# Patient Record
Sex: Male | Born: 1945
Health system: Southern US, Community
[De-identification: ages and names within clinical notes are randomized; demographics above are authoritative.]

## PROBLEM LIST (undated history)

## (undated) DIAGNOSIS — H269 Unspecified cataract: Secondary | ICD-10-CM

## (undated) DIAGNOSIS — J189 Pneumonia, unspecified organism: Secondary | ICD-10-CM

## (undated) DIAGNOSIS — M1990 Unspecified osteoarthritis, unspecified site: Secondary | ICD-10-CM

## (undated) DIAGNOSIS — C4442 Squamous cell carcinoma of skin of scalp and neck: Secondary | ICD-10-CM

## (undated) DIAGNOSIS — I10 Essential (primary) hypertension: Secondary | ICD-10-CM

## (undated) DIAGNOSIS — J449 Chronic obstructive pulmonary disease, unspecified: Secondary | ICD-10-CM

## (undated) DIAGNOSIS — R011 Cardiac murmur, unspecified: Secondary | ICD-10-CM

## (undated) DIAGNOSIS — K219 Gastro-esophageal reflux disease without esophagitis: Secondary | ICD-10-CM

## (undated) DIAGNOSIS — E7800 Pure hypercholesterolemia, unspecified: Secondary | ICD-10-CM

## (undated) DIAGNOSIS — I38 Endocarditis, valve unspecified: Secondary | ICD-10-CM

## (undated) DIAGNOSIS — K429 Umbilical hernia without obstruction or gangrene: Secondary | ICD-10-CM

## (undated) DIAGNOSIS — G4730 Sleep apnea, unspecified: Secondary | ICD-10-CM

## (undated) DIAGNOSIS — C801 Malignant (primary) neoplasm, unspecified: Secondary | ICD-10-CM

## (undated) DIAGNOSIS — I5189 Other ill-defined heart diseases: Secondary | ICD-10-CM

## (undated) DIAGNOSIS — I351 Nonrheumatic aortic (valve) insufficiency: Secondary | ICD-10-CM

## (undated) DIAGNOSIS — N400 Enlarged prostate without lower urinary tract symptoms: Secondary | ICD-10-CM

## (undated) HISTORY — DX: Nonrheumatic aortic (valve) insufficiency: I35.1

## (undated) HISTORY — PX: INGUINAL HERNIA REPAIR: SUR1180

## (undated) HISTORY — PX: UMBILICAL HERNIA REPAIR: SHX196

## (undated) HISTORY — PX: EYE SURGERY: SHX253

## (undated) HISTORY — PX: TONSILLECTOMY: SUR1361

---

## 2007-12-21 DIAGNOSIS — I38 Endocarditis, valve unspecified: Secondary | ICD-10-CM

## 2007-12-21 HISTORY — DX: Endocarditis, valve unspecified: I38

## 2012-07-24 DIAGNOSIS — Z1389 Encounter for screening for other disorder: Secondary | ICD-10-CM | POA: Diagnosis not present

## 2012-07-24 DIAGNOSIS — L578 Other skin changes due to chronic exposure to nonionizing radiation: Secondary | ICD-10-CM | POA: Diagnosis not present

## 2012-07-24 DIAGNOSIS — L570 Actinic keratosis: Secondary | ICD-10-CM | POA: Diagnosis not present

## 2012-07-24 DIAGNOSIS — L820 Inflamed seborrheic keratosis: Secondary | ICD-10-CM | POA: Diagnosis not present

## 2012-09-14 DIAGNOSIS — R972 Elevated prostate specific antigen [PSA]: Secondary | ICD-10-CM | POA: Diagnosis not present

## 2012-09-20 DIAGNOSIS — R972 Elevated prostate specific antigen [PSA]: Secondary | ICD-10-CM | POA: Diagnosis not present

## 2012-09-20 DIAGNOSIS — I359 Nonrheumatic aortic valve disorder, unspecified: Secondary | ICD-10-CM | POA: Diagnosis not present

## 2012-09-20 DIAGNOSIS — Z23 Encounter for immunization: Secondary | ICD-10-CM | POA: Diagnosis not present

## 2012-09-20 DIAGNOSIS — E785 Hyperlipidemia, unspecified: Secondary | ICD-10-CM | POA: Diagnosis not present

## 2012-09-20 DIAGNOSIS — N400 Enlarged prostate without lower urinary tract symptoms: Secondary | ICD-10-CM | POA: Diagnosis not present

## 2012-10-09 DIAGNOSIS — I359 Nonrheumatic aortic valve disorder, unspecified: Secondary | ICD-10-CM | POA: Diagnosis not present

## 2012-10-09 DIAGNOSIS — Z23 Encounter for immunization: Secondary | ICD-10-CM | POA: Diagnosis not present

## 2012-10-09 DIAGNOSIS — E7800 Pure hypercholesterolemia, unspecified: Secondary | ICD-10-CM | POA: Diagnosis not present

## 2012-10-09 DIAGNOSIS — I10 Essential (primary) hypertension: Secondary | ICD-10-CM | POA: Diagnosis not present

## 2012-10-09 DIAGNOSIS — R972 Elevated prostate specific antigen [PSA]: Secondary | ICD-10-CM | POA: Diagnosis not present

## 2012-10-10 DIAGNOSIS — I359 Nonrheumatic aortic valve disorder, unspecified: Secondary | ICD-10-CM | POA: Diagnosis not present

## 2012-10-10 DIAGNOSIS — I10 Essential (primary) hypertension: Secondary | ICD-10-CM | POA: Diagnosis not present

## 2012-10-10 DIAGNOSIS — E7800 Pure hypercholesterolemia, unspecified: Secondary | ICD-10-CM | POA: Diagnosis not present

## 2012-10-10 DIAGNOSIS — R972 Elevated prostate specific antigen [PSA]: Secondary | ICD-10-CM | POA: Diagnosis not present

## 2012-10-20 DIAGNOSIS — R0989 Other specified symptoms and signs involving the circulatory and respiratory systems: Secondary | ICD-10-CM | POA: Diagnosis not present

## 2012-11-21 DIAGNOSIS — R609 Edema, unspecified: Secondary | ICD-10-CM | POA: Diagnosis not present

## 2012-11-21 DIAGNOSIS — I369 Nonrheumatic tricuspid valve disorder, unspecified: Secondary | ICD-10-CM | POA: Diagnosis not present

## 2012-11-21 DIAGNOSIS — R079 Chest pain, unspecified: Secondary | ICD-10-CM | POA: Diagnosis not present

## 2012-11-21 DIAGNOSIS — I059 Rheumatic mitral valve disease, unspecified: Secondary | ICD-10-CM | POA: Diagnosis not present

## 2012-11-21 DIAGNOSIS — I061 Rheumatic aortic insufficiency: Secondary | ICD-10-CM | POA: Diagnosis not present

## 2012-12-08 DIAGNOSIS — R972 Elevated prostate specific antigen [PSA]: Secondary | ICD-10-CM | POA: Diagnosis not present

## 2012-12-28 DIAGNOSIS — J342 Deviated nasal septum: Secondary | ICD-10-CM | POA: Diagnosis not present

## 2012-12-28 DIAGNOSIS — J343 Hypertrophy of nasal turbinates: Secondary | ICD-10-CM | POA: Diagnosis not present

## 2012-12-28 DIAGNOSIS — G4733 Obstructive sleep apnea (adult) (pediatric): Secondary | ICD-10-CM | POA: Diagnosis not present

## 2012-12-28 DIAGNOSIS — G4710 Hypersomnia, unspecified: Secondary | ICD-10-CM | POA: Diagnosis not present

## 2012-12-28 DIAGNOSIS — R0682 Tachypnea, not elsewhere classified: Secondary | ICD-10-CM | POA: Diagnosis not present

## 2013-01-02 DIAGNOSIS — N401 Enlarged prostate with lower urinary tract symptoms: Secondary | ICD-10-CM | POA: Diagnosis not present

## 2013-01-02 DIAGNOSIS — R972 Elevated prostate specific antigen [PSA]: Secondary | ICD-10-CM | POA: Diagnosis not present

## 2013-01-04 DIAGNOSIS — G4710 Hypersomnia, unspecified: Secondary | ICD-10-CM | POA: Diagnosis not present

## 2013-01-04 DIAGNOSIS — G4730 Sleep apnea, unspecified: Secondary | ICD-10-CM | POA: Diagnosis not present

## 2013-01-09 DIAGNOSIS — N400 Enlarged prostate without lower urinary tract symptoms: Secondary | ICD-10-CM | POA: Diagnosis not present

## 2013-01-09 DIAGNOSIS — I10 Essential (primary) hypertension: Secondary | ICD-10-CM | POA: Diagnosis not present

## 2013-01-09 DIAGNOSIS — G4739 Other sleep apnea: Secondary | ICD-10-CM | POA: Diagnosis not present

## 2013-01-09 DIAGNOSIS — I38 Endocarditis, valve unspecified: Secondary | ICD-10-CM | POA: Diagnosis not present

## 2013-01-09 DIAGNOSIS — J342 Deviated nasal septum: Secondary | ICD-10-CM | POA: Diagnosis not present

## 2013-01-09 DIAGNOSIS — I359 Nonrheumatic aortic valve disorder, unspecified: Secondary | ICD-10-CM | POA: Diagnosis not present

## 2013-01-23 DIAGNOSIS — G4733 Obstructive sleep apnea (adult) (pediatric): Secondary | ICD-10-CM | POA: Diagnosis not present

## 2013-01-23 DIAGNOSIS — J342 Deviated nasal septum: Secondary | ICD-10-CM | POA: Diagnosis not present

## 2013-01-30 DIAGNOSIS — Z1389 Encounter for screening for other disorder: Secondary | ICD-10-CM | POA: Diagnosis not present

## 2013-01-30 DIAGNOSIS — L570 Actinic keratosis: Secondary | ICD-10-CM | POA: Diagnosis not present

## 2013-01-30 DIAGNOSIS — G4739 Other sleep apnea: Secondary | ICD-10-CM | POA: Diagnosis not present

## 2013-01-30 DIAGNOSIS — J342 Deviated nasal septum: Secondary | ICD-10-CM | POA: Diagnosis not present

## 2013-01-30 DIAGNOSIS — L578 Other skin changes due to chronic exposure to nonionizing radiation: Secondary | ICD-10-CM | POA: Diagnosis not present

## 2013-02-26 DIAGNOSIS — J342 Deviated nasal septum: Secondary | ICD-10-CM | POA: Diagnosis not present

## 2013-02-26 DIAGNOSIS — J343 Hypertrophy of nasal turbinates: Secondary | ICD-10-CM | POA: Diagnosis not present

## 2013-02-26 DIAGNOSIS — G4730 Sleep apnea, unspecified: Secondary | ICD-10-CM | POA: Diagnosis not present

## 2013-03-30 DIAGNOSIS — IMO0002 Reserved for concepts with insufficient information to code with codable children: Secondary | ICD-10-CM | POA: Diagnosis not present

## 2013-03-30 DIAGNOSIS — R972 Elevated prostate specific antigen [PSA]: Secondary | ICD-10-CM | POA: Diagnosis not present

## 2013-04-10 DIAGNOSIS — E7800 Pure hypercholesterolemia, unspecified: Secondary | ICD-10-CM | POA: Diagnosis not present

## 2013-04-10 DIAGNOSIS — I359 Nonrheumatic aortic valve disorder, unspecified: Secondary | ICD-10-CM | POA: Diagnosis not present

## 2013-04-10 DIAGNOSIS — I10 Essential (primary) hypertension: Secondary | ICD-10-CM | POA: Diagnosis not present

## 2013-04-10 DIAGNOSIS — Z8679 Personal history of other diseases of the circulatory system: Secondary | ICD-10-CM | POA: Diagnosis not present

## 2013-04-16 DIAGNOSIS — R0989 Other specified symptoms and signs involving the circulatory and respiratory systems: Secondary | ICD-10-CM | POA: Diagnosis not present

## 2013-04-16 DIAGNOSIS — D485 Neoplasm of uncertain behavior of skin: Secondary | ICD-10-CM | POA: Diagnosis not present

## 2013-04-16 DIAGNOSIS — I359 Nonrheumatic aortic valve disorder, unspecified: Secondary | ICD-10-CM | POA: Diagnosis not present

## 2013-04-16 DIAGNOSIS — I10 Essential (primary) hypertension: Secondary | ICD-10-CM | POA: Diagnosis not present

## 2013-04-16 DIAGNOSIS — R0609 Other forms of dyspnea: Secondary | ICD-10-CM | POA: Diagnosis not present

## 2013-04-16 DIAGNOSIS — L570 Actinic keratosis: Secondary | ICD-10-CM | POA: Diagnosis not present

## 2013-04-16 DIAGNOSIS — E7800 Pure hypercholesterolemia, unspecified: Secondary | ICD-10-CM | POA: Diagnosis not present

## 2013-04-16 DIAGNOSIS — Z8679 Personal history of other diseases of the circulatory system: Secondary | ICD-10-CM | POA: Diagnosis not present

## 2013-04-20 DIAGNOSIS — G4733 Obstructive sleep apnea (adult) (pediatric): Secondary | ICD-10-CM | POA: Diagnosis not present

## 2013-04-26 DIAGNOSIS — I38 Endocarditis, valve unspecified: Secondary | ICD-10-CM | POA: Diagnosis not present

## 2013-04-26 DIAGNOSIS — Z6379 Other stressful life events affecting family and household: Secondary | ICD-10-CM | POA: Diagnosis not present

## 2013-04-26 DIAGNOSIS — R972 Elevated prostate specific antigen [PSA]: Secondary | ICD-10-CM | POA: Diagnosis not present

## 2013-04-26 DIAGNOSIS — I10 Essential (primary) hypertension: Secondary | ICD-10-CM | POA: Diagnosis not present

## 2013-04-26 DIAGNOSIS — I359 Nonrheumatic aortic valve disorder, unspecified: Secondary | ICD-10-CM | POA: Diagnosis not present

## 2013-04-26 DIAGNOSIS — G4739 Other sleep apnea: Secondary | ICD-10-CM | POA: Diagnosis not present

## 2013-04-26 DIAGNOSIS — E785 Hyperlipidemia, unspecified: Secondary | ICD-10-CM | POA: Diagnosis not present

## 2013-05-02 DIAGNOSIS — L570 Actinic keratosis: Secondary | ICD-10-CM | POA: Diagnosis not present

## 2013-05-02 DIAGNOSIS — L259 Unspecified contact dermatitis, unspecified cause: Secondary | ICD-10-CM | POA: Diagnosis not present

## 2013-05-02 DIAGNOSIS — L821 Other seborrheic keratosis: Secondary | ICD-10-CM | POA: Diagnosis not present

## 2013-05-02 DIAGNOSIS — D485 Neoplasm of uncertain behavior of skin: Secondary | ICD-10-CM | POA: Diagnosis not present

## 2013-05-10 DIAGNOSIS — L570 Actinic keratosis: Secondary | ICD-10-CM | POA: Diagnosis not present

## 2013-05-17 DIAGNOSIS — D239 Other benign neoplasm of skin, unspecified: Secondary | ICD-10-CM | POA: Diagnosis not present

## 2013-07-02 DIAGNOSIS — L989 Disorder of the skin and subcutaneous tissue, unspecified: Secondary | ICD-10-CM | POA: Diagnosis not present

## 2013-07-02 DIAGNOSIS — M6281 Muscle weakness (generalized): Secondary | ICD-10-CM | POA: Diagnosis not present

## 2013-08-16 ENCOUNTER — Inpatient Hospital Stay (HOSPITAL_COMMUNITY): Payer: Medicare Other

## 2013-08-16 ENCOUNTER — Emergency Department (HOSPITAL_COMMUNITY): Payer: Medicare Other

## 2013-08-16 ENCOUNTER — Encounter (HOSPITAL_COMMUNITY): Payer: Self-pay | Admitting: *Deleted

## 2013-08-16 ENCOUNTER — Inpatient Hospital Stay (HOSPITAL_COMMUNITY)
Admission: EM | Admit: 2013-08-16 | Discharge: 2013-08-19 | DRG: 289 | Disposition: A | Discharge status: Home Health | Payer: Medicare Other | Attending: Internal Medicine | Admitting: Internal Medicine

## 2013-08-16 DIAGNOSIS — I10 Essential (primary) hypertension: Secondary | ICD-10-CM | POA: Diagnosis present

## 2013-08-16 DIAGNOSIS — N400 Enlarged prostate without lower urinary tract symptoms: Secondary | ICD-10-CM | POA: Diagnosis present

## 2013-08-16 DIAGNOSIS — I38 Endocarditis, valve unspecified: Secondary | ICD-10-CM | POA: Diagnosis not present

## 2013-08-16 DIAGNOSIS — G4733 Obstructive sleep apnea (adult) (pediatric): Secondary | ICD-10-CM | POA: Diagnosis present

## 2013-08-16 DIAGNOSIS — R059 Cough, unspecified: Secondary | ICD-10-CM | POA: Diagnosis not present

## 2013-08-16 DIAGNOSIS — R61 Generalized hyperhidrosis: Secondary | ICD-10-CM | POA: Diagnosis not present

## 2013-08-16 DIAGNOSIS — E785 Hyperlipidemia, unspecified: Secondary | ICD-10-CM | POA: Diagnosis present

## 2013-08-16 DIAGNOSIS — R5381 Other malaise: Secondary | ICD-10-CM | POA: Diagnosis not present

## 2013-08-16 DIAGNOSIS — I359 Nonrheumatic aortic valve disorder, unspecified: Secondary | ICD-10-CM | POA: Diagnosis present

## 2013-08-16 DIAGNOSIS — R509 Fever, unspecified: Secondary | ICD-10-CM | POA: Diagnosis not present

## 2013-08-16 DIAGNOSIS — I330 Acute and subacute infective endocarditis: Principal | ICD-10-CM | POA: Diagnosis present

## 2013-08-16 DIAGNOSIS — R05 Cough: Secondary | ICD-10-CM | POA: Diagnosis not present

## 2013-08-16 DIAGNOSIS — E7800 Pure hypercholesterolemia, unspecified: Secondary | ICD-10-CM | POA: Diagnosis present

## 2013-08-16 DIAGNOSIS — Z79899 Other long term (current) drug therapy: Secondary | ICD-10-CM | POA: Diagnosis not present

## 2013-08-16 DIAGNOSIS — R5383 Other fatigue: Secondary | ICD-10-CM

## 2013-08-16 DIAGNOSIS — I351 Nonrheumatic aortic (valve) insufficiency: Secondary | ICD-10-CM | POA: Diagnosis present

## 2013-08-16 DIAGNOSIS — R6884 Jaw pain: Secondary | ICD-10-CM | POA: Diagnosis not present

## 2013-08-16 HISTORY — DX: Essential (primary) hypertension: I10

## 2013-08-16 HISTORY — DX: Benign prostatic hyperplasia without lower urinary tract symptoms: N40.0

## 2013-08-16 HISTORY — DX: Endocarditis, valve unspecified: I38

## 2013-08-16 HISTORY — DX: Cardiac murmur, unspecified: R01.1

## 2013-08-16 HISTORY — DX: Pure hypercholesterolemia, unspecified: E78.00

## 2013-08-16 HISTORY — DX: Pneumonia, unspecified organism: J18.9

## 2013-08-16 HISTORY — DX: Sleep apnea, unspecified: G47.30

## 2013-08-16 LAB — COMPREHENSIVE METABOLIC PANEL
ALT: 26 U/L (ref 0–53)
AST: 26 U/L (ref 0–37)
Albumin: 4.1 g/dL (ref 3.5–5.2)
Alkaline Phosphatase: 46 U/L (ref 39–117)
BUN: 21 mg/dL (ref 6–23)
Chloride: 98 mEq/L (ref 96–112)
Potassium: 3.6 mEq/L (ref 3.5–5.1)
Sodium: 136 mEq/L (ref 135–145)
Total Bilirubin: 1.2 mg/dL (ref 0.3–1.2)
Total Protein: 7.1 g/dL (ref 6.0–8.3)

## 2013-08-16 LAB — URINALYSIS, ROUTINE W REFLEX MICROSCOPIC
Bilirubin Urine: NEGATIVE
Glucose, UA: NEGATIVE mg/dL
Hgb urine dipstick: NEGATIVE
Ketones, ur: NEGATIVE mg/dL
Leukocytes, UA: NEGATIVE
Protein, ur: NEGATIVE mg/dL
pH: 6 (ref 5.0–8.0)

## 2013-08-16 LAB — C-REACTIVE PROTEIN: CRP: 5.3 mg/dL — ABNORMAL HIGH (ref <0.60)

## 2013-08-16 LAB — CREATININE, SERUM
Creatinine, Ser: 1.26 mg/dL (ref 0.50–1.35)
GFR calc Af Amer: 67 mL/min — ABNORMAL LOW (ref >90)
GFR calc non Af Amer: 58 mL/min — ABNORMAL LOW (ref >90)

## 2013-08-16 LAB — CBC WITH DIFFERENTIAL/PLATELET
Basophils Relative: 0 % (ref 0–1)
Hemoglobin: 16.1 g/dL (ref 13.0–17.0)
MCHC: 36.9 g/dL — ABNORMAL HIGH (ref 30.0–36.0)
Monocytes Relative: 10 % (ref 3–12)
Neutro Abs: 7.8 10*3/uL — ABNORMAL HIGH (ref 1.7–7.7)
Neutrophils Relative %: 83 % — ABNORMAL HIGH (ref 43–77)
Platelets: 113 10*3/uL — ABNORMAL LOW (ref 150–400)
RBC: 4.86 MIL/uL (ref 4.22–5.81)

## 2013-08-16 LAB — PROTIME-INR
INR: 1.05 (ref 0.00–1.49)
Prothrombin Time: 13.5 seconds (ref 11.6–15.2)

## 2013-08-16 LAB — SEDIMENTATION RATE: Sed Rate: 18 mm/hr — ABNORMAL HIGH (ref 0–16)

## 2013-08-16 LAB — CBC
HCT: 40.4 % (ref 39.0–52.0)
Hemoglobin: 14.1 g/dL (ref 13.0–17.0)
MCHC: 34.9 g/dL (ref 30.0–36.0)
RBC: 4.49 MIL/uL (ref 4.22–5.81)
WBC: 6.8 10*3/uL (ref 4.0–10.5)

## 2013-08-16 LAB — RHEUMATOID FACTOR: Rhuematoid fact SerPl-aCnc: 21 IU/mL — ABNORMAL HIGH (ref <=14)

## 2013-08-16 LAB — TROPONIN I: Troponin I: <0.3 ng/mL (ref <0.30)

## 2013-08-16 MED ORDER — POLYETHYLENE GLYCOL 3350 17 G PO PACK
17.0000 g | PACK | Freq: Every day | ORAL | Status: DC | PRN
  Filled 2013-08-16: qty 1

## 2013-08-16 MED ORDER — HEPARIN SODIUM (PORCINE) 5000 UNIT/ML IJ SOLN
5000.0000 [IU] | Freq: Three times a day (TID) | INTRAMUSCULAR | Status: DC
  Administered 2013-08-16 – 2013-08-19 (×8): 5000 [IU] via SUBCUTANEOUS
  Filled 2013-08-16 (×12): qty 1

## 2013-08-16 MED ORDER — ACETAMINOPHEN 325 MG PO TABS
650.0000 mg | ORAL_TABLET | Freq: Four times a day (QID) | ORAL | Status: DC | PRN
  Administered 2013-08-16 – 2013-08-19 (×3): 650 mg via ORAL
  Filled 2013-08-16 (×3): qty 2

## 2013-08-16 MED ORDER — IRBESARTAN 150 MG PO TABS
150.0000 mg | ORAL_TABLET | Freq: Every day | ORAL | Status: DC
  Administered 2013-08-17 – 2013-08-19 (×3): 150 mg via ORAL
  Filled 2013-08-16 (×3): qty 1

## 2013-08-16 MED ORDER — ALBUTEROL SULFATE (5 MG/ML) 0.5% IN NEBU: 2.5000 mg | INHALATION_SOLUTION | RESPIRATORY_TRACT | Status: DC | PRN

## 2013-08-16 MED ORDER — HYDROCHLOROTHIAZIDE 12.5 MG PO CAPS
12.5000 mg | ORAL_CAPSULE | Freq: Every day | ORAL | Status: DC
  Administered 2013-08-17 – 2013-08-19 (×3): 12.5 mg via ORAL
  Filled 2013-08-16 (×3): qty 1

## 2013-08-16 MED ORDER — SODIUM CHLORIDE 0.9 % IJ SOLN: 3.0000 mL | Freq: Two times a day (BID) | INTRAMUSCULAR | Status: DC

## 2013-08-16 MED ORDER — POTASSIUM CHLORIDE IN NACL 20-0.9 MEQ/L-% IV SOLN
INTRAVENOUS | Status: AC
  Administered 2013-08-16 – 2013-08-17 (×2): via INTRAVENOUS
  Filled 2013-08-16 (×3): qty 1000

## 2013-08-16 MED ORDER — ONDANSETRON HCL 4 MG PO TABS: 4.0000 mg | ORAL_TABLET | Freq: Four times a day (QID) | ORAL | Status: DC | PRN

## 2013-08-16 MED ORDER — TELMISARTAN-HCTZ 40-12.5 MG PO TABS: 1.0000 | ORAL_TABLET | Freq: Every day | ORAL | Status: DC

## 2013-08-16 MED ORDER — ATORVASTATIN CALCIUM 20 MG PO TABS
20.0000 mg | ORAL_TABLET | Freq: Every day | ORAL | Status: DC
  Administered 2013-08-17 – 2013-08-19 (×3): 20 mg via ORAL
  Filled 2013-08-16 (×4): qty 1

## 2013-08-16 MED ORDER — ONDANSETRON HCL 4 MG/2ML IJ SOLN: 4.0000 mg | Freq: Four times a day (QID) | INTRAMUSCULAR | Status: DC | PRN

## 2013-08-16 MED ORDER — GUAIFENESIN-DM 100-10 MG/5ML PO SYRP: 5.0000 mL | ORAL_SOLUTION | ORAL | Status: DC | PRN

## 2013-08-16 MED ORDER — HYDROCODONE-ACETAMINOPHEN 5-325 MG PO TABS: 1.0000 | ORAL_TABLET | ORAL | Status: DC | PRN

## 2013-08-16 NOTE — Consult Note (Signed)
 Admit date: 08/16/2013 Referring Physician  [facility] Primary Physician  Eric Ross, MD Primary Cardiologist  Eric Skains, MD Reason for Consultation  Chills, anorexia, and prior h/o endocarditis  ASSESSMENT: 1. Chills, sweats, malaise of uncertain cause. Prior history of endocarditis  2. Prior aortic valve endocarditis 2008. Moderate aortic regurgitation by echo, most recently April 2014  3. Hypertension  4. C6 osteomyelitis, complicating aortic valve endocarditis 2008  PLAN:  1. A least 3 sets of blood cultures should be performed  2. 2-D Doppler echocardiogram  3. Sedimentation rate, rheumatoid factor  4. Dr. Skains will followup with the patient in a.m.   HPI: 67-year-old gentleman with a 2 week history of lassitude, anorexia, and chills. He became concerned because these symptoms remind him of those that preceded the diagnosis of aortic valve endocarditis in 2008. He had dental work one month ago preceded by antibiotic prophylaxis (amoxicillin). He denies fever, muscle aches, joint discomfort, rash, chest pain, dyspnea, and edema.   PMH:   Past Medical History  Diagnosis Date  . Endocarditis   . High cholesterol   . Hypertension   . Enlarged prostate      PSH:  History reviewed. No pertinent past surgical history.  Allergies:  Review of patient's allergies indicates no known allergies. Prior to Admit Meds:   (Not in a hospital admission) Fam HX:   History reviewed. No pertinent family history. Social HX:    History   Social History  . Marital Status: Married    Spouse Name: N/A    Number of Children: N/A  . Years of Education: N/A   Occupational History  . Not on file.   Social History Main Topics  . Smoking status: Never Smoker   . Smokeless tobacco: Not on file  . Alcohol Use: Yes     Comment: occ wine  . Drug Use: No  . Sexual Activity: Not on file   Other Topics Concern  . Not on file   Social History Narrative  . No narrative on  file     Review of Systems: Recent dental work approximately 4 weeks ago. Antibiotic prophylaxis prior to the procedure. Denies joint effusions, rash, fever, weight loss, palpitations, neck and back pain  Physical Exam: Blood pressure 106/55, pulse 81, temperature 99.4 F (37.4 C), temperature source Oral, resp. rate 18, height 5' 9" (1.753 m), weight 94.348 kg (208 lb), SpO2 95.00%. Weight change:    Skin is warm and dry. The patient does not appear chronically ill.  Neck veins are nondistended. Carotid upstroke is normal.  Chest is clear to auscultation and percussion.  Cardiac exam reveals a high-pitched decrescendo murmur of aortic regurgitation grade 2-3/6. Murmurs heard loudest at the left midsternal border. An S4 gallop is audible. No significant systolic murmurs heard.  Skin reveals no rashes. There are no nailbed or palmar findings.  Extremities reveal no edema. No joint effusions or tenderness is noted.   Abdomen is soft. Bowel sounds are normal.  Neurological exam is intact to  Labs:   Lab Results  Component Value Date   WBC 9.4 08/16/2013   HGB 16.1 08/16/2013   HCT 43.6 08/16/2013   MCV 89.7 08/16/2013   PLT 113* 08/16/2013    Recent Labs Lab 08/16/13 1246  NA 136  K 3.6  CL 98  CO2 27  BUN 21  CREATININE 1.17  CALCIUM 9.4  PROT 7.1  BILITOT 1.2  ALKPHOS 46  ALT 26  AST 26  GLUCOSE 118*     No results found for this basename: PTT   No results found for this basename: INR, PROTIME   Lab Results  Component Value Date   TROPONINI <0.30 08/16/2013       Radiology:  Dg Chest 2 View  08/16/2013   *RADIOLOGY REPORT*  Clinical Data: Cough and chills.  CHEST - 2 VIEW  Comparison: None  Findings: The cardiac silhouette, mediastinal and hilar contours are within normal limits.  The lungs are clear.  No pleural effusion.  The bony thorax is intact.  IMPRESSION: No acute cardiopulmonary findings.   Original Report Authenticated By: P. Gallerani, M.D.   EKG:   Right ventricular conduction delay/right bundle branch block. Normal sinus rhythm. When compared to all his EKGs axis is more leftward    [name],Eric Chambers 08/16/2013 3:11 PM   

## 2013-08-16 NOTE — ED Notes (Signed)
Pt sent here from [facility]. Reports having fever/chills, nausea and non productive cough for several days. Had similar symptoms in 2009 and was diagnosed then with endocarditis. No acute distress noted at triage.

## 2013-08-16 NOTE — ED Notes (Signed)
Patient transported to X-ray 

## 2013-08-16 NOTE — ED Provider Notes (Signed)
TIME SEEN: 11:52 AM  CHIEF COMPLAINT: Night sweats, chills, nausea, cough  HPI: Patient is a 67 year old male with a history of hypertension, hyperlipidemia, prior endocarditis in 2009 were he was treated at a hospital in [place] who presents the emergency department from his primary care physician's office for further evaluation for one month of increased fatigue, chills, night sweats, nausea, productive cough. Patient reports had similar symptoms with his prior endocarditis. He has no history of HIV, diabetes. No history of IV drug use. He reports recent crown placement a month ago. Denies any headache, neck pain or neck stiffness, ear pain or sore throat, vomiting or diarrhea, rash, tick bites. No sick contacts, recent travel. No chest pain or shortness of breath. He reports that his prior history of endocarditis, he did not have an aortic valve replacement. He receives echocardiograms every 6 months. He takes amoxicillin before any dental work. No history of tuberculosis. No hemoptysis.  PCP is Ross with [facility] [employer] [facility]  ROS: See HPI Constitutional: no fever  Eyes: no drainage  ENT: no runny nose   Cardiovascular:  no chest pain  Resp: no SOB  GI: no vomiting GU: no dysuria Integumentary: no rash  Allergy: no hives  Musculoskeletal: no leg swelling  Neurological: no slurred speech ROS otherwise negative  PAST MEDICAL HISTORY/PAST SURGICAL HISTORY:  Past Medical History  Diagnosis Date  . Endocarditis   . High cholesterol   . Hypertension   . Enlarged prostate     MEDICATIONS:  Prior to Admission medications   Not on File    ALLERGIES:  No Known Allergies  SOCIAL HISTORY:  History  Substance Use Topics  . Smoking status: Never Smoker   . Smokeless tobacco: Not on file  . Alcohol Use: Yes     Comment: occ wine    FAMILY HISTORY: History reviewed. No pertinent family history.  EXAM: BP 127/48  Pulse 73  Temp(Src) 98.6 F (37 C) (Oral)  Resp  18  Ht 5\' 9"  (1.753 m)  Wt 208 lb (94.348 kg)  BMI 30.7 kg/m2  SpO2 96% CONSTITUTIONAL: Alert and oriented and responds appropriately to questions. Well-appearing; well-nourished HEAD: Normocephalic EYES: Conjunctivae clear, PERRL ENT: normal nose; no rhinorrhea; moist mucous membranes; pharynx without lesions noted, TMs are clear bilaterally NECK: Supple, no meningismus, no LAD  CARD: RRR; S1 and S2 appreciated; no murmurs, no clicks, no rubs, no gallops RESP: Normal chest excursion without splinting or tachypnea; breath sounds clear and equal bilaterally; no wheezes, no rhonchi, no rales,  ABD/GI: Normal bowel sounds; non-distended; soft, non-tender, no rebound, no guarding BACK:  The back appears normal and is non-tender to palpation, there is no CVA tenderness EXT: Normal ROM in all joints; non-tender to palpation; no edema; normal capillary refill; no cyanosis    SKIN: Normal color for age and race; warm, no rash or lesions noted on his hands, feet or nails NEURO: Moves all extremities equally PSYCH: The patient's mood and manner are appropriate. Grooming and personal hygiene are appropriate.  MEDICAL DECISION MAKING: Patient with a history of endocarditis who presents with a month of chills, night sweats, nausea, cough. Will obtain labs, cultures, urine, chest x-ray. He is currently hemodynamically stable. Patient will need admission for echocardiogram. Will hold antibiotics at this time.   Date: 08/16/2013 11:46 AM  Rate: 78  Rhythm: normal sinus rhythm  QRS Axis: normal  Intervals: Incomplete right bundle branch block  ST/T Wave abnormalities: normal  Conduction Disutrbances: none  Narrative  Interpretation: Incomplete right bundle branch block, right ventricular hypertrophy, no old for comparison     ED PROGRESS: Patient's labs are unremarkable other than a slight left shift. Cultures pending. Chest x-ray urinalysis shows no obvious source of infection. Troponin negative.  Will discuss with hospitalist for admission.  With hospitalist for admission. Will consult cardiology for stat echocardiogram. Hospitalist decided patient antiemetics at this time. He still hemodynamically stable. Updated patient and family.  Layla Maw Ward, DO 08/16/13 1425

## 2013-08-16 NOTE — H&P (Signed)
[facility]                                                                                    Patient Demographics  Eric Chambers, is a 67 y.o. male  CSN: [id number]  MRN: [id number]  DOB - 10-19-1946  Admit Date - 08/16/2013  Outpatient Primary MD for the patient is [facility], MD   With History of -  Past Medical History  Diagnosis Date  . Endocarditis   . High cholesterol   . Hypertension   . Enlarged prostate       History reviewed. No pertinent past surgical history.  in for   Chief Complaint  Patient presents with  . Cough  . Chills     HPI  Eric Chambers  is a 67 y.o. male, Who is in the process of moving from [place] with history of endocarditis in 2009 for which he was treated in [place] with IV antibiotics for 6 weeks, says he got the infection after dental work, hypertension, dyslipidemia, obstructive sleep apnea does not wear CPAP.  She with above history who was in his usual state of health till he had some crown work done to his teeth about 5 weeks ago and subsequently started experiencing low-grade fever chills, generalized fatigue and night sweats, also complains of some unintentional weight loss, symptoms have gotten worse over the last week, he at best has a mild dry cough but no other subjective complaints, he says his symptoms are similar to his endocarditis episode in 2009, presented to the ER where he has a temperature of 99.4, initial blood work along with chest x-ray and UA are unremarkable, I was called to admit the patient for possible endocarditis.    Review of Systems    In addition to the HPI above, + Fever-chills,+ve night sweats No Headache, No changes with Vision or hearing, No problems swallowing food or Liquids, No Chest pain, No Shortness of Breath, Mild dry cough No Abdominal pain, No Nausea or Vommitting, Bowel movements are regular, No Blood in stool or Urine, No dysuria, No new skin rashes or bruises, No new  joints pains-aches,  No new weakness, tingling, numbness in any extremity, No recent weight gain   No polyuria, polydypsia or polyphagia, No significant Mental Stressors.  A full 10 point Review of Systems was done, except as stated above, all other Review of Systems were negative.   Social History History  Substance Use Topics  . Smoking status: Never Smoker   . Smokeless tobacco: Not on file  . Alcohol Use: Yes     Comment: occ wine      Family History CAD in his father died of MI in his 105s  Prior to Admission medications   Medication Sig Start Date End Date Taking? Authorizing Provider  atorvastatin (LIPITOR) 20 MG tablet Take 20 mg by mouth daily.   Yes Historical Provider, MD  telmisartan-hydrochlorothiazide (MICARDIS HCT) 40-12.5 MG per tablet Take 1 tablet by mouth daily.   Yes Historical Provider, MD    No Known Allergies  Physical Exam  Vitals  Blood pressure 106/55, pulse 81, temperature 99.4 F (37.4 C), temperature source Oral, resp.  rate 18, height 5\' 9"  (1.753 m), weight 94.348 kg (208 lb), SpO2 95.00%.   1. General  Middle-aged white male lying in bed in NAD,    2. Normal affect and insight, Not Suicidal or Homicidal, Awake Alert, Oriented X 3.  3. No F.N deficits, ALL C.Nerves Intact, Strength 5/5 all 4 extremities, Sensation intact all 4 extremities, Plantars down going.  4. Ears and Eyes appear Normal, Conjunctivae clear, PERRLA. Moist Oral Mucosa.  5. Supple Neck, No JVD, No cervical lymphadenopathy appriciated, No Carotid Bruits.  6. Symmetrical Chest wall movement, Good air movement bilaterally, CTAB.  7. RRR, No Gallops, Rubs or Murmurs, No Parasternal Heave.  8. Positive Bowel Sounds, Abdomen Soft, Non tender, No organomegaly appriciated,No rebound -guarding or rigidity.  9.  No Cyanosis, Normal Skin Turgor, No Skin Rash or Bruise.  10. Good muscle tone,  joints appear normal , no effusions, Normal ROM.  11. No Palpable Lymph Nodes in  Neck or Axillae     Data Review  CBC  Recent Labs Lab 08/16/13 1246  WBC 9.4  HGB 16.1  HCT 43.6  PLT 113*  MCV 89.7  MCH 33.1  MCHC 36.9*  RDW 13.0  LYMPHSABS 0.6*  MONOABS 1.0  EOSABS 0.0  BASOSABS 0.0   ------------------------------------------------------------------------------------------------------------------  Chemistries   Recent Labs Lab 08/16/13 1246  NA 136  K 3.6  CL 98  CO2 27  GLUCOSE 118*  BUN 21  CREATININE 1.17  CALCIUM 9.4  AST 26  ALT 26  ALKPHOS 46  BILITOT 1.2   ------------------------------------------------------------------------------------------------------------------ estimated creatinine clearance is 70.4 ml/min (by C-G formula based on Cr of 1.17). ------------------------------------------------------------------------------------------------------------------ No results found for this basename: TSH, T4TOTAL, FREET3, T3FREE, THYROIDAB,  in the last 72 hours   Coagulation profile No results found for this basename: INR, PROTIME,  in the last 168 hours ------------------------------------------------------------------------------------------------------------------- No results found for this basename: DDIMER,  in the last 72 hours -------------------------------------------------------------------------------------------------------------------  Cardiac Enzymes  Recent Labs Lab 08/16/13 1246  TROPONINI <0.30   ------------------------------------------------------------------------------------------------------------------ No components found with this basename: POCBNP,    ---------------------------------------------------------------------------------------------------------------  Urinalysis    Component Value Date/Time   COLORURINE YELLOW 08/16/2013 1300   APPEARANCEUR CLEAR 08/16/2013 1300   LABSPEC 1.018 08/16/2013 1300   PHURINE 6.0 08/16/2013 1300   GLUCOSEU NEGATIVE 08/16/2013 1300   HGBUR NEGATIVE  08/16/2013 1300   BILIRUBINUR NEGATIVE 08/16/2013 1300   KETONESUR NEGATIVE 08/16/2013 1300   PROTEINUR NEGATIVE 08/16/2013 1300   UROBILINOGEN 0.2 08/16/2013 1300   NITRITE NEGATIVE 08/16/2013 1300   LEUKOCYTESUR NEGATIVE 08/16/2013 1300    ----------------------------------------------------------------------------------------------------------------  Imaging results:   Dg Chest 2 View  08/16/2013   *RADIOLOGY REPORT*  Clinical Data: Cough and chills.  CHEST - 2 VIEW  Comparison: None  Findings: The cardiac silhouette, mediastinal and hilar contours are within normal limits.  The lungs are clear.  No pleural effusion.  The bony thorax is intact.  IMPRESSION: No acute cardiopulmonary findings.   Original Report Authenticated By: Rudie Meyer, M.D.    My personal review of EKG: Rhythm NSR,   Non specific ST changes    Assessment & Plan   1. Fevers of unclear origin, patient history of endocarditis and recent dental work. Symptoms suspicious for endocarditis, they're ongoing for a month, I have discussed his case with both infectious disease Dr. Algis Liming and cardiologist Dr. Donato Schultz, plan is to admit the patient,Blood cultures now and in 6 hours, ESR, CRP, will obtain serum quanteferon as patient has extensive  travel history. TEE will be scheduled for tomorrow tentatively at 2 PM, n.p.o. After midnight. For now no antibiotics per ID. We will monitor and follow their recommendations.   2. Dyslipidemia and hypertension home medications will be continued.    3.Obstructive sleep apnea does not wear CPAP, oxygen if needed at nighttime.We'll defer to PCP post discharge.    DVT Prophylaxis Heparin   AM Labs Ordered, also please review Full Orders  Family Communication: Admission, patients condition and plan of care including tests being ordered have been discussed with the patient and wife who indicate understanding and agree with the plan and Code Status.  Code Status Full  Likely DC  to  Home  Time spent in minutes : 35  Condition Marinell Blight K M.D on 08/16/2013 at 2:42 PM  Between 7am to 7pm - Pager - [phone]  After 7pm go to [email or web address] - password [email or web address]  And look for the night coverage person covering me after hours  [facility] Office  [phone]

## 2013-08-17 ENCOUNTER — Encounter (HOSPITAL_COMMUNITY): Payer: Self-pay | Admitting: Infectious Diseases

## 2013-08-17 ENCOUNTER — Encounter (HOSPITAL_COMMUNITY)
Admission: EM | Disposition: A | Discharge status: Home Health | Payer: Self-pay | Source: Home / Self Care | Attending: Internal Medicine

## 2013-08-17 DIAGNOSIS — E7800 Pure hypercholesterolemia, unspecified: Secondary | ICD-10-CM | POA: Diagnosis not present

## 2013-08-17 DIAGNOSIS — G4733 Obstructive sleep apnea (adult) (pediatric): Secondary | ICD-10-CM

## 2013-08-17 DIAGNOSIS — I38 Endocarditis, valve unspecified: Secondary | ICD-10-CM | POA: Diagnosis not present

## 2013-08-17 DIAGNOSIS — I359 Nonrheumatic aortic valve disorder, unspecified: Secondary | ICD-10-CM | POA: Diagnosis not present

## 2013-08-17 DIAGNOSIS — R509 Fever, unspecified: Secondary | ICD-10-CM | POA: Diagnosis not present

## 2013-08-17 HISTORY — DX: Endocarditis, valve unspecified: I38

## 2013-08-17 HISTORY — PX: TEE WITHOUT CARDIOVERSION: SHX5443

## 2013-08-17 LAB — TSH: TSH: 1.663 u[IU]/mL (ref 0.350–4.500)

## 2013-08-17 LAB — COMPREHENSIVE METABOLIC PANEL
ALT: 21 U/L (ref 0–53)
AST: 19 U/L (ref 0–37)
Albumin: 3.4 g/dL — ABNORMAL LOW (ref 3.5–5.2)
Calcium: 8.6 mg/dL (ref 8.4–10.5)
Sodium: 140 mEq/L (ref 135–145)
Total Protein: 5.9 g/dL — ABNORMAL LOW (ref 6.0–8.3)

## 2013-08-17 LAB — CBC
MCH: 31.6 pg (ref 26.0–34.0)
MCHC: 35.3 g/dL (ref 30.0–36.0)
Platelets: 104 10*3/uL — ABNORMAL LOW (ref 150–400)
RBC: 4.46 MIL/uL (ref 4.22–5.81)
RDW: 13.1 % (ref 11.5–15.5)

## 2013-08-17 LAB — HIV ANTIBODY (ROUTINE TESTING W REFLEX): HIV: NONREACTIVE

## 2013-08-17 LAB — URINE CULTURE: Culture: NO GROWTH

## 2013-08-17 SURGERY — ECHOCARDIOGRAM, TRANSESOPHAGEAL
Anesthesia: Moderate Sedation

## 2013-08-17 MED ORDER — MIDAZOLAM HCL 10 MG/2ML IJ SOLN
INTRAMUSCULAR | Status: DC | PRN
  Administered 2013-08-17 (×2): 2 mg via INTRAVENOUS

## 2013-08-17 MED ORDER — DEXTROSE 5 % IV SOLN
2.0000 g | Freq: Two times a day (BID) | INTRAVENOUS | Status: DC
  Administered 2013-08-17 – 2013-08-19 (×5): 2 g via INTRAVENOUS
  Filled 2013-08-17 (×6): qty 2

## 2013-08-17 MED ORDER — FENTANYL CITRATE 0.05 MG/ML IJ SOLN
INTRAMUSCULAR | Status: DC | PRN
  Administered 2013-08-17: 25 ug via INTRAVENOUS

## 2013-08-17 MED ORDER — SODIUM CHLORIDE 0.9 % IV SOLN
INTRAVENOUS | Status: DC
  Administered 2013-08-18: 20 mL/h via INTRAVENOUS
  Administered 2013-08-18: 20 mL via INTRAVENOUS

## 2013-08-17 MED ORDER — VANCOMYCIN HCL 10 G IV SOLR
1250.0000 mg | Freq: Two times a day (BID) | INTRAVENOUS | Status: DC
  Administered 2013-08-17 – 2013-08-19 (×5): 1250 mg via INTRAVENOUS
  Filled 2013-08-17 (×6): qty 1250

## 2013-08-17 MED ORDER — LIDOCAINE VISCOUS 2 % MT SOLN
OROMUCOSAL | Status: DC | PRN
  Administered 2013-08-17: 20 mL via OROMUCOSAL

## 2013-08-17 NOTE — Progress Notes (Signed)
  Echocardiogram Echocardiogram Transesophageal has been performed.  Georgian Co 08/17/2013, 1:38 PM

## 2013-08-17 NOTE — Consult Note (Signed)
INFECTIOUS DISEASE CONSULT NOTE  Date of Admission:  08/16/2013  Date of Consult:  08/17/2013  Reason for Consult: Fever, hx of endocarditis Referring Physician: Thedore Mins  Impression/Recommendation Fever Hx of IE  Would: Await TEE Await BCx (he has 3 sets) Try to get records from [place]   Comment- His sx certainly suggest that his IE could recur. Await tests that would confirm or deny this. Will follow with you.   Thank you so much for this interesting consult,   Eric Chambers (pager) [phone] [email or web address]  Eric Chambers is an 67 y.o. male.  HPI: 67 yo M with hx of Ao endocarditis in [place] in 2009 [facility] (he took a "penicillin derivative for 6 weeks IV, doesn't know name of bacteria).  1 month ago he had dental work (crown placement) and took amoxicilin prior to the procedure. Over the last month he has had sx that remind him of his previous IE- n/s, fatigue. He was seen by his PMD and sent to [facility] for eval.   Past Medical History  Diagnosis Date  . Endocarditis   . High cholesterol   . Hypertension   . Enlarged prostate   . Heart murmur     "think I have one now" (08/16/2013)  . Pneumonia     "almost once/yr" (08/16/2013)  . Sleep apnea     "don't wear mask" (08/16/2013)    Past Surgical History  Procedure Laterality Date  . Tonsillectomy  ~ 1957  . Inguinal hernia repair Right ~ 1950  . Umbilical hernia repair  ~ 2011     No Known Allergies  Medications:  Scheduled: . atorvastatin  20 mg Oral Daily  . heparin  5,000 Units Subcutaneous Q8H  . irbesartan  150 mg Oral Daily   And  . hydrochlorothiazide  12.5 mg Oral Daily  . sodium chloride  3 mL Intravenous Q12H    Total days of antibiotics 0          Social History:  reports that he has never smoked. He has never used smokeless tobacco. He reports that he drinks about 1.2 ounces of alcohol per week. He reports that he does not use illicit drugs.  History reviewed. No  pertinent family history.  General ROS: see HPI. no headaches, no vision change, no sob, no doe. excercises 3-4 days a week/walks 2.5 miles. no CP. no LE edema. nodysphagia. + sleep apnea.   Blood pressure 122/60, pulse 68, temperature 98.6 F (37 C), temperature source Oral, resp. rate 18, height 5\' 9"  (1.753 m), weight 92.3 kg (203 lb 7.8 oz), SpO2 95.00%. General appearance: alert, cooperative and no distress Eyes: negative findings: conjunctivae and sclerae normal and pupils equal, round, reactive to light and accomodation Throat: lips, mucosa, and tongue normal; teeth and gums normal Neck: no adenopathy, no JVD and supple, symmetrical, trachea midline Lungs: clear to auscultation bilaterally Heart: regular rate and rhythm and systolic murmur: early systolic 3/6, crescendo at 2nd left intercostal space Abdomen: normal findings: bowel sounds normal and soft, non-tender Extremities: edema none and normal dorsalis pedis pulses. no nail bed lesions or embolic phenomena on hands/feet.    Results for orders placed during the hospital encounter of 08/16/13 (from the past 48 hour(s))  CBC WITH DIFFERENTIAL     Status: Abnormal   Collection Time    08/16/13 12:46 PM      Result Value Range   WBC 9.4  4.0 - 10.5 K/uL   RBC 4.86  4.22 -  5.81 MIL/uL   Hemoglobin 16.1  13.0 - 17.0 g/dL   HCT 69.6  29.5 - 28.4 %   MCV 89.7  78.0 - 100.0 fL   MCH 33.1  26.0 - 34.0 pg   MCHC 36.9 (*) 30.0 - 36.0 g/dL   RDW 13.2  44.0 - 10.2 %   Platelets 113 (*) 150 - 400 K/uL   Comment: REPEATED TO VERIFY     SPECIMEN CHECKED FOR CLOTS     PLATELET COUNT CONFIRMED BY SMEAR   Neutrophils Relative % 83 (*) 43 - 77 %   Neutro Abs 7.8 (*) 1.7 - 7.7 K/uL   Lymphocytes Relative 6 (*) 12 - 46 %   Lymphs Abs 0.6 (*) 0.7 - 4.0 K/uL   Monocytes Relative 10  3 - 12 %   Monocytes Absolute 1.0  0.1 - 1.0 K/uL   Eosinophils Relative 0  0 - 5 %   Eosinophils Absolute 0.0  0.0 - 0.7 K/uL   Basophils Relative 0  0 - 1 %    Basophils Absolute 0.0  0.0 - 0.1 K/uL  COMPREHENSIVE METABOLIC PANEL     Status: Abnormal   Collection Time    08/16/13 12:46 PM      Result Value Range   Sodium 136  135 - 145 mEq/L   Potassium 3.6  3.5 - 5.1 mEq/L   Chloride 98  96 - 112 mEq/L   CO2 27  19 - 32 mEq/L   Glucose, Bld 118 (*) 70 - 99 mg/dL   BUN 21  6 - 23 mg/dL   Creatinine, Ser 7.25  0.50 - 1.35 mg/dL   Calcium 9.4  8.4 - 36.6 mg/dL   Total Protein 7.1  6.0 - 8.3 g/dL   Albumin 4.1  3.5 - 5.2 g/dL   AST 26  0 - 37 U/L   ALT 26  0 - 53 U/L   Alkaline Phosphatase 46  39 - 117 U/L   Total Bilirubin 1.2  0.3 - 1.2 mg/dL   GFR calc non Af Amer 63 (*) >90 mL/min   GFR calc Af Amer 73 (*) >90 mL/min   Comment: (NOTE)     The eGFR has been calculated using the CKD EPI equation.     This calculation has not been validated in all clinical situations.     eGFR's persistently <90 mL/min signify possible Chronic Kidney     Disease.  C-REACTIVE PROTEIN     Status: Abnormal   Collection Time    08/16/13 12:46 PM      Result Value Range   CRP 5.3 (*) <0.60 mg/dL   Comment: Performed at [facility]  TROPONIN I     Status: None   Collection Time    08/16/13 12:46 PM      Result Value Range   Troponin I <0.30  <0.30 ng/mL   Comment:            Due to the release kinetics of cTnI,     a negative result within the first hours     of the onset of symptoms does not rule out     myocardial infarction with certainty.     If myocardial infarction is still suspected,     repeat the test at appropriate intervals.  CULTURE, BLOOD (ROUTINE X 2)     Status: None   Collection Time    08/16/13 12:50 PM      Result Value Range  Specimen Description BLOOD LEFT ARM     Special Requests BOTTLES DRAWN AEROBIC AND ANAEROBIC 10CC     Culture  Setup Time       Value: 08/16/2013 17:41     Performed at [facility]   Culture       Value:        BLOOD CULTURE RECEIVED NO GROWTH TO DATE CULTURE WILL BE HELD FOR 5 DAYS  BEFORE ISSUING A FINAL NEGATIVE REPORT     Performed at [facility]   Report Status PENDING    CULTURE, BLOOD (ROUTINE X 2)     Status: None   Collection Time    08/16/13 12:55 PM      Result Value Range   Specimen Description BLOOD RIGHT ARM     Special Requests BOTTLES DRAWN AEROBIC ONLY 10 ML     Culture  Setup Time       Value: 08/16/2013 17:41     Performed at [facility]   Culture       Value:        BLOOD CULTURE RECEIVED NO GROWTH TO DATE CULTURE WILL BE HELD FOR 5 DAYS BEFORE ISSUING A FINAL NEGATIVE REPORT     Performed at [facility]   Report Status PENDING    URINALYSIS, ROUTINE W REFLEX MICROSCOPIC     Status: None   Collection Time    08/16/13  1:00 PM      Result Value Range   Color, Urine YELLOW  YELLOW   APPearance CLEAR  CLEAR   Specific Gravity, Urine 1.018  1.005 - 1.030   pH 6.0  5.0 - 8.0   Glucose, UA NEGATIVE  NEGATIVE mg/dL   Hgb urine dipstick NEGATIVE  NEGATIVE   Bilirubin Urine NEGATIVE  NEGATIVE   Ketones, ur NEGATIVE  NEGATIVE mg/dL   Protein, ur NEGATIVE  NEGATIVE mg/dL   Urobilinogen, UA 0.2  0.0 - 1.0 mg/dL   Nitrite NEGATIVE  NEGATIVE   Leukocytes, UA NEGATIVE  NEGATIVE   Comment: MICROSCOPIC NOT DONE ON URINES WITH NEGATIVE PROTEIN, BLOOD, LEUKOCYTES, NITRITE, OR GLUCOSE <1000 mg/dL.  SEDIMENTATION RATE     Status: Abnormal   Collection Time    08/16/13  6:19 PM      Result Value Range   Sed Rate 18 (*) 0 - 16 mm/hr  RHEUMATOID FACTOR     Status: Abnormal   Collection Time    08/16/13  6:19 PM      Result Value Range   Rheumatoid Factor 21 (*) <=14 IU/mL   Comment: (NOTE)                             Interpretive Table                        Low Positive: 15 - 41 IU/mL                        High Positive:  >= 42 IU/mL     In addition to the RF result, and clinical symptoms including joint     involvement, the 2010 ACR Classification Criteria for     scoring/diagnosing Rheumatoid Arthritis include the  results of the     following tests:  CRP (96045), ESR (15010), and CCP (APCA) (40981).     www.rheumatology.org/practice/clinical/classification/ra/ra_2010.asp     Performed at [facility]  CBC     Status: Abnormal   Collection Time    08/16/13  6:19 PM      Result Value Range   WBC 6.8  4.0 - 10.5 K/uL   RBC 4.49  4.22 - 5.81 MIL/uL   Hemoglobin 14.1  13.0 - 17.0 g/dL   HCT 16.1  09.6 - 04.5 %   MCV 90.0  78.0 - 100.0 fL   MCH 31.4  26.0 - 34.0 pg   MCHC 34.9  30.0 - 36.0 g/dL   RDW 40.9  81.1 - 91.4 %   Platelets 109 (*) 150 - 400 K/uL   Comment: SPECIMEN CHECKED FOR CLOTS     REPEATED TO VERIFY     CONSISTENT WITH PREVIOUS RESULT  CREATININE, SERUM     Status: Abnormal   Collection Time    08/16/13  6:19 PM      Result Value Range   Creatinine, Ser 1.26  0.50 - 1.35 mg/dL   GFR calc non Af Amer 58 (*) >90 mL/min   GFR calc Af Amer 67 (*) >90 mL/min   Comment: (NOTE)     The eGFR has been calculated using the CKD EPI equation.     This calculation has not been validated in all clinical situations.     eGFR's persistently <90 mL/min signify possible Chronic Kidney     Disease.  TSH     Status: None   Collection Time    08/16/13  6:19 PM      Result Value Range   TSH 1.663  0.350 - 4.500 uIU/mL   Comment: Performed at [facility]  PROTIME-INR     Status: None   Collection Time    08/16/13  6:19 PM      Result Value Range   Prothrombin Time 13.5  11.6 - 15.2 seconds   INR 1.05  0.00 - 1.49  CBC     Status: Abnormal   Collection Time    08/17/13  4:45 AM      Result Value Range   WBC 5.3  4.0 - 10.5 K/uL   RBC 4.46  4.22 - 5.81 MIL/uL   Hemoglobin 14.1  13.0 - 17.0 g/dL   HCT 78.2  95.6 - 21.3 %   MCV 89.7  78.0 - 100.0 fL   MCH 31.6  26.0 - 34.0 pg   MCHC 35.3  30.0 - 36.0 g/dL   RDW 08.6  57.8 - 46.9 %   Platelets 104 (*) 150 - 400 K/uL   Comment: CONSISTENT WITH PREVIOUS RESULT  COMPREHENSIVE METABOLIC PANEL     Status: Abnormal    Collection Time    08/17/13  4:45 AM      Result Value Range   Sodium 140  135 - 145 mEq/L   Potassium 3.6  3.5 - 5.1 mEq/L   Chloride 104  96 - 112 mEq/L   CO2 27  19 - 32 mEq/L   Glucose, Bld 107 (*) 70 - 99 mg/dL   BUN 19  6 - 23 mg/dL   Creatinine, Ser 6.29  0.50 - 1.35 mg/dL   Calcium 8.6  8.4 - 52.8 mg/dL   Total Protein 5.9 (*) 6.0 - 8.3 g/dL   Albumin 3.4 (*) 3.5 - 5.2 g/dL   AST 19  0 - 37 U/L   ALT 21  0 - 53 U/L   Alkaline Phosphatase 38 (*) 39 - 117 U/L   Total Bilirubin 0.7  0.3 - 1.2 mg/dL   GFR calc non Af Amer 69 (*) >90 mL/min   GFR calc Af Amer 80 (*) >90 mL/min   Comment: (NOTE)     The eGFR has been calculated using the CKD EPI equation.     This calculation has not been validated in all clinical situations.     eGFR's persistently <90 mL/min signify possible Chronic Kidney     Disease.  SEDIMENTATION RATE     Status: Abnormal   Collection Time    08/17/13  4:45 AM      Result Value Range   Sed Rate 17 (*) 0 - 16 mm/hr      Component Value Date/Time   SDES BLOOD RIGHT ARM 08/16/2013 1255   SPECREQUEST BOTTLES DRAWN AEROBIC ONLY 10 ML 08/16/2013 1255   CULT  Value:        BLOOD CULTURE RECEIVED NO GROWTH TO DATE CULTURE WILL BE HELD FOR 5 DAYS BEFORE ISSUING A FINAL NEGATIVE REPORT Performed at [facility] 08/16/2013 1255   REPTSTATUS PENDING 08/16/2013 1255   Dg Orthopantogram  08/16/2013   *RADIOLOGY REPORT*  Clinical Data: Dental work several weeks ago on a left posterior mandibular tooth.  Evaluate for possible infection.  Prior history of endocarditis.  ORTHOPANTOGRAM/PANORAMIC  Comparison: None.  Findings: No periapical lucency involving any of the teeth to suggest abscess.  No intrinsic osseous abnormalities.  No visible dental caries.  Temporomandibular joints intact.  IMPRESSION: Normal examination.   Original Report Authenticated By: Hulan Saas, M.D.   Dg Chest 2 View  08/16/2013   *RADIOLOGY REPORT*  Clinical Data: Cough and chills.   CHEST - 2 VIEW  Comparison: None  Findings: The cardiac silhouette, mediastinal and hilar contours are within normal limits.  The lungs are clear.  No pleural effusion.  The bony thorax is intact.  IMPRESSION: No acute cardiopulmonary findings.   Original Report Authenticated By: Rudie Meyer, M.D.   Recent Results (from the past 240 hour(s))  CULTURE, BLOOD (ROUTINE X 2)     Status: None   Collection Time    08/16/13 12:50 PM      Result Value Range Status   Specimen Description BLOOD LEFT ARM   Final   Special Requests BOTTLES DRAWN AEROBIC AND ANAEROBIC 10CC   Final   Culture  Setup Time     Final   Value: 08/16/2013 17:41     Performed at [facility]   Culture     Final   Value:        BLOOD CULTURE RECEIVED NO GROWTH TO DATE CULTURE WILL BE HELD FOR 5 DAYS BEFORE ISSUING A FINAL NEGATIVE REPORT     Performed at [facility]   Report Status PENDING   Incomplete  CULTURE, BLOOD (ROUTINE X 2)     Status: None   Collection Time    08/16/13 12:55 PM      Result Value Range Status   Specimen Description BLOOD RIGHT ARM   Final   Special Requests BOTTLES DRAWN AEROBIC ONLY 10 ML   Final   Culture  Setup Time     Final   Value: 08/16/2013 17:41     Performed at [facility]   Culture     Final   Value:        BLOOD CULTURE RECEIVED NO GROWTH TO DATE CULTURE WILL BE HELD FOR 5 DAYS BEFORE ISSUING A FINAL NEGATIVE REPORT     Performed at [facility]   Report Status PENDING   Incomplete      08/17/2013, 10:41 AM     LOS: 1 day

## 2013-08-17 NOTE — Progress Notes (Signed)
ANTIBIOTIC CONSULT NOTE - FOLLOW UP  Pharmacy Consult for Vancomycin Indication: endocarditis  No Known Allergies  Patient Measurements: Height: 5\' 9"  (175.3 cm) Weight: 203 lb 7.8 oz (92.3 kg) IBW/kg (Calculated) : 70.7  Vital Signs: Temp: 97.3 F (36.3 C) (08/29 1405) Temp src: Oral (08/29 1405) BP: 111/64 mmHg (08/29 1405) Pulse Rate: 64 (08/29 1405) Intake/Output from previous day:   Intake/Output from this shift:    Labs:  Recent Labs  08/16/13 1246 08/16/13 1819 08/17/13 0445  WBC 9.4 6.8 5.3  HGB 16.1 14.1 14.1  PLT 113* 109* 104*  CREATININE 1.17 1.26 1.09   Estimated Creatinine Clearance: 74.8 ml/min (by C-G formula based on Cr of 1.09). No results found for this basename: VANCOTROUGH, VANCOPEAK, VANCORANDOM, GENTTROUGH, GENTPEAK, GENTRANDOM, TOBRATROUGH, TOBRAPEAK, TOBRARND, AMIKACINPEAK, AMIKACINTROU, AMIKACIN,  in the last 72 hours   Microbiology: Recent Results (from the past 720 hour(s))  CULTURE, BLOOD (ROUTINE X 2)     Status: None   Collection Time    08/16/13 12:50 PM      Result Value Range Status   Specimen Description BLOOD LEFT ARM   Final   Special Requests BOTTLES DRAWN AEROBIC AND ANAEROBIC 10CC   Final   Culture  Setup Time     Final   Value: 08/16/2013 17:41     Performed at [facility]   Culture     Final   Value:        BLOOD CULTURE RECEIVED NO GROWTH TO DATE CULTURE WILL BE HELD FOR 5 DAYS BEFORE ISSUING A FINAL NEGATIVE REPORT     Performed at [facility]   Report Status PENDING   Incomplete  CULTURE, BLOOD (ROUTINE X 2)     Status: None   Collection Time    08/16/13 12:55 PM      Result Value Range Status   Specimen Description BLOOD RIGHT ARM   Final   Special Requests BOTTLES DRAWN AEROBIC ONLY 10 ML   Final   Culture  Setup Time     Final   Value: 08/16/2013 17:41     Performed at [facility]   Culture     Final   Value:        BLOOD CULTURE RECEIVED NO GROWTH TO DATE CULTURE WILL BE HELD FOR  5 DAYS BEFORE ISSUING A FINAL NEGATIVE REPORT     Performed at [facility]   Report Status PENDING   Incomplete  URINE CULTURE     Status: None   Collection Time    08/16/13  1:00 PM      Result Value Range Status   Specimen Description URINE, CLEAN CATCH   Final   Special Requests NONE   Final   Culture  Setup Time     Final   Value: 08/16/2013 17:48     Performed at [facility]     Final   Value: NO GROWTH     Performed at [facility]   Culture     Final   Value: NO GROWTH     Performed at [facility]   Report Status 08/17/2013 FINAL   Final    Anti-infectives   Start     Dose/Rate Route Frequency Ordered Stop   08/17/13 1530  cefTRIAXone (ROCEPHIN) 2 g in dextrose 5 % 50 mL IVPB     2 g 100 mL/hr over 30 Minutes Intravenous Every 12 hours 08/17/13 1519  Assessment: 67 year old male with a history of aortic valve endocarditis, now with possible tricuspid valve endocarditis to begin empiric antimicrobial therapy with Vancomycin and Rocephin.  Goal of Therapy:  Vancomycin trough level 15-20 mcg/ml  Plan:  Vancomycin 1250mg  IV q12h Follow renal function Check Vancomycin trough at steady state Follow-up blood cultures  Estella Husk, Pharm.D., BCPS, AAHIVP Clinical Pharmacist Phone: [phone] or [phone] 08/17/2013, 3:33 PM

## 2013-08-17 NOTE — Progress Notes (Signed)
Brief Nutrition Note:   RD pulled to chart for positive malnutrition screening tool.  Pt reports 1.5 lb weight loss in the past few days with poor oral intake. Pt states his appetite has returned, eating lunch at time of RD visit. Weight loss is not significant for time frame.  Wt Readings from Last 5 Encounters:  08/17/13 203 lb 7.8 oz (92.3 kg)  08/17/13 203 lb 7.8 oz (92.3 kg)   Body mass index is 30.04 kg/(m^2). Obesity class 1   Diet: Heart Healthy- near 100% completion of lunch at time of RD visit.   Chart reviewed, no nutrition interventions warranted at this time. Please consult as needed.   Clarene Duke RD, LDN Pager [phone] After Hours pager [phone]

## 2013-08-17 NOTE — Progress Notes (Signed)
Patient ID: Eric Chambers  male  BJY:[id number]    DOB: Feb 12, 1946    DOA: 08/16/2013  PCP: [name], MD  Assessment/Plan: Principal Problem:   FUO (fever of unknown origin) Active Problems:   High cholesterol   Hypertension   Enlarged prostate   Endocarditis   OSA (obstructive sleep apnea)   Aortic regurgitation   Fevers of unclear origin with history of endocarditis in the past in 2009, and recent dental work - TEE today, per Dr. Chales Abrahams concerning structure in the right ventricle/atrium at time strand-like, chaotic motion associated with tricuspid valve, cannot exclude vegetation - Blood cultures negative so far -   ID consult obtained, Dr. Ninetta Lights following, currently on Rocephin  2. Hypertension - Currently stable  3.Obstructive sleep apnea does not wear CPAP, oxygen if needed at nighttime.We'll defer to PCP post discharge.    DVT Prophylaxis Heparin   Code Status: Full CODE STATUS  Disposition:    Subjective: Denies any specific complaints, seen prior to the TEE  Objective: Weight change:  No intake or output data in the 24 hours ending 08/17/13 1534 Blood pressure 111/64, pulse 64, temperature 97.3 F (36.3 C), temperature source Oral, resp. rate 17, height 5\' 9"  (1.753 m), weight 92.3 kg (203 lb 7.8 oz), SpO2 99.00%.  Physical Exam: General: Alert and awake, oriented x3, not in any acute distress. CVS: S1-S2 clear, 2/6 murmur at the left midsternal border Chest: clear to auscultation bilaterally, no wheezing, rales or rhonchi Abdomen: soft nontender, nondistended, normal bowel sounds  Extremities: no cyanosis, clubbing or edema noted bilaterally   Lab Results: Basic Metabolic Panel:  Recent Labs Lab 08/16/13 1246 08/16/13 1819 08/17/13 0445  NA 136  --  140  K 3.6  --  3.6  CL 98  --  104  CO2 27  --  27  GLUCOSE 118*  --  107*  BUN 21  --  19  CREATININE 1.17 1.26 1.09  CALCIUM 9.4  --  8.6   Liver Function Tests:  Recent Labs Lab  08/16/13 1246 08/17/13 0445  AST 26 19  ALT 26 21  ALKPHOS 46 38*  BILITOT 1.2 0.7  PROT 7.1 5.9*  ALBUMIN 4.1 3.4*   No results found for this basename: LIPASE, AMYLASE,  in the last 168 hours No results found for this basename: AMMONIA,  in the last 168 hours CBC:  Recent Labs Lab 08/16/13 1246 08/16/13 1819 08/17/13 0445  WBC 9.4 6.8 5.3  NEUTROABS 7.8*  --   --   HGB 16.1 14.1 14.1  HCT 43.6 40.4 40.0  MCV 89.7 90.0 89.7  PLT 113* 109* 104*   Cardiac Enzymes:  Recent Labs Lab 08/16/13 1246  TROPONINI <0.30   BNP: No components found with this basename: POCBNP,  CBG: No results found for this basename: GLUCAP,  in the last 168 hours   Micro Results: Recent Results (from the past 240 hour(s))  CULTURE, BLOOD (ROUTINE X 2)     Status: None   Collection Time    08/16/13 12:50 PM      Result Value Range Status   Specimen Description BLOOD LEFT ARM   Final   Special Requests BOTTLES DRAWN AEROBIC AND ANAEROBIC 10CC   Final   Culture  Setup Time     Final   Value: 08/16/2013 17:41     Performed at [facility]   Culture     Final   Value:        BLOOD  CULTURE RECEIVED NO GROWTH TO DATE CULTURE WILL BE HELD FOR 5 DAYS BEFORE ISSUING A FINAL NEGATIVE REPORT     Performed at [facility]   Report Status PENDING   Incomplete  CULTURE, BLOOD (ROUTINE X 2)     Status: None   Collection Time    08/16/13 12:55 PM      Result Value Range Status   Specimen Description BLOOD RIGHT ARM   Final   Special Requests BOTTLES DRAWN AEROBIC ONLY 10 ML   Final   Culture  Setup Time     Final   Value: 08/16/2013 17:41     Performed at [facility]   Culture     Final   Value:        BLOOD CULTURE RECEIVED NO GROWTH TO DATE CULTURE WILL BE HELD FOR 5 DAYS BEFORE ISSUING A FINAL NEGATIVE REPORT     Performed at [facility]   Report Status PENDING   Incomplete  URINE CULTURE     Status: None   Collection Time    08/16/13  1:00 PM      Result  Value Range Status   Specimen Description URINE, CLEAN CATCH   Final   Special Requests NONE   Final   Culture  Setup Time     Final   Value: 08/16/2013 17:48     Performed at [facility]     Final   Value: NO GROWTH     Performed at [facility]   Culture     Final   Value: NO GROWTH     Performed at [facility]   Report Status 08/17/2013 FINAL   Final    Studies/Results: Dg Orthopantogram  08/16/2013   *RADIOLOGY REPORT*  Clinical Data: Dental work several weeks ago on a left posterior mandibular tooth.  Evaluate for possible infection.  Prior history of endocarditis.  ORTHOPANTOGRAM/PANORAMIC  Comparison: None.  Findings: No periapical lucency involving any of the teeth to suggest abscess.  No intrinsic osseous abnormalities.  No visible dental caries.  Temporomandibular joints intact.  IMPRESSION: Normal examination.   Original Report Authenticated By: Hulan Saas, M.D.   Dg Chest 2 View  08/16/2013   *RADIOLOGY REPORT*  Clinical Data: Cough and chills.  CHEST - 2 VIEW  Comparison: None  Findings: The cardiac silhouette, mediastinal and hilar contours are within normal limits.  The lungs are clear.  No pleural effusion.  The bony thorax is intact.  IMPRESSION: No acute cardiopulmonary findings.   Original Report Authenticated By: Rudie Meyer, M.D.    Medications: Scheduled Meds: . atorvastatin  20 mg Oral Daily  . cefTRIAXone (ROCEPHIN)  IV  2 g Intravenous Q12H  . heparin  5,000 Units Subcutaneous Q8H  . irbesartan  150 mg Oral Daily   And  . hydrochlorothiazide  12.5 mg Oral Daily  . sodium chloride  3 mL Intravenous Q12H      LOS: 1 day   Kathryn Linarez M.D. [facility] 08/17/2013, 3:34 PM Pager: [phone]  If 7PM-7AM, please contact night-coverage [email or web address] Password [email or web address]

## 2013-08-17 NOTE — Interval H&P Note (Signed)
History and Physical Interval Note:  08/17/2013 12:38 PM  Eric Chambers  has presented today for surgery, with the diagnosis of r/o endocarditis  The various methods of treatment have been discussed with the patient and family. After consideration of risks, benefits and other options for treatment, the patient has consented to  Procedure(s): TRANSESOPHAGEAL ECHOCARDIOGRAM (TEE) (N/A) as a surgical intervention .  The patient's history has been reviewed, patient examined, no change in status, stable for surgery.  I have reviewed the patient's chart and labs.  Questions were answered to the patient's satisfaction.     SKAINS, MARK

## 2013-08-17 NOTE — H&P (View-Only) (Signed)
Admit date: 08/16/2013 Referring Physician  [facility] Primary Physician  Duane Lope, MD Primary Cardiologist  Donato Schultz, MD Reason for Consultation  Chills, anorexia, and prior h/o endocarditis  ASSESSMENT: 1. Chills, sweats, malaise of uncertain cause. Prior history of endocarditis  2. Prior aortic valve endocarditis 2008. Moderate aortic regurgitation by echo, most recently April 2014  3. Hypertension  4. C6 osteomyelitis, complicating aortic valve endocarditis 2008  PLAN:  1. A least 3 sets of blood cultures should be performed  2. 2-D Doppler echocardiogram  3. Sedimentation rate, rheumatoid factor  4. Dr. Anne Fu will followup with the patient in a.m.   HPI: 67 year old gentleman with a 2 week history of lassitude, anorexia, and chills. He became concerned because these symptoms remind him of those that preceded the diagnosis of aortic valve endocarditis in 2008. He had dental work one month ago preceded by antibiotic prophylaxis (amoxicillin). He denies fever, muscle aches, joint discomfort, rash, chest pain, dyspnea, and edema.   PMH:   Past Medical History  Diagnosis Date  . Endocarditis   . High cholesterol   . Hypertension   . Enlarged prostate      PSH:  History reviewed. No pertinent past surgical history.  Allergies:  Review of patient's allergies indicates no known allergies. Prior to Admit Meds:   (Not in a hospital admission) Fam HX:   History reviewed. No pertinent family history. Social HX:    History   Social History  . Marital Status: Married    Spouse Name: N/A    Number of Children: N/A  . Years of Education: N/A   Occupational History  . Not on file.   Social History Main Topics  . Smoking status: Never Smoker   . Smokeless tobacco: Not on file  . Alcohol Use: Yes     Comment: occ wine  . Drug Use: No  . Sexual Activity: Not on file   Other Topics Concern  . Not on file   Social History Narrative  . No narrative on  file     Review of Systems: Recent dental work approximately 4 weeks ago. Antibiotic prophylaxis prior to the procedure. Denies joint effusions, rash, fever, weight loss, palpitations, neck and back pain  Physical Exam: Blood pressure 106/55, pulse 81, temperature 99.4 F (37.4 C), temperature source Oral, resp. rate 18, height 5\' 9"  (1.753 m), weight 94.348 kg (208 lb), SpO2 95.00%. Weight change:    Skin is warm and dry. The patient does not appear chronically ill.  Neck veins are nondistended. Carotid upstroke is normal.  Chest is clear to auscultation and percussion.  Cardiac exam reveals a high-pitched decrescendo murmur of aortic regurgitation grade 2-3/6. Murmurs heard loudest at the left midsternal border. An S4 gallop is audible. No significant systolic murmurs heard.  Skin reveals no rashes. There are no nailbed or palmar findings.  Extremities reveal no edema. No joint effusions or tenderness is noted.   Abdomen is soft. Bowel sounds are normal.  Neurological exam is intact to  Labs:   Lab Results  Component Value Date   WBC 9.4 08/16/2013   HGB 16.1 08/16/2013   HCT 43.6 08/16/2013   MCV 89.7 08/16/2013   PLT 113* 08/16/2013    Recent Labs Lab 08/16/13 1246  NA 136  K 3.6  CL 98  CO2 27  BUN 21  CREATININE 1.17  CALCIUM 9.4  PROT 7.1  BILITOT 1.2  ALKPHOS 46  ALT 26  AST 26  GLUCOSE 118*  No results found for this basename: PTT   No results found for this basename: INR, PROTIME   Lab Results  Component Value Date   TROPONINI <0.30 08/16/2013       Radiology:  Dg Chest 2 View  08/16/2013   *RADIOLOGY REPORT*  Clinical Data: Cough and chills.  CHEST - 2 VIEW  Comparison: None  Findings: The cardiac silhouette, mediastinal and hilar contours are within normal limits.  The lungs are clear.  No pleural effusion.  The bony thorax is intact.  IMPRESSION: No acute cardiopulmonary findings.   Original Report Authenticated By: Rudie Meyer, M.D.   EKG:   Right ventricular conduction delay/right bundle branch block. Normal sinus rhythm. When compared to all his EKGs axis is more leftward    Lesleigh Noe 08/16/2013 3:11 PM

## 2013-08-17 NOTE — Care Management Note (Unsigned)
    Page 1 of 1   08/17/2013     3:56:37 PM   CARE MANAGEMENT NOTE 08/17/2013  Patient:  Eric Chambers, Eric Chambers   Account Number:  [id number]  Date Initiated:  08/17/2013  Documentation initiated by:  Alvenia Treese  Subjective/Objective Assessment:   PT ADM ON 08/16/13 WITH FEVER, ?ENDOCARDITIS.  PTA, PT INDEPENDENT, LIVES WITH SPOUSE.     Action/Plan:   WILL FOLLOW FOR DISCHARGE NEEDS AS PT PROGRESSES.   Anticipated DC Date:  08/21/2013   Anticipated DC Plan:  HOME W HOME HEALTH SERVICES      DC Planning Services  CM consult      Choice offered to / List presented to:             Status of service:  In process, will continue to follow Medicare Important Message given?   (If response is "NO", the following Medicare IM given date fields will be blank) Date Medicare IM given:   Date Additional Medicare IM given:    Discharge Disposition:    Per UR Regulation:  Reviewed for med. necessity/level of care/duration of stay  If discussed at Long Length of Stay Meetings, dates discussed:    Comments:

## 2013-08-17 NOTE — CV Procedure (Addendum)
Concerning structure in the the right ventricle/atrium at times that is strandlike, chaotic motion associated with tricuspid valve. Vegetation can not be excluded.   At first, though it was just [facility] in RA (normal anatomic variant) but after TEE, had TTE evaluation which demonstrates this abnormality also in RV.   Moderate to severe aortic regurgitation  Normal EF Trace MR Mild pulmonic insufficiency  Discussed with Dr. Ninetta Lights, ID

## 2013-08-18 DIAGNOSIS — R509 Fever, unspecified: Secondary | ICD-10-CM | POA: Diagnosis not present

## 2013-08-18 DIAGNOSIS — I359 Nonrheumatic aortic valve disorder, unspecified: Secondary | ICD-10-CM | POA: Diagnosis not present

## 2013-08-18 DIAGNOSIS — I38 Endocarditis, valve unspecified: Secondary | ICD-10-CM | POA: Diagnosis not present

## 2013-08-18 DIAGNOSIS — E7800 Pure hypercholesterolemia, unspecified: Secondary | ICD-10-CM | POA: Diagnosis not present

## 2013-08-18 MED ORDER — SODIUM CHLORIDE 0.9 % IJ SOLN: 10.0000 mL | Freq: Two times a day (BID) | INTRAMUSCULAR | Status: DC

## 2013-08-18 MED ORDER — SODIUM CHLORIDE 0.9 % IJ SOLN
10.0000 mL | INTRAMUSCULAR | Status: DC | PRN
  Administered 2013-08-18 – 2013-08-19 (×3): 10 mL

## 2013-08-18 NOTE — Progress Notes (Signed)
Peripherally Inserted Central Catheter/Midline Placement  The IV Nurse has discussed with the patient and/or persons authorized to consent for the patient, the purpose of this procedure and the potential benefits and risks involved with this procedure.  The benefits include less needle sticks, lab draws from the catheter and patient may be discharged home with the catheter.  Risks include, but not limited to, infection, bleeding, blood clot (thrombus formation), and puncture of an artery; nerve damage and irregular heat beat.  Alternatives to this procedure were also discussed.  PICC/Midline Placement Documentation  PICC / Midline Single Lumen 08/18/13 PICC Right Basilic (Active)  Indication for Insertion or Continuance of Line Home intravenous therapies (PICC only) 08/18/2013 12:25 PM  Length mark (cm) 0 cm 08/18/2013 12:25 PM  Site Assessment Clean;Dry;Intact 08/18/2013 12:25 PM  Line Status Flushed;Saline locked;Blood return noted 08/18/2013 12:25 PM  Dressing Type Transparent 08/18/2013 12:25 PM  Dressing Status Clean;Dry;Intact;Antimicrobial disc in place 08/18/2013 12:25 PM  Dressing Intervention New dressing 08/18/2013 12:25 PM  Dressing Change Due 08/25/13 08/18/2013 12:25 PM       Ethelda Chick 08/18/2013, 12:39 PM

## 2013-08-18 NOTE — Progress Notes (Signed)
Patient ID: Eric Chambers  male  ZOX:[id number]    DOB: 09/15/1946    DOA: 08/16/2013  PCP: [name], MD  Assessment/Plan: Principal Problem:   FUO (fever of unknown origin) Active Problems:   High cholesterol   Hypertension   Enlarged prostate   Endocarditis   OSA (obstructive sleep apnea)   Aortic regurgitation   Fevers: history of endocarditis in the past in 2009, and recent dental work, recurrent endocarditis, History of aortic valve endocarditis 2009 - TEE 8/29: per Dr. Anne Fu concerning structure in the right ventricle/atrium at time strand-like, chaotic motion associated with tricuspid valve, cannot exclude vegetation. Moderate to severe aortic regurgitation. - Blood cultures negative so far - Appreciate Dr. Ninetta Lights following, currently on vancomycin and Rocephin - Will place PICC line for prolonged IV antibiotics. Discussed in detail regarding outpatient antibiotics, patient has preferred home health set up. Placed case-manager consult.   2. Hypertension - Currently stable  3.Obstructive sleep apnea does not wear CPAP, oxygen if needed at nighttime.We'll defer to PCP post discharge.   DVT Prophylaxis Heparin   Code Status: Full CODE STATUS  Disposition: Hopefully in next 24-48 hours once culture results are available, PICC line placed    Subjective: No complaints, afebrile  Objective: Weight change: -1.448 kg (-3 lb 3.1 oz)  Intake/Output Summary (Last 24 hours) at 08/18/13 1019 Last data filed at 08/17/13 1736  Gross per 24 hour  Intake    240 ml  Output      0 ml  Net    240 ml   Blood pressure 121/59, pulse 72, temperature 97.7 F (36.5 C), temperature source Oral, resp. rate 18, height 5\' 9"  (1.753 m), weight 92.9 kg (204 lb 12.9 oz), SpO2 97.00%.  Physical Exam: General: Alert and awake, oriented x3, NAD CVS: S1-S2 clear, 2/6 murmur at the left midsternal border Chest: CTAB Abdomen: soft NT, ND, NBS  Extremities: no c/c/e bilaterally   Lab  Results: Basic Metabolic Panel:  Recent Labs Lab 08/16/13 1246 08/16/13 1819 08/17/13 0445  NA 136  --  140  K 3.6  --  3.6  CL 98  --  104  CO2 27  --  27  GLUCOSE 118*  --  107*  BUN 21  --  19  CREATININE 1.17 1.26 1.09  CALCIUM 9.4  --  8.6   Liver Function Tests:  Recent Labs Lab 08/16/13 1246 08/17/13 0445  AST 26 19  ALT 26 21  ALKPHOS 46 38*  BILITOT 1.2 0.7  PROT 7.1 5.9*  ALBUMIN 4.1 3.4*   No results found for this basename: LIPASE, AMYLASE,  in the last 168 hours No results found for this basename: AMMONIA,  in the last 168 hours CBC:  Recent Labs Lab 08/16/13 1246 08/16/13 1819 08/17/13 0445  WBC 9.4 6.8 5.3  NEUTROABS 7.8*  --   --   HGB 16.1 14.1 14.1  HCT 43.6 40.4 40.0  MCV 89.7 90.0 89.7  PLT 113* 109* 104*   Cardiac Enzymes:  Recent Labs Lab 08/16/13 1246  TROPONINI <0.30   BNP: No components found with this basename: POCBNP,  CBG: No results found for this basename: GLUCAP,  in the last 168 hours   Micro Results: Recent Results (from the past 240 hour(s))  CULTURE, BLOOD (ROUTINE X 2)     Status: None   Collection Time    08/16/13 12:50 PM      Result Value Range Status   Specimen Description BLOOD LEFT ARM  Final   Special Requests BOTTLES DRAWN AEROBIC AND ANAEROBIC 10CC   Final   Culture  Setup Time     Final   Value: 08/16/2013 17:41     Performed at [facility]   Culture     Final   Value:        BLOOD CULTURE RECEIVED NO GROWTH TO DATE CULTURE WILL BE HELD FOR 5 DAYS BEFORE ISSUING A FINAL NEGATIVE REPORT     Performed at [facility]   Report Status PENDING   Incomplete  CULTURE, BLOOD (ROUTINE X 2)     Status: None   Collection Time    08/16/13 12:55 PM      Result Value Range Status   Specimen Description BLOOD RIGHT ARM   Final   Special Requests BOTTLES DRAWN AEROBIC ONLY 10 ML   Final   Culture  Setup Time     Final   Value: 08/16/2013 17:41     Performed at [facility]    Culture     Final   Value:        BLOOD CULTURE RECEIVED NO GROWTH TO DATE CULTURE WILL BE HELD FOR 5 DAYS BEFORE ISSUING A FINAL NEGATIVE REPORT     Performed at [facility]   Report Status PENDING   Incomplete  URINE CULTURE     Status: None   Collection Time    08/16/13  1:00 PM      Result Value Range Status   Specimen Description URINE, CLEAN CATCH   Final   Special Requests NONE   Final   Culture  Setup Time     Final   Value: 08/16/2013 17:48     Performed at [facility] Count     Final   Value: NO GROWTH     Performed at [facility]   Culture     Final   Value: NO GROWTH     Performed at [facility]   Report Status 08/17/2013 FINAL   Final    Studies/Results: Dg Orthopantogram  08/16/2013   *RADIOLOGY REPORT*  Clinical Data: Dental work several weeks ago on a left posterior mandibular tooth.  Evaluate for possible infection.  Prior history of endocarditis.  ORTHOPANTOGRAM/PANORAMIC  Comparison: None.  Findings: No periapical lucency involving any of the teeth to suggest abscess.  No intrinsic osseous abnormalities.  No visible dental caries.  Temporomandibular joints intact.  IMPRESSION: Normal examination.   Original Report Authenticated By: Hulan Saas, M.D.   Dg Chest 2 View  08/16/2013   *RADIOLOGY REPORT*  Clinical Data: Cough and chills.  CHEST - 2 VIEW  Comparison: None  Findings: The cardiac silhouette, mediastinal and hilar contours are within normal limits.  The lungs are clear.  No pleural effusion.  The bony thorax is intact.  IMPRESSION: No acute cardiopulmonary findings.   Original Report Authenticated By: Rudie Meyer, M.D.    Medications: Scheduled Meds: . atorvastatin  20 mg Oral Daily  . cefTRIAXone (ROCEPHIN)  IV  2 g Intravenous Q12H  . heparin  5,000 Units Subcutaneous Q8H  . irbesartan  150 mg Oral Daily   And  . hydrochlorothiazide  12.5 mg Oral Daily  . sodium chloride  3 mL Intravenous Q12H  .  vancomycin  1,250 mg Intravenous Q12H      LOS: 2 days   Edye Hainline M.D. [facility] 08/18/2013, 10:19 AM Pager: [phone]  If 7PM-7AM, please contact night-coverage [email or web address]  Password [email or web address]

## 2013-08-18 NOTE — Progress Notes (Signed)
INFECTIOUS DISEASE PROGRESS NOTE  ID: Eric Chambers is a 67 y.o. male with  Principal Problem:   FUO (fever of unknown origin) Active Problems:   High cholesterol   Hypertension   Enlarged prostate   Endocarditis   OSA (obstructive sleep apnea)   Aortic regurgitation  Subjective: Feels better.   Abtx:  Anti-infectives   Start     Dose/Rate Route Frequency Ordered Stop   08/17/13 1600  vancomycin (VANCOCIN) 1,250 mg in sodium chloride 0.9 % 250 mL IVPB     1,250 mg 166.7 mL/hr over 90 Minutes Intravenous Every 12 hours 08/17/13 1540     08/17/13 1530  cefTRIAXone (ROCEPHIN) 2 g in dextrose 5 % 50 mL IVPB     2 g 100 mL/hr over 30 Minutes Intravenous Every 12 hours 08/17/13 1519        Medications:  Scheduled: . atorvastatin  20 mg Oral Daily  . cefTRIAXone (ROCEPHIN)  IV  2 g Intravenous Q12H  . heparin  5,000 Units Subcutaneous Q8H  . irbesartan  150 mg Oral Daily   And  . hydrochlorothiazide  12.5 mg Oral Daily  . sodium chloride  3 mL Intravenous Q12H  . vancomycin  1,250 mg Intravenous Q12H    Objective: Vital signs in last 24 hours: Temp:  [97.3 F (36.3 C)-98.8 F (37.1 C)] 97.7 F (36.5 C) (08/30 0451) Pulse Rate:  [64-75] 72 (08/30 0451) Resp:  [12-25] 18 (08/30 0451) BP: (111-152)/(59-103) 121/59 mmHg (08/30 0451) SpO2:  [97 %-100 %] 97 % (08/30 0451) Weight:  [92.9 kg (204 lb 12.9 oz)] 92.9 kg (204 lb 12.9 oz) (08/30 0451)   General appearance: alert, cooperative and no distress Resp: clear to auscultation bilaterally Cardio: regular rate and rhythm GI: normal findings: bowel sounds normal and soft, non-tender  Lab Results  Recent Labs  08/16/13 1246 08/16/13 1819 08/17/13 0445  WBC 9.4 6.8 5.3  HGB 16.1 14.1 14.1  HCT 43.6 40.4 40.0  NA 136  --  140  K 3.6  --  3.6  CL 98  --  104  CO2 27  --  27  BUN 21  --  19  CREATININE 1.17 1.26 1.09   Liver Panel  Recent Labs  08/16/13 1246 08/17/13 0445  PROT 7.1 5.9*  ALBUMIN 4.1  3.4*  AST 26 19  ALT 26 21  ALKPHOS 46 38*  BILITOT 1.2 0.7   Sedimentation Rate  Recent Labs  08/17/13 0445  ESRSEDRATE 17*   C-Reactive Protein  Recent Labs  08/16/13 1246  CRP 5.3*    Microbiology: Recent Results (from the past 240 hour(s))  CULTURE, BLOOD (ROUTINE X 2)     Status: None   Collection Time    08/16/13 12:50 PM      Result Value Range Status   Specimen Description BLOOD LEFT ARM   Final   Special Requests BOTTLES DRAWN AEROBIC AND ANAEROBIC 10CC   Final   Culture  Setup Time     Final   Value: 08/16/2013 17:41     Performed at [facility]   Culture     Final   Value:        BLOOD CULTURE RECEIVED NO GROWTH TO DATE CULTURE WILL BE HELD FOR 5 DAYS BEFORE ISSUING A FINAL NEGATIVE REPORT     Performed at [facility]   Report Status PENDING   Incomplete  CULTURE, BLOOD (ROUTINE X 2)     Status: None  Collection Time    08/16/13 12:55 PM      Result Value Range Status   Specimen Description BLOOD RIGHT ARM   Final   Special Requests BOTTLES DRAWN AEROBIC ONLY 10 ML   Final   Culture  Setup Time     Final   Value: 08/16/2013 17:41     Performed at [facility]   Culture     Final   Value:        BLOOD CULTURE RECEIVED NO GROWTH TO DATE CULTURE WILL BE HELD FOR 5 DAYS BEFORE ISSUING A FINAL NEGATIVE REPORT     Performed at [facility]   Report Status PENDING   Incomplete  URINE CULTURE     Status: None   Collection Time    08/16/13  1:00 PM      Result Value Range Status   Specimen Description URINE, CLEAN CATCH   Final   Special Requests NONE   Final   Culture  Setup Time     Final   Value: 08/16/2013 17:48     Performed at [facility] Count     Final   Value: NO GROWTH     Performed at [facility]   Culture     Final   Value: NO GROWTH     Performed at [facility]   Report Status 08/17/2013 FINAL   Final    Studies/Results: Dg Orthopantogram  08/16/2013   *RADIOLOGY  REPORT*  Clinical Data: Dental work several weeks ago on a left posterior mandibular tooth.  Evaluate for possible infection.  Prior history of endocarditis.  ORTHOPANTOGRAM/PANORAMIC  Comparison: None.  Findings: No periapical lucency involving any of the teeth to suggest abscess.  No intrinsic osseous abnormalities.  No visible dental caries.  Temporomandibular joints intact.  IMPRESSION: Normal examination.   Original Report Authenticated By: Hulan Saas, M.D.   Dg Chest 2 View  08/16/2013   *RADIOLOGY REPORT*  Clinical Data: Cough and chills.  CHEST - 2 VIEW  Comparison: None  Findings: The cardiac silhouette, mediastinal and hilar contours are within normal limits.  The lungs are clear.  No pleural effusion.  The bony thorax is intact.  IMPRESSION: No acute cardiopulmonary findings.   Original Report Authenticated By: Rudie Meyer, M.D.     Assessment/Plan: Endocarditis, presumed  Pt feels better, just wants to go home.  BCx ngtd Will send chlamydia serologies, ask lab to hold BCx for 2 weeks.  Day 2 of ceftraixone and vanco.  My great appreciation to Dr Isidoro Donning for arranging home health.  Will see him back in ID clinic in 2 weeks. Plan for 6 weeks of IV anbx.         Johny Sax Infectious Diseases (pager) [phone] [email or web address] 08/18/2013, 10:43 AM  LOS: 2 days

## 2013-08-18 NOTE — Progress Notes (Signed)
   Primary cardiologist: Dr. Donato Schultz  Subjective:   No specific complaints.   Objective:   Temp:  [97.3 F (36.3 C)-98.8 F (37.1 C)] 97.7 F (36.5 C) (08/30 0451) Pulse Rate:  [64-75] 72 (08/30 0451) Resp:  [12-25] 18 (08/30 0451) BP: (111-152)/(59-103) 121/59 mmHg (08/30 0451) SpO2:  [97 %-100 %] 97 % (08/30 0451) Weight:  [204 lb 12.9 oz (92.9 kg)] 204 lb 12.9 oz (92.9 kg) (08/30 0451) Last BM Date: 08/17/13  Filed Weights   08/16/13 1633 08/17/13 0455 08/18/13 0451  Weight: 209 lb (94.802 kg) 203 lb 7.8 oz (92.3 kg) 204 lb 12.9 oz (92.9 kg)    Intake/Output Summary (Last 24 hours) at 08/18/13 0851 Last data filed at 08/17/13 1736  Gross per 24 hour  Intake    240 ml  Output      0 ml  Net    240 ml   Telemetry: Sinus rhythm.  Exam:  General: NAD.  Lungs: Clear, nonlabored.  Cardiac: RRR, 2/6 diastolic murmur, no gallop.  Abdomen: NABS.  Extremities: No pitting.  Lab Results:  Basic Metabolic Panel:  Recent Labs Lab 08/16/13 1246 08/16/13 1819 08/17/13 0445  NA 136  --  140  K 3.6  --  3.6  CL 98  --  104  CO2 27  --  27  GLUCOSE 118*  --  107*  BUN 21  --  19  CREATININE 1.17 1.26 1.09  CALCIUM 9.4  --  8.6    Liver Function Tests:  Recent Labs Lab 08/16/13 1246 08/17/13 0445  AST 26 19  ALT 26 21  ALKPHOS 46 38*  BILITOT 1.2 0.7  PROT 7.1 5.9*  ALBUMIN 4.1 3.4*    CBC:  Recent Labs Lab 08/16/13 1246 08/16/13 1819 08/17/13 0445  WBC 9.4 6.8 5.3  HGB 16.1 14.1 14.1  HCT 43.6 40.4 40.0  MCV 89.7 90.0 89.7  PLT 113* 109* 104*    Cardiac Enzymes:  Recent Labs Lab 08/16/13 1246  TROPONINI <0.30     Medications:   Scheduled Medications: . atorvastatin  20 mg Oral Daily  . cefTRIAXone (ROCEPHIN)  IV  2 g Intravenous Q12H  . heparin  5,000 Units Subcutaneous Q8H  . irbesartan  150 mg Oral Daily   And  . hydrochlorothiazide  12.5 mg Oral Daily  . sodium chloride  3 mL Intravenous Q12H  . vancomycin  1,250 mg  Intravenous Q12H     Infusions: . sodium chloride 20 mL/hr (08/18/13 0301)     PRN Medications:  acetaminophen, albuterol, guaiFENesin-dextromethorphan, HYDROcodone-acetaminophen, ondansetron (ZOFRAN) IV, ondansetron, polyethylene glycol   Assessment:   1. Possible culture-negative tricuspid valve endocarditis based on recent workup including TEE and ID service evaluation. Currently on vancomycin and Rocephin.  2. History of aortic valve endocarditis 2008. There is moderate to severe aortic regurgitation by recent TEE, eccentric jet. No described vegetation.  3. Hypertension. Recent blood pressure is well-controlled.  Plan/Discussion:    Patient continues on antibiotics under the direction of the ID service. No other cardiac testing anticipated at this specific time, although valvular disease will need to be followed closely.   Jonelle Sidle, M.D., F.A.C.C.

## 2013-08-19 DIAGNOSIS — E7800 Pure hypercholesterolemia, unspecified: Secondary | ICD-10-CM | POA: Diagnosis not present

## 2013-08-19 DIAGNOSIS — I359 Nonrheumatic aortic valve disorder, unspecified: Secondary | ICD-10-CM | POA: Diagnosis not present

## 2013-08-19 DIAGNOSIS — R509 Fever, unspecified: Secondary | ICD-10-CM | POA: Diagnosis not present

## 2013-08-19 DIAGNOSIS — R5381 Other malaise: Secondary | ICD-10-CM | POA: Diagnosis not present

## 2013-08-19 DIAGNOSIS — I38 Endocarditis, valve unspecified: Secondary | ICD-10-CM | POA: Diagnosis not present

## 2013-08-19 MED ORDER — DEXTROSE 5 % IV SOLN: 2.0000 g | Freq: Two times a day (BID) | INTRAVENOUS | Status: DC

## 2013-08-19 MED ORDER — SODIUM CHLORIDE 0.9 % IJ SOLN: 10.0000 mL | INTRAMUSCULAR | Status: DC | PRN

## 2013-08-19 MED ORDER — HEPARIN SOD (PORK) LOCK FLUSH 100 UNIT/ML IV SOLN
250.0000 [IU] | INTRAVENOUS | Status: AC | PRN
  Administered 2013-08-19: 250 [IU]

## 2013-08-19 MED ORDER — VANCOMYCIN HCL 10 G IV SOLR: 1250.0000 mg | Freq: Two times a day (BID) | INTRAVENOUS | Status: DC

## 2013-08-19 NOTE — Progress Notes (Signed)
   Primary cardiologist: Dr. Donato Schultz  Subjective:   Up in chair, eager to go home. No chest pain or breathlessness.   Objective:   Temp:  [98.1 F (36.7 C)-98.5 F (36.9 C)] 98.2 F (36.8 C) (08/31 0429) Pulse Rate:  [76-77] 77 (08/31 0429) Resp:  [18] 18 (08/31 0429) BP: (120-147)/(69-74) 121/74 mmHg (08/31 0429) SpO2:  [95 %-100 %] 95 % (08/31 0429) Weight:  [192 lb 10.9 oz (87.4 kg)] 192 lb 10.9 oz (87.4 kg) (08/31 0500) Last BM Date: 08/18/13  Filed Weights   08/17/13 0455 08/18/13 0451 08/19/13 0500  Weight: 203 lb 7.8 oz (92.3 kg) 204 lb 12.9 oz (92.9 kg) 192 lb 10.9 oz (87.4 kg)    Telemetry: Sinus rhythm.  Exam:  General: NAD.  Lungs: Clear, nonlabored.  Cardiac: RRR, 2/6 diastolic murmur, no gallop.  Abdomen: NABS.  Extremities: No pitting.  Lab Results:   Basic Metabolic Panel:  Recent Labs Lab 08/16/13 1246 08/16/13 1819 08/17/13 0445  NA 136  --  140  K 3.6  --  3.6  CL 98  --  104  CO2 27  --  27  GLUCOSE 118*  --  107*  BUN 21  --  19  CREATININE 1.17 1.26 1.09  CALCIUM 9.4  --  8.6    Liver Function Tests:  Recent Labs Lab 08/16/13 1246 08/17/13 0445  AST 26 19  ALT 26 21  ALKPHOS 46 38*  BILITOT 1.2 0.7  PROT 7.1 5.9*  ALBUMIN 4.1 3.4*    CBC:  Recent Labs Lab 08/16/13 1246 08/16/13 1819 08/17/13 0445  WBC 9.4 6.8 5.3  HGB 16.1 14.1 14.1  HCT 43.6 40.4 40.0  MCV 89.7 90.0 89.7  PLT 113* 109* 104*     Medications:   Scheduled Medications: . atorvastatin  20 mg Oral Daily  . cefTRIAXone (ROCEPHIN)  IV  2 g Intravenous Q12H  . heparin  5,000 Units Subcutaneous Q8H  . irbesartan  150 mg Oral Daily   And  . hydrochlorothiazide  12.5 mg Oral Daily  . sodium chloride  10-40 mL Intracatheter Q12H  . sodium chloride  3 mL Intravenous Q12H  . vancomycin  1,250 mg Intravenous Q12H    Infusions: . sodium chloride 20 mL (08/18/13 1537)    PRN Medications: acetaminophen, albuterol,  guaiFENesin-dextromethorphan, HYDROcodone-acetaminophen, ondansetron (ZOFRAN) IV, ondansetron, polyethylene glycol, sodium chloride   Assessment:   1. Possible culture-negative tricuspid valve endocarditis based on recent workup including TEE and ID service evaluation. PICC line placed yesterday with plan to go home on antibiotics via home health.  2. History of aortic valve endocarditis 2008. There is moderate to severe aortic regurgitation by recent TEE, eccentric jet. No described vegetation.  3. Hypertension. Recent blood pressure is well-controlled.  Plan/Discussion:    Patient plans to be going home with home health on antibiotics. He will have followup with the ID service, also tells me that he already has a visit to see Dr. Anne Fu on Wednesday which she will keep.   Jonelle Sidle, M.D., F.A.C.C.

## 2013-08-19 NOTE — Progress Notes (Signed)
   CARE MANAGEMENT NOTE 08/19/2013  Patient:  [facility]   Account Number:  [id number]  Date Initiated:  08/17/2013  Documentation initiated by:  AMERSON,JULIE  Subjective/Objective Assessment:   PT ADM ON 08/16/13 WITH FEVER, ?ENDOCARDITIS.  PTA, PT INDEPENDENT, LIVES WITH SPOUSE.     Action/Plan:   WILL FOLLOW FOR DISCHARGE NEEDS AS PT PROGRESSES.   Anticipated DC Date:  08/21/2013   Anticipated DC Plan:  HOME W HOME HEALTH SERVICES      DC Planning Services  CM consult      [facility] Choice  HOME HEALTH   Choice offered to / List presented to:  C-1 Patient        HH arranged  HH-1 RN      [facility] agency  [facility]   Status of service:  Completed, signed off Medicare Important Message given?   (If response is "NO", the following Medicare IM given date fields will be blank) Date Medicare IM given:   Date Additional Medicare IM given:    Discharge Disposition:  HOME W HOME HEALTH SERVICES  Per UR Regulation:  Reviewed for med. necessity/level of care/duration of stay  If discussed at Long Length of Stay Meetings, dates discussed:    Comments:  08/19/13 08:00 Cm met with pt to offer choice.  Pt chose [facility] for HHRN.  Referral and prescriptions  was faxed.  No other CM needs were communicated.  Freddy Jaksch, BSN, CM

## 2013-08-19 NOTE — Progress Notes (Signed)
   CARE MANAGEMENT NOTE 08/19/2013  Patient:  [facility]   Account Number:  [id number]  Date Initiated:  08/17/2013  Documentation initiated by:  AMERSON,JULIE  Subjective/Objective Assessment:   PT ADM ON 08/16/13 WITH FEVER, ?ENDOCARDITIS.  PTA, PT INDEPENDENT, LIVES WITH SPOUSE.     Action/Plan:   WILL FOLLOW FOR DISCHARGE NEEDS AS PT PROGRESSES.   Anticipated DC Date:  08/21/2013   Anticipated DC Plan:  HOME W HOME HEALTH SERVICES      DC Planning Services  CM consult      [facility] Choice  HOME HEALTH   Choice offered to / List presented to:  C-1 Patient        HH arranged  HH-1 RN      [facility] agency  [facility]   Status of service:  Completed, signed off Medicare Important Message given?   (If response is "NO", the following Medicare IM given date fields will be blank) Date Medicare IM given:   Date Additional Medicare IM given:    Discharge Disposition:  HOME W HOME HEALTH SERVICES  Per UR Regulation:  Reviewed for med. necessity/level of care/duration of stay  If discussed at Long Length of Stay Meetings, dates discussed:    Comments:  08/19/13 12:20 CM faxed addendum to [facility] for [facility] address for pt.  Freddy Jaksch, BSN, [place] [phone]  08/19/13 08:00 Cm met with pt to offer choice.  Pt chose [facility] for HHRN.  Referral and prescriptions  was faxed.  No other CM needs were communicated.  Freddy Jaksch, BSN, CM

## 2013-08-19 NOTE — Progress Notes (Signed)
INFECTIOUS DISEASE PROGRESS NOTE  ID: Eric Chambers is a 67 y.o. male with  Principal Problem:   FUO (fever of unknown origin) Active Problems:   High cholesterol   Hypertension   Enlarged prostate   Endocarditis   OSA (obstructive sleep apnea)   Aortic regurgitation  Subjective: Without complaints  Abtx:  Anti-infectives   Start     Dose/Rate Route Frequency Ordered Stop   08/19/13 0000  dextrose 5 % SOLN 50 mL with cefTRIAXone 2 G SOLR 2 g  Status:  Discontinued     2 g 100 mL/hr over 30 Minutes Intravenous Every 12 hours 08/19/13 0947 08/19/13    08/19/13 0000  sodium chloride 0.9 % SOLN 250 mL with vancomycin 10 G SOLR 1,250 mg  Status:  Discontinued     1,250 mg 166.7 mL/hr over 90 Minutes Intravenous Every 12 hours 08/19/13 0947 08/19/13    08/19/13 0000  dextrose 5 % SOLN 50 mL with cefTRIAXone 2 G SOLR 2 g     2 g 100 mL/hr over 30 Minutes Intravenous Every 12 hours 08/19/13 0948     08/19/13 0000  sodium chloride 0.9 % SOLN 250 mL with vancomycin 10 G SOLR 1,250 mg     1,250 mg 166.7 mL/hr over 90 Minutes Intravenous Every 12 hours 08/19/13 0948     08/17/13 1600  vancomycin (VANCOCIN) 1,250 mg in sodium chloride 0.9 % 250 mL IVPB     1,250 mg 166.7 mL/hr over 90 Minutes Intravenous Every 12 hours 08/17/13 1540     08/17/13 1530  cefTRIAXone (ROCEPHIN) 2 g in dextrose 5 % 50 mL IVPB     2 g 100 mL/hr over 30 Minutes Intravenous Every 12 hours 08/17/13 1519        Medications:  Scheduled: . atorvastatin  20 mg Oral Daily  . cefTRIAXone (ROCEPHIN)  IV  2 g Intravenous Q12H  . heparin  5,000 Units Subcutaneous Q8H  . irbesartan  150 mg Oral Daily   And  . hydrochlorothiazide  12.5 mg Oral Daily  . sodium chloride  10-40 mL Intracatheter Q12H  . sodium chloride  3 mL Intravenous Q12H  . vancomycin  1,250 mg Intravenous Q12H    Objective: Vital signs in last 24 hours: Temp:  [98.1 F (36.7 C)-98.5 F (36.9 C)] 98.2 F (36.8 C) (08/31 0429) Pulse  Rate:  [76-77] 77 (08/31 0429) Resp:  [18] 18 (08/31 0429) BP: (120-147)/(69-74) 121/74 mmHg (08/31 0429) SpO2:  [95 %-100 %] 95 % (08/31 0429) Weight:  [87.4 kg (192 lb 10.9 oz)] 87.4 kg (192 lb 10.9 oz) (08/31 0500)   General appearance: alert, cooperative and no distress Resp: clear to auscultation bilaterally Cardio: regular rate and rhythm GI: normal findings: bowel sounds normal and soft, non-tender  Lab Results  Recent Labs  08/16/13 1819 08/17/13 0445  WBC 6.8 5.3  HGB 14.1 14.1  HCT 40.4 40.0  NA  --  140  K  --  3.6  CL  --  104  CO2  --  27  BUN  --  19  CREATININE 1.26 1.09   Liver Panel  Recent Labs  08/17/13 0445  PROT 5.9*  ALBUMIN 3.4*  AST 19  ALT 21  ALKPHOS 38*  BILITOT 0.7   Sedimentation Rate  Recent Labs  08/17/13 0445  ESRSEDRATE 17*   C-Reactive Protein No results found for this basename: CRP,  in the last 72 hours  Microbiology: Recent Results (from the past 240  hour(s))  CULTURE, BLOOD (ROUTINE X 2)     Status: None   Collection Time    08/16/13 12:50 PM      Result Value Range Status   Specimen Description BLOOD LEFT ARM   Final   Special Requests BOTTLES DRAWN AEROBIC AND ANAEROBIC 10CC   Final   Culture  Setup Time     Final   Value: 08/16/2013 17:41     Performed at [facility]   Culture     Final   Value:        BLOOD CULTURE RECEIVED NO GROWTH TO DATE CULTURE WILL BE HELD FOR 5 DAYS BEFORE ISSUING A FINAL NEGATIVE REPORT     Performed at [facility]   Report Status PENDING   Incomplete  CULTURE, BLOOD (ROUTINE X 2)     Status: None   Collection Time    08/16/13 12:55 PM      Result Value Range Status   Specimen Description BLOOD RIGHT ARM   Final   Special Requests BOTTLES DRAWN AEROBIC ONLY 10 ML   Final   Culture  Setup Time     Final   Value: 08/16/2013 17:41     Performed at [facility]   Culture     Final   Value:        BLOOD CULTURE RECEIVED NO GROWTH TO DATE CULTURE WILL BE  HELD FOR 5 DAYS BEFORE ISSUING A FINAL NEGATIVE REPORT     Performed at [facility]   Report Status PENDING   Incomplete  URINE CULTURE     Status: None   Collection Time    08/16/13  1:00 PM      Result Value Range Status   Specimen Description URINE, CLEAN CATCH   Final   Special Requests NONE   Final   Culture  Setup Time     Final   Value: 08/16/2013 17:48     Performed at [facility] Count     Final   Value: NO GROWTH     Performed at [facility]   Culture     Final   Value: NO GROWTH     Performed at [facility]   Report Status 08/17/2013 FINAL   Final  CULTURE, BLOOD (SINGLE)     Status: None   Collection Time    08/16/13  8:40 PM      Result Value Range Status   Specimen Description BLOOD LEFT ARM   Final   Special Requests BOTTLES DRAWN AEROBIC ONLY 3CC   Final   Culture  Setup Time     Final   Value: 08/17/2013 05:28     Performed at [facility]   Culture     Final   Value:        BLOOD CULTURE RECEIVED NO GROWTH TO DATE CULTURE WILL BE HELD FOR 5 DAYS BEFORE ISSUING A FINAL NEGATIVE REPORT     Performed at [facility]   Report Status PENDING   Incomplete    Studies/Results: No results found.   Assessment/Plan: Endocarditis, Cx-, recurrent?  Total days of antibiotics: 3 vanco/ceftriaxone  Plan for 6 weeks of anbx Will have him f/u in ID clinic in 2-3 weeks.  Serologies for chlamydia pending I have asked lab to hold BCx for 2 weeks.  available if questions         Johny Sax Infectious Diseases (pager) [phone] [email or web address] 08/19/2013,  1:12 PM  LOS: 3 days

## 2013-08-19 NOTE — Progress Notes (Signed)
Discharged to home with family office visits in place teaching done  

## 2013-08-19 NOTE — Discharge Summary (Signed)
Physician Discharge Summary  Patient ID: Eric Chambers MRN: [id number] DOB/AGE: 1946-02-05 67 y.o.  Admit date: 08/16/2013 Discharge date: 08/19/2013  Primary Care Physician:  [name], MD  Discharge Diagnoses:   . Endocarditis- recurrent second episode, first episode in 2009  . OSA (obstructive sleep apnea) . Enlarged prostate . High cholesterol . Hypertension . moderate to severe Aortic regurgitation  Consults:  Cardiology, Dr. Chales Abrahams                    Infectious disease, Dr. Ninetta Lights  Recommendations for Outpatient Follow-up:  1. home health arranged for IV antibiotics 2. patient will need to have BMET, CBC, vanc trough drawn every week- fax results to Dr. Ninetta Lights 3. close monitoring of moderate to severe aortic regurgitation   Allergies:  No Known Allergies   Discharge Medications:   Medication List         atorvastatin 20 MG tablet  Commonly known as:  LIPITOR  Take 20 mg by mouth daily.     dextrose 5 % SOLN 50 mL with cefTRIAXone 2 G SOLR 2 g  Inject 2 g into the vein every 12 (twelve) hours. X 6 weeks     sodium chloride 0.9 % injection  10-40 mLs by Intracatheter route as needed (flush).     sodium chloride 0.9 % SOLN 250 mL with vancomycin 10 G SOLR 1,250 mg  Inject 1,250 mg into the vein every 12 (twelve) hours. X 6 weeks     telmisartan-hydrochlorothiazide 40-12.5 MG per tablet  Commonly known as:  MICARDIS HCT  Take 1 tablet by mouth daily.         Brief H and P: For complete details please refer to admission H and P, but in brief Eric Chambers is a 67 y.o. male, who is in the process of moving from [place] with history of endocarditis in 2009 for which he was treated in [place] with IV antibiotics for 6 weeks (stated that he got the infection after dental work) hypertension, dyslipidemia, obstructive sleep apnea does not wear CPAP.  He was in his usual state of health till he had some crown work done to his teeth about 5 weeks ago  and subsequently started experiencing low-grade fever chills, generalized fatigue and night sweats, also complained of some unintentional weight loss. Symptoms progressively worsened over the last week, mild dry cough but no other subjective complaints. He felt that his symptoms were similar to his endocarditis episode in 2009. In ER, had temperature of 99.4, initial blood work along with chest x-ray and UA were unremarkable.   Hospital Course:    Fevers: history of endocarditis in the past in 2009, and recent dental work, recurrent endocarditis, History of aortic valve endocarditis 2009. Patient was admitted for further workup. Cardiology was consulted and he underwent TEE on 08/17/13.  TEE per Dr. Anne Fu concerning structure in the right ventricle/atrium at time strand-like, chaotic motion associated with tricuspid valve, cannot exclude vegetation. Moderate to severe aortic regurgitation.  Blood cultures remained negative so far. Infectious disease was consulted, patient was followed by Dr. Ninetta Lights. He was placed on IV vancomycin and Rocephin, he will need for 6 weeks.  PICC line was placed on 08/18/13 for prolonged IV antibiotics. Home health was set up with case management assistance.    Hypertension - remained stable  Obstructive sleep apnea does not wear CPAP, oxygen if needed at nighttime.We'll defer to PCP post discharge.   Moderate to severe aortic regurgitation- patient follows Dr. Anne Fu,  [facility] cardiology.  Day of Discharge BP 121/74  Pulse 77  Temp(Src) 98.2 F (36.8 C) (Oral)  Resp 18  Ht 5\' 9"  (1.753 m)  Wt 87.4 kg (192 lb 10.9 oz)  BMI 28.44 kg/m2  SpO2 95%  Physical Exam: General: Alert and awake, oriented x3, NAD  CVS: S1-S2 clear, 2/6 murmur at the left midsternal border  Chest: CTAB  Abdomen: soft NT, ND, NBS  Extremities: no c/c/e bilaterally Neuro: Cranial nerves II-XII intact, no focal neurological deficits  The results of significant diagnostics from this  hospitalization (including imaging, microbiology, ancillary and laboratory) are listed below for reference.    LAB RESULTS: Basic Metabolic Panel:  Recent Labs Lab 08/16/13 1246 08/16/13 1819 08/17/13 0445  NA 136  --  140  K 3.6  --  3.6  CL 98  --  104  CO2 27  --  27  GLUCOSE 118*  --  107*  BUN 21  --  19  CREATININE 1.17 1.26 1.09  CALCIUM 9.4  --  8.6   Liver Function Tests:  Recent Labs Lab 08/16/13 1246 08/17/13 0445  AST 26 19  ALT 26 21  ALKPHOS 46 38*  BILITOT 1.2 0.7  PROT 7.1 5.9*  ALBUMIN 4.1 3.4*   No results found for this basename: LIPASE, AMYLASE,  in the last 168 hours No results found for this basename: AMMONIA,  in the last 168 hours CBC:  Recent Labs Lab 08/16/13 1246 08/16/13 1819 08/17/13 0445  WBC 9.4 6.8 5.3  NEUTROABS 7.8*  --   --   HGB 16.1 14.1 14.1  HCT 43.6 40.4 40.0  MCV 89.7 90.0 89.7  PLT 113* 109* 104*   Cardiac Enzymes:  Recent Labs Lab 08/16/13 1246  TROPONINI <0.30   BNP: No components found with this basename: POCBNP,  CBG: No results found for this basename: GLUCAP,  in the last 168 hours  Significant Diagnostic Studies:  Dg Orthopantogram  08/16/2013   *RADIOLOGY REPORT*  Clinical Data: Dental work several weeks ago on a left posterior mandibular tooth.  Evaluate for possible infection.  Prior history of endocarditis.  ORTHOPANTOGRAM/PANORAMIC  Comparison: None.  Findings: No periapical lucency involving any of the teeth to suggest abscess.  No intrinsic osseous abnormalities.  No visible dental caries.  Temporomandibular joints intact.  IMPRESSION: Normal examination.   Original Report Authenticated By: Hulan Saas, M.D.   Dg Chest 2 View  08/16/2013   *RADIOLOGY REPORT*  Clinical Data: Cough and chills.  CHEST - 2 VIEW  Comparison: None  Findings: The cardiac silhouette, mediastinal and hilar contours are within normal limits.  The lungs are clear.  No pleural effusion.  The bony thorax is intact.   IMPRESSION: No acute cardiopulmonary findings.   Original Report Authenticated By: Rudie Meyer, M.D.    TEE 08/17/13 Study Conclusions  - Left ventricle: The cavity size was normal. Wall thickness was normal. Systolic function was normal. The estimated ejection fraction was in the range of 55% to 60%. Wall motion was normal; there were no regional wall motion abnormalities. - Aortic valve: Moderate to severe regurgitation directed eccentrically in the LVOT and along the septum. - Mitral valve: No evidence of vegetation. - Left atrium: No evidence of thrombus in the atrial cavity or appendage. No evidence of thrombus in the appendage. - Right atrium: No evidence of thrombus in the atrial cavity or appendage. - Atrial septum: No defect or patent foramen ovale was identified. - Tricuspid valve: There is echogenic  strandlike object in the right ventricle associated with the lateral aspect of the tricuspid valve chaotic in motion which is concerning for possible vegetation. This was seen best on transthoracic approach. - Pulmonic valve: No evidence of vegetation.    Disposition and Follow-up:    DISPOSITION home  DIET heart healthy diet  ACTIVITY: As tolerated  TESTS THAT NEED FOLLOW-UP CBC, BMET, vancomycin trough weekly  DISCHARGE FOLLOW-UP     Follow-up Information   Follow up with Johny Sax, MD. Schedule an appointment as soon as possible for a visit in 2 weeks. (for follow-up. Please call on Tuesday to make the appt.)    Specialty:  Infectious Diseases   Contact information:   [address]  [address] [place] [address] [phone]       Follow up with [name], MD. Schedule an appointment as soon as possible for a visit in 2 weeks.   Specialty:  Family Medicine   Contact information:   [address] [place] [address] [phone]       Follow up with Donato Schultz, MD In 2 weeks.   Specialty:   Cardiology   Contact information:   [address] [place] [address] [phone]       Time spent on Discharge: 40 mins  Signed:   Rether Rison M.D. [facility] 08/19/2013, 9:56 AM Pager: [phone]

## 2013-08-20 ENCOUNTER — Encounter (HOSPITAL_COMMUNITY): Payer: Self-pay | Admitting: Cardiology

## 2013-08-20 DIAGNOSIS — Z794 Long term (current) use of insulin: Secondary | ICD-10-CM | POA: Diagnosis not present

## 2013-08-20 DIAGNOSIS — I38 Endocarditis, valve unspecified: Secondary | ICD-10-CM | POA: Diagnosis not present

## 2013-08-20 DIAGNOSIS — I10 Essential (primary) hypertension: Secondary | ICD-10-CM | POA: Diagnosis not present

## 2013-08-20 DIAGNOSIS — G4730 Sleep apnea, unspecified: Secondary | ICD-10-CM | POA: Diagnosis not present

## 2013-08-20 DIAGNOSIS — Z452 Encounter for adjustment and management of vascular access device: Secondary | ICD-10-CM | POA: Diagnosis not present

## 2013-08-20 DIAGNOSIS — I359 Nonrheumatic aortic valve disorder, unspecified: Secondary | ICD-10-CM | POA: Diagnosis not present

## 2013-08-20 LAB — QUANTIFERON TB GOLD ASSAY (BLOOD)
TB Ag value: 0.036 IU/mL
TB Antigen Minus Nil Value: 0.01 IU/mL

## 2013-08-22 LAB — CULTURE, BLOOD (ROUTINE X 2): Culture: NO GROWTH

## 2013-08-23 LAB — CULTURE, BLOOD (SINGLE): Culture: NO GROWTH

## 2013-08-25 LAB — CHLAMYDIA ANTIBODIES, IGG
Chlamydia Pneumoniae IgG: 1:64 {titer} — ABNORMAL HIGH
Chlamydia trachomatis IgG: <1:64 {titer}

## 2013-08-27 DIAGNOSIS — I359 Nonrheumatic aortic valve disorder, unspecified: Secondary | ICD-10-CM | POA: Diagnosis not present

## 2013-08-27 DIAGNOSIS — M25569 Pain in unspecified knee: Secondary | ICD-10-CM | POA: Diagnosis not present

## 2013-08-27 DIAGNOSIS — Z466 Encounter for fitting and adjustment of urinary device: Secondary | ICD-10-CM | POA: Diagnosis not present

## 2013-08-27 DIAGNOSIS — I38 Endocarditis, valve unspecified: Secondary | ICD-10-CM | POA: Diagnosis not present

## 2013-08-29 ENCOUNTER — Encounter (HOSPITAL_COMMUNITY): Payer: Self-pay | Admitting: Emergency Medicine

## 2013-08-29 ENCOUNTER — Telehealth: Payer: Self-pay | Admitting: *Deleted

## 2013-08-29 ENCOUNTER — Emergency Department (HOSPITAL_COMMUNITY): Payer: Medicare Other

## 2013-08-29 ENCOUNTER — Inpatient Hospital Stay (HOSPITAL_COMMUNITY)
Admission: EM | Admit: 2013-08-29 | Discharge: 2013-09-03 | DRG: 392 | Disposition: A | Discharge status: Home Health | Payer: Medicare Other | Attending: Internal Medicine | Admitting: Internal Medicine

## 2013-08-29 DIAGNOSIS — K921 Melena: Secondary | ICD-10-CM | POA: Diagnosis present

## 2013-08-29 DIAGNOSIS — N281 Cyst of kidney, acquired: Secondary | ICD-10-CM | POA: Diagnosis not present

## 2013-08-29 DIAGNOSIS — E860 Dehydration: Secondary | ICD-10-CM | POA: Diagnosis present

## 2013-08-29 DIAGNOSIS — Z79899 Other long term (current) drug therapy: Secondary | ICD-10-CM | POA: Diagnosis not present

## 2013-08-29 DIAGNOSIS — E7800 Pure hypercholesterolemia, unspecified: Secondary | ICD-10-CM

## 2013-08-29 DIAGNOSIS — K922 Gastrointestinal hemorrhage, unspecified: Secondary | ICD-10-CM | POA: Diagnosis not present

## 2013-08-29 DIAGNOSIS — I10 Essential (primary) hypertension: Secondary | ICD-10-CM | POA: Diagnosis present

## 2013-08-29 DIAGNOSIS — I079 Rheumatic tricuspid valve disease, unspecified: Secondary | ICD-10-CM | POA: Diagnosis present

## 2013-08-29 DIAGNOSIS — R509 Fever, unspecified: Secondary | ICD-10-CM | POA: Diagnosis not present

## 2013-08-29 DIAGNOSIS — D62 Acute posthemorrhagic anemia: Secondary | ICD-10-CM | POA: Diagnosis present

## 2013-08-29 DIAGNOSIS — I351 Nonrheumatic aortic (valve) insufficiency: Secondary | ICD-10-CM

## 2013-08-29 DIAGNOSIS — I38 Endocarditis, valve unspecified: Secondary | ICD-10-CM | POA: Diagnosis present

## 2013-08-29 DIAGNOSIS — G4733 Obstructive sleep apnea (adult) (pediatric): Secondary | ICD-10-CM | POA: Diagnosis present

## 2013-08-29 DIAGNOSIS — R1110 Vomiting, unspecified: Secondary | ICD-10-CM | POA: Diagnosis present

## 2013-08-29 DIAGNOSIS — M25569 Pain in unspecified knee: Secondary | ICD-10-CM | POA: Diagnosis not present

## 2013-08-29 DIAGNOSIS — N400 Enlarged prostate without lower urinary tract symptoms: Secondary | ICD-10-CM

## 2013-08-29 DIAGNOSIS — N179 Acute kidney failure, unspecified: Secondary | ICD-10-CM | POA: Diagnosis present

## 2013-08-29 DIAGNOSIS — R112 Nausea with vomiting, unspecified: Secondary | ICD-10-CM | POA: Diagnosis not present

## 2013-08-29 DIAGNOSIS — R197 Diarrhea, unspecified: Principal | ICD-10-CM | POA: Diagnosis present

## 2013-08-29 DIAGNOSIS — E876 Hypokalemia: Secondary | ICD-10-CM | POA: Diagnosis not present

## 2013-08-29 DIAGNOSIS — I330 Acute and subacute infective endocarditis: Secondary | ICD-10-CM | POA: Diagnosis not present

## 2013-08-29 LAB — CBC WITH DIFFERENTIAL/PLATELET
Eosinophils Relative: 1 % (ref 0–5)
HCT: 32 % — ABNORMAL LOW (ref 39.0–52.0)
Hemoglobin: 11.5 g/dL — ABNORMAL LOW (ref 13.0–17.0)
Lymphs Abs: 0.8 10*3/uL (ref 0.7–4.0)
MCV: 86.5 fL (ref 78.0–100.0)
Monocytes Relative: 12 % (ref 3–12)
Platelets: 137 10*3/uL — ABNORMAL LOW (ref 150–400)
RBC: 3.7 MIL/uL — ABNORMAL LOW (ref 4.22–5.81)
WBC: 6.3 10*3/uL (ref 4.0–10.5)

## 2013-08-29 LAB — COMPREHENSIVE METABOLIC PANEL
ALT: 29 U/L (ref 0–53)
AST: 18 U/L (ref 0–37)
CO2: 25 mEq/L (ref 19–32)
Chloride: 104 mEq/L (ref 96–112)
Creatinine, Ser: 1.64 mg/dL — ABNORMAL HIGH (ref 0.50–1.35)
GFR calc non Af Amer: 42 mL/min — ABNORMAL LOW (ref >90)
Total Bilirubin: 0.4 mg/dL (ref 0.3–1.2)

## 2013-08-29 MED ORDER — POTASSIUM CHLORIDE 10 MEQ/100ML IV SOLN: 10.0000 meq | INTRAVENOUS | Status: DC

## 2013-08-29 MED ORDER — SODIUM CHLORIDE 0.9 % IV SOLN
INTRAVENOUS | Status: AC
  Administered 2013-08-29: 19:00:00 via INTRAVENOUS

## 2013-08-29 MED ORDER — SODIUM CHLORIDE 0.9 % IV BOLUS (SEPSIS)
1000.0000 mL | Freq: Once | INTRAVENOUS | Status: AC
  Administered 2013-08-29: 1000 mL via INTRAVENOUS

## 2013-08-29 MED ORDER — ACETAMINOPHEN 325 MG PO TABS: 650.0000 mg | ORAL_TABLET | Freq: Four times a day (QID) | ORAL | Status: DC | PRN

## 2013-08-29 MED ORDER — ONDANSETRON HCL 4 MG/2ML IJ SOLN
4.0000 mg | Freq: Three times a day (TID) | INTRAMUSCULAR | Status: AC | PRN
  Administered 2013-08-30: 01:00:00 4 mg via INTRAVENOUS
  Filled 2013-08-29: qty 2

## 2013-08-29 MED ORDER — OXYCODONE HCL 5 MG PO TABS
5.0000 mg | ORAL_TABLET | ORAL | Status: DC | PRN
  Administered 2013-08-31: 5 mg via ORAL
  Filled 2013-08-29: qty 1

## 2013-08-29 MED ORDER — ACETAMINOPHEN 650 MG RE SUPP: 650.0000 mg | Freq: Four times a day (QID) | RECTAL | Status: DC | PRN

## 2013-08-29 MED ORDER — ACETAMINOPHEN 325 MG PO TABS
650.0000 mg | ORAL_TABLET | Freq: Once | ORAL | Status: AC
  Administered 2013-08-29: 650 mg via ORAL
  Filled 2013-08-29: qty 2

## 2013-08-29 MED ORDER — ASPIRIN EC 81 MG PO TBEC
81.0000 mg | DELAYED_RELEASE_TABLET | Freq: Every day | ORAL | Status: DC
  Administered 2013-08-29 – 2013-09-03 (×6): 81 mg via ORAL
  Filled 2013-08-29 (×6): qty 1

## 2013-08-29 MED ORDER — POTASSIUM CHLORIDE CRYS ER 20 MEQ PO TBCR
40.0000 meq | EXTENDED_RELEASE_TABLET | Freq: Once | ORAL | Status: AC
  Administered 2013-08-29: 40 meq via ORAL
  Filled 2013-08-29: qty 2

## 2013-08-29 MED ORDER — VANCOMYCIN HCL 10 G IV SOLR: 1250.0000 mg | Freq: Two times a day (BID) | INTRAVENOUS | Status: DC

## 2013-08-29 MED ORDER — ONDANSETRON HCL 4 MG/2ML IJ SOLN
4.0000 mg | Freq: Once | INTRAMUSCULAR | Status: AC
  Administered 2013-08-29: 4 mg via INTRAVENOUS
  Filled 2013-08-29: qty 2

## 2013-08-29 MED ORDER — SODIUM CHLORIDE 0.9 % IJ SOLN: 10.0000 mL | INTRAMUSCULAR | Status: DC | PRN

## 2013-08-29 MED ORDER — VANCOMYCIN HCL IN DEXTROSE 1-5 GM/200ML-% IV SOLN
1000.0000 mg | Freq: Two times a day (BID) | INTRAVENOUS | Status: DC
  Administered 2013-08-30: 01:00:00 1000 mg via INTRAVENOUS
  Filled 2013-08-29 (×3): qty 200

## 2013-08-29 MED ORDER — DEXTROSE 5 % IV SOLN
2.0000 g | Freq: Two times a day (BID) | INTRAVENOUS | Status: DC
  Administered 2013-08-29 – 2013-08-31 (×3): 2 g via INTRAVENOUS
  Filled 2013-08-29 (×6): qty 2

## 2013-08-29 MED ORDER — SODIUM CHLORIDE 0.9 % IV SOLN
INTRAVENOUS | Status: DC
  Administered 2013-08-29: 23:00:00 via INTRAVENOUS

## 2013-08-29 MED ORDER — ATORVASTATIN CALCIUM 20 MG PO TABS
20.0000 mg | ORAL_TABLET | Freq: Every day | ORAL | Status: DC
  Administered 2013-08-29 – 2013-09-02 (×5): 20 mg via ORAL
  Filled 2013-08-29 (×5): qty 1

## 2013-08-29 MED ORDER — POTASSIUM CHLORIDE 10 MEQ/100ML IV SOLN
10.0000 meq | INTRAVENOUS | Status: AC
  Administered 2013-08-29 – 2013-08-30 (×2): 10 meq via INTRAVENOUS
  Filled 2013-08-29 (×2): qty 100

## 2013-08-29 MED ORDER — IBUPROFEN 400 MG PO TABS
400.0000 mg | ORAL_TABLET | Freq: Four times a day (QID) | ORAL | Status: DC | PRN
  Filled 2013-08-29: qty 1

## 2013-08-29 NOTE — Telephone Encounter (Signed)
Pt's wife called, reporting severe nausea with vomiting and diarrhea that appears to have blood in it.  Pt cannot even drink fluids without vomiting.  Labs drawn 08/27/13 received from [facility] this morning show pt's vanc trough 23.2, K+ 3.1, creatinine 1.56.  Per Dr. Ninetta Lights, pt is advised to go to the ED for evaluation.  Pt's wife in agreement. Andree Coss, RN

## 2013-08-29 NOTE — ED Notes (Signed)
IV team at bedside 

## 2013-08-29 NOTE — H&P (Signed)
[facility] History and Physical  [name] ZOX:[id number] DOB: Jun 27, 1946 DOA: 08/29/2013  Referring physician: Dr. Ninetta Lights PCP: [name], MD  Specialists:   Chief Complaint: Vomiting/diarrhea  HPI: Eric Chambers is a 67 y.o. male Patient was admitted on 08/16/13 for concern for endocarditis. Patient had been experiencing low-grade fevers, chills, sweats for several weeks after having a dental procedure. During his admission Dr. Ninetta Lights of infectious disease was consulted and patient was placed on IV Rocephin and vancomycin for a 6 week course.patient had a echo during his admission that showed possible valvular vegetation. Blood cultures were negative. Since starting the IV antibiotics patient states he has had increased diarrhea, nausea and vomiting. Had blood work performed earlier this week by Dr. Ninetta Lights showed increase in his creatinine and decrease his potassium. Patient also had supratherapeutic levels of vancomycin. The patient's wife called in to Dr. Tyrone Apple office and informed him that he had been vomiting last night several times. Patient was instructed to come to the emergency department  Patient admits to possible blood in his stool last night. No melena. No vomiting blood. Patient also complains of left knee pain for 5 days after twisting his knee. He has an appointment to follow up with an orthopedist. There is no swelling or warmth. Pain is worse with range of motion of the left knee. States his PICC line is running very slowly and medical personnel have been unable to draw blood from it   Review of Systems: The patient denies anorexia,  weight loss,, vision loss, decreased hearing, hoarseness, chest pain, syncope, dyspnea on exertion, peripheral edema, balance deficits, hemoptysis, abdominal pain,  hematochezia, severe indigestion/heartburn, hematuria, incontinence, genital sores, muscle weakness, suspicious skin lesions, transient blindness, difficulty walking,  depression, unusual weight change, abnormal bleeding, enlarged lymph nodes, angioedema, and breast masses.    Past Medical History  Diagnosis Date  . Endocarditis   . High cholesterol   . Hypertension   . Enlarged prostate   . Heart murmur     "think I have one now" (08/16/2013)  . Pneumonia     "almost once/yr" (08/16/2013)  . Sleep apnea     "don't wear mask" (08/16/2013)   Past Surgical History  Procedure Laterality Date  . Tonsillectomy  ~ 1957  . Inguinal hernia repair Right ~ 1950  . Umbilical hernia repair  ~ 2011  . Tee without cardioversion N/A 08/17/2013    Procedure: TRANSESOPHAGEAL ECHOCARDIOGRAM (TEE);  Surgeon: Donato Schultz, MD;  Location: [facility] ENDOSCOPY;  Service: Cardiovascular;  Laterality: N/A;   Social History:  reports that he has never smoked. He has never used smokeless tobacco. He reports that he drinks about 1.2 ounces of alcohol per week. He reports that he does not use illicit drugs.    No Known Allergies  Family History  Problem Relation Age of Onset  . Hypertension Mother   . Heart attack Father 55     Prior to Admission medications   Medication Sig Start Date End Date Taking? Authorizing Provider  atorvastatin (LIPITOR) 20 MG tablet Take 20 mg by mouth daily.   Yes Historical Provider, MD  dextrose 5 % SOLN 50 mL with cefTRIAXone 2 G SOLR 2 g Inject 2 g into the vein every 12 (twelve) hours. X 6 weeks 08/19/13  Yes Ripudeep Jenna Luo, MD  ibuprofen (ADVIL,MOTRIN) 200 MG tablet Take 400 mg by mouth every 6 (six) hours as needed for pain.   Yes Historical Provider, MD  sodium chloride 0.9 % injection 10-40  mLs by Intracatheter route as needed (flush). 08/19/13  Yes Ripudeep K Rai, MD  sodium chloride 0.9 % SOLN 250 mL with vancomycin 10 G SOLR 1,250 mg Inject 1,250 mg into the vein every 12 (twelve) hours. X 6 weeks 08/19/13  Yes Ripudeep Jenna Luo, MD  telmisartan-hydrochlorothiazide (MICARDIS HCT) 40-12.5 MG per tablet Take 1 tablet by mouth daily.   Yes  Historical Provider, MD   Physical Exam: Filed Vitals:   08/29/13 1500 08/29/13 1545 08/29/13 1630 08/29/13 1800  BP: 126/63 125/67 126/63 135/70  Pulse: 78 78 75 74  Temp:      TempSrc:      Resp: 17 18 19 20   Height:      Weight:      SpO2: 94% 96% 97% 98%     General:  Alert and oriented x4,NAD  Eyes: Pupils equal reactive to light and accommodation  Neck: Negative JVD  Cardiovascular: Regular rhythm and rate, +3 systolic murmur heard best left sternal border  Respiratory: Clear to auscultation bilateral  Abdomen: Soft, non-tender, nondistended, plus bowel sounds  Skin: Negative rash  Musculoskeletal: Negative pedal edema bilateral    Labs on Admission:  Basic Metabolic Panel:  Recent Labs Lab 08/29/13 1503  NA 140  K 2.8*  CL 104  CO2 25  GLUCOSE 105*  BUN 20  CREATININE 1.64*  CALCIUM 8.3*   Liver Function Tests:  Recent Labs Lab 08/29/13 1503  AST 18  ALT 29  ALKPHOS 35*  BILITOT 0.4  PROT 5.5*  ALBUMIN 2.5*   No results found for this basename: LIPASE, AMYLASE,  in the last 168 hours No results found for this basename: AMMONIA,  in the last 168 hours CBC:  Recent Labs Lab 08/29/13 1503  WBC 6.3  NEUTROABS 4.5  HGB 11.5*  HCT 32.0*  MCV 86.5  PLT 137*   Cardiac Enzymes: No results found for this basename: CKTOTAL, CKMB, CKMBINDEX, TROPONINI,  in the last 168 hours  BNP (last 3 results) No results found for this basename: PROBNP,  in the last 8760 hours CBG: No results found for this basename: GLUCAP,  in the last 168 hours  Radiological Exams on Admission: Dg Knee 2 Views Left  08/29/2013   CLINICAL DATA:  Knee pain.  EXAM: LEFT KNEE - 1-2 VIEW  COMPARISON:  None  FINDINGS: Mild degenerative spurring along the tibial spines. The No acute bony abnormality. Specifically, no fracture, subluxation, or dislocation. Soft tissues are intact. No joint effusion.  IMPRESSION: No acute bony abnormality.   Electronically Signed   By:  Charlett Nose M.D.   On: 08/29/2013 17:31    EKG: compared EKG 08/16/2013; normal sinus rhythm, left axis deviation, incomplete right bundle branch block. (changes from prior EKG nonspecific ST-T wave change in lead 1, aVL)  Assessment/Plan Active Problems:   * No active hospital problems. *   1. Endocarditis; patient was discharged on vancomycin 1250 mg q. 12 hour and Rocephin 2 g every 12 hours start date 8/29plan for 6 weeks antibiotics per ID (Dr. Johny Sax [phone])   2. Supratherapeutic Vancomycin; unsure if vancomycin level was a peak or trough --pharmacy to manage patient's vancomycin level  3. Hypokalemia; replete  4. Acute renal failure; hydrate aggressively --Hold myocarditis HCT secondary to patient's hydration status/ARF  5. Anemia due to acute blood loss; currently patient is asymptomatic will obtain a.m. labs and if required replete. GI bleed?  6. OSA ; respiratory to provide CPAP  7. Diarrhea/vomiting; C. Difficile  PCR drawn --Obtain blood cultures x2 (most likely will be negative but since patient has been experiencing fevers will obtain)  Dr. Ninetta Lights (infectious disease) aware patient is admitted as he is one who counseled patient to come to ED   Code Status: Full Disposition Plan:   Time spent: 60 minute  Drema Dallas [facility] Pager [phone]  If 7PM-7AM, please contact night-coverage [email or web address] Password [facility] 08/29/2013, 6:48 PM

## 2013-08-29 NOTE — ED Notes (Signed)
Pt requesting PICC line be examined for possible medication administration rather than RN attempting IV. IV team paged.

## 2013-08-29 NOTE — ED Notes (Addendum)
Pt reports he believes his PICC line is infected. States home health RN came by Monday to change dressing. Unable to aspirate blood. States there is increased reddness at site. Denies pain or fever. Pt states he vomited x3 last night and x1 this AM. States nauseated and decreased appetite. States he's on vanc and its making him sicker.Pt reports also is limping from twisting his left knee 5 days ago.

## 2013-08-29 NOTE — Progress Notes (Signed)
ANTIBIOTIC CONSULT NOTE   Pharmacy Consult for vancomycin  Indication: endocarditis   No Known Allergies  Patient Measurements: Height: 5\' 9"  (175.3 cm) Weight: 208 lb 8.9 oz (94.6 kg) IBW/kg (Calculated) : 70.7  Vital Signs: Temp: 100.1 F (37.8 C) (09/10 2036) Temp src: Oral (09/10 2036) BP: 139/61 mmHg (09/10 2036) Pulse Rate: 84 (09/10 2036) Intake/Output from previous day:   Intake/Output from this shift:    Labs:  Recent Labs  08/29/13 1503  WBC 6.3  HGB 11.5*  PLT 137*  CREATININE 1.64*   Estimated Creatinine Clearance: 50.3 ml/min (by C-G formula based on Cr of 1.64).  Recent Labs  08/29/13 1526  VANCORANDOM 22.6     Microbiology: Recent Results (from the past 720 hour(s))  CULTURE, BLOOD (ROUTINE X 2)     Status: None   Collection Time    08/16/13 12:50 PM      Result Value Range Status   Specimen Description BLOOD LEFT ARM   Final   Special Requests BOTTLES DRAWN AEROBIC AND ANAEROBIC 10CC   Final   Culture  Setup Time     Final   Value: 08/16/2013 17:41     Performed at [facility]   Culture     Final   Value: NO GROWTH 5 DAYS     Performed at [facility]   Report Status 08/22/2013 FINAL   Final  CULTURE, BLOOD (ROUTINE X 2)     Status: None   Collection Time    08/16/13 12:55 PM      Result Value Range Status   Specimen Description BLOOD RIGHT ARM   Final   Special Requests BOTTLES DRAWN AEROBIC ONLY 10 ML   Final   Culture  Setup Time     Final   Value: 08/16/2013 17:41     Performed at [facility]   Culture     Final   Value: NO GROWTH 5 DAYS     Performed at [facility]   Report Status 08/22/2013 FINAL   Final  URINE CULTURE     Status: None   Collection Time    08/16/13  1:00 PM      Result Value Range Status   Specimen Description URINE, CLEAN CATCH   Final   Special Requests NONE   Final   Culture  Setup Time     Final   Value: 08/16/2013 17:48     Performed at [facility] Count     Final   Value: NO GROWTH     Performed at [facility]   Culture     Final   Value: NO GROWTH     Performed at [facility]   Report Status 08/17/2013 FINAL   Final  CULTURE, BLOOD (SINGLE)     Status: None   Collection Time    08/16/13  8:40 PM      Result Value Range Status   Specimen Description BLOOD LEFT ARM   Final   Special Requests BOTTLES DRAWN AEROBIC ONLY 3CC   Final   Culture  Setup Time     Final   Value: 08/17/2013 05:28     Performed at [facility]   Culture     Final   Value: NO GROWTH 5 DAYS     Performed at [facility]   Report Status 08/23/2013 FINAL   Final    Medical History: Past Medical History  Diagnosis Date  .  Endocarditis   . High cholesterol   . Hypertension   . Enlarged prostate   . Heart murmur     "think I have one now" (08/16/2013)  . Pneumonia     "almost once/yr" (08/16/2013)  . Sleep apnea     "don't wear mask" (08/16/2013)   Assessment: 67 year old male with history of endocarditis on ceftriaxone/vancomycin as outpatient (seen by Dr.Hatcher in ID clinic). Random vancomycin level this afternoon was above goal likely due to patients acute renal failure with scr 1.6. His most recent dose of two days ago was 1250mg  q12 hours, will decrease dose tonight and plan on rechecking trough Friday at steady state unless renal function significantly worsens.  Goal of Therapy:  Vancomycin trough level 15-20 mcg/ml  Plan:  Measure antibiotic drug levels at steady state-Friday 9/12 Follow up culture results Vancomycin 1g IV q12 hours  Sheppard Coil PharmD., BCPS Clinical Pharmacist Pager [phone] 08/29/2013 9:35 PM

## 2013-08-29 NOTE — ED Provider Notes (Signed)
CSN: [id number]     Arrival date & time 08/29/13  1100 History   First MD Initiated Contact with Patient 08/29/13 1151     Chief Complaint  Patient presents with  . Vascular Access Problem  . Knee Pain   (Consider location/radiation/quality/duration/timing/severity/associated sxs/prior Treatment) HPI Patient was admitted on 08/16/13 for concern for endocarditis. Patient had been experiencing low-grade fevers, chills, sweats for several weeks after having a dental procedure. During his admission Dr. Ninetta Lights of infectious disease was consulted and patient was placed on IV Rocephin and vancomycin for a 6 week course.patient had a echo during his admission that showed possible valvular vegetation. Blood cultures were negative. Since starting the IV antibiotics patient states he has had increased diarrhea, nausea and vomiting. Had blood work performed earlier this week by Dr. Ninetta Lights showed increase in his creatinine and decrease his potassium. Patient also had supratherapeutic levels of vancomycin. The patient's wife called in to Dr. Tyrone Apple office and informed him that he had been vomiting last night several times. Patient was instructed to come to the emergency department  Patient admits to possible blood in his stool last night. No melena. No vomiting blood. Patient also complains of left knee pain for 5 days after twisting his knee. He has an appointment to follow up with an orthopedist. There is no swelling or warmth. Pain is worse with range of motion of the left knee. Past Medical History  Diagnosis Date  . Endocarditis   . High cholesterol   . Hypertension   . Enlarged prostate   . Heart murmur     "think I have one now" (08/16/2013)  . Pneumonia     "almost once/yr" (08/16/2013)  . Sleep apnea     "don't wear mask" (08/16/2013)   Past Surgical History  Procedure Laterality Date  . Tonsillectomy  ~ 1957  . Inguinal hernia repair Right ~ 1950  . Umbilical hernia repair  ~ 2011  . Tee  without cardioversion N/A 08/17/2013    Procedure: TRANSESOPHAGEAL ECHOCARDIOGRAM (TEE);  Surgeon: Donato Schultz, MD;  Location: [facility] ENDOSCOPY;  Service: Cardiovascular;  Laterality: N/A;   Family History  Problem Relation Age of Onset  . Hypertension Mother   . Heart attack Father 33   History  Substance Use Topics  . Smoking status: Never Smoker   . Smokeless tobacco: Never Used  . Alcohol Use: 1.2 oz/week    2 Glasses of wine per week    Review of Systems  Constitutional: Positive for fatigue. Negative for fever and chills.  HENT: Negative for neck pain and neck stiffness.   Respiratory: Negative for cough and shortness of breath.   Cardiovascular: Negative for chest pain.  Gastrointestinal: Positive for nausea, vomiting and diarrhea. Negative for abdominal pain.  Genitourinary: Negative for dysuria, frequency and flank pain.  Musculoskeletal: Positive for arthralgias. Negative for back pain.  Skin: Negative for pallor, rash and wound.  Neurological: Positive for weakness (generalized). Negative for dizziness, light-headedness, numbness and headaches.  All other systems reviewed and are negative.    Allergies  Review of patient's allergies indicates no known allergies.  Home Medications   Current Outpatient Rx  Name  Route  Sig  Dispense  Refill  . atorvastatin (LIPITOR) 20 MG tablet   Oral   Take 20 mg by mouth daily.         Marland Kitchen dextrose 5 % SOLN 50 mL with cefTRIAXone 2 G SOLR 2 g   Intravenous   Inject 2 g into  the vein every 12 (twelve) hours. X 6 weeks   30 vial   5   . ibuprofen (ADVIL,MOTRIN) 200 MG tablet   Oral   Take 400 mg by mouth every 6 (six) hours as needed for pain.         . sodium chloride 0.9 % injection   Intracatheter   10-40 mLs by Intracatheter route as needed (flush).   10 mL   30   . sodium chloride 0.9 % SOLN 250 mL with vancomycin 10 G SOLR 1,250 mg   Intravenous   Inject 1,250 mg into the vein every 12 (twelve) hours. X 6  weeks   30 vial   5   . telmisartan-hydrochlorothiazide (MICARDIS HCT) 40-12.5 MG per tablet   Oral   Take 1 tablet by mouth daily.          BP 140/48  Pulse 87  Temp(Src) 98.6 F (37 C) (Oral)  Resp 18  Ht 5\' 9"  (1.753 m)  Wt 208 lb 12.8 oz (94.711 kg)  BMI 30.82 kg/m2  SpO2 98% Physical Exam  Nursing note and vitals reviewed. Constitutional: He is oriented to person, place, and time. He appears well-developed and well-nourished. No distress.  HENT:  Head: Normocephalic and atraumatic.  Mouth/Throat: Oropharynx is clear and moist.  Eyes: EOM are normal. Pupils are equal, round, and reactive to light.  Neck: Normal range of motion. Neck supple.  No nuchal rigidity  Cardiovascular: Normal rate and regular rhythm.   Pulmonary/Chest: Effort normal and breath sounds normal. No respiratory distress. He has no wheezes. He has no rales. He exhibits no tenderness.  Abdominal: Soft. Bowel sounds are normal. He exhibits no distension and no mass. There is no tenderness. There is no rebound and no guarding.  Musculoskeletal: Normal range of motion. He exhibits no edema and no tenderness.  Left knee with limited range of motion due to pain. No crepitance or tenderness to palpation. There is no ligamentous instability. No apparent effusions. No warmth or redness. Distal pulses intact.  Neurological: He is alert and oriented to person, place, and time.  Differential focal weakness or numbness  Skin: Skin is warm and dry. No rash noted. No erythema.  PICC line in the right AC. Very mild erythema at the insertion site. There is no induration and no tenderness at the site.  Psychiatric: He has a normal mood and affect. His behavior is normal.    ED Course  Procedures (including critical care time) Labs Review Labs Reviewed  CLOSTRIDIUM DIFFICILE BY PCR  CBC WITH DIFFERENTIAL  COMPREHENSIVE METABOLIC PANEL  VANCOMYCIN, RANDOM   Imaging Review No results found.  MDM   Discussed  with Dr. Ninetta Lights. States the patient needs to continue on his antibiotics for suspected endocarditis. Asked to have C. Difficile sent and to give IV fluids.  Patient was quite positive stool. The C. difficile pending. Patient no further vomiting in the emergency department. Vital signs remained stable. Hemoglobin noted to drop from 14 to 11 since discharge. Discussed with [facility] hospitalist. We'll admit patient for dehydration, hypokalemia, gastrointestinal bleeding   Loren Racer, MD 08/29/13 1901

## 2013-08-29 NOTE — ED Notes (Signed)
Heart healthy diet order for pt.

## 2013-08-30 DIAGNOSIS — N179 Acute kidney failure, unspecified: Secondary | ICD-10-CM

## 2013-08-30 DIAGNOSIS — I330 Acute and subacute infective endocarditis: Secondary | ICD-10-CM

## 2013-08-30 LAB — CBC WITH DIFFERENTIAL/PLATELET
Basophils Relative: 1 % (ref 0–1)
Eosinophils Relative: 2 % (ref 0–5)
Hemoglobin: 11.1 g/dL — ABNORMAL LOW (ref 13.0–17.0)
Monocytes Relative: 15 % — ABNORMAL HIGH (ref 3–12)
Neutrophils Relative %: 67 % (ref 43–77)
Platelets: 136 10*3/uL — ABNORMAL LOW (ref 150–400)
RBC: 3.49 MIL/uL — ABNORMAL LOW (ref 4.22–5.81)
WBC: 6.2 10*3/uL (ref 4.0–10.5)

## 2013-08-30 LAB — COMPREHENSIVE METABOLIC PANEL
AST: 16 U/L (ref 0–37)
CO2: 25 mEq/L (ref 19–32)
Calcium: 8 mg/dL — ABNORMAL LOW (ref 8.4–10.5)
Creatinine, Ser: 1.69 mg/dL — ABNORMAL HIGH (ref 0.50–1.35)
GFR calc Af Amer: 47 mL/min — ABNORMAL LOW (ref >90)
GFR calc non Af Amer: 41 mL/min — ABNORMAL LOW (ref >90)
Sodium: 140 mEq/L (ref 135–145)
Total Protein: 5.2 g/dL — ABNORMAL LOW (ref 6.0–8.3)

## 2013-08-30 LAB — OCCULT BLOOD X 1 CARD TO LAB, STOOL: Fecal Occult Bld: NEGATIVE

## 2013-08-30 LAB — CLOSTRIDIUM DIFFICILE BY PCR: Toxigenic C. Difficile by PCR: NEGATIVE

## 2013-08-30 MED ORDER — POTASSIUM CHLORIDE 10 MEQ/100ML IV SOLN
10.0000 meq | INTRAVENOUS | Status: AC
  Administered 2013-08-30 (×3): 10 meq via INTRAVENOUS
  Filled 2013-08-30 (×3): qty 100

## 2013-08-30 MED ORDER — LOPERAMIDE HCL 2 MG PO CAPS
2.0000 mg | ORAL_CAPSULE | ORAL | Status: DC | PRN
  Administered 2013-08-30 – 2013-08-31 (×2): 2 mg via ORAL
  Filled 2013-08-30 (×3): qty 1

## 2013-08-30 MED ORDER — MENTHOL 3 MG MT LOZG
1.0000 | LOZENGE | OROMUCOSAL | Status: DC | PRN
  Filled 2013-08-30 (×2): qty 9

## 2013-08-30 MED ORDER — MAGNESIUM SULFATE 40 MG/ML IJ SOLN
2.0000 g | Freq: Once | INTRAMUSCULAR | Status: AC
  Administered 2013-08-30: 12:00:00 2 g via INTRAVENOUS
  Filled 2013-08-30: qty 50

## 2013-08-30 MED ORDER — BOOST / RESOURCE BREEZE PO LIQD
1.0000 | Freq: Three times a day (TID) | ORAL | Status: DC
  Administered 2013-08-30 – 2013-09-03 (×11): 1 via ORAL

## 2013-08-30 MED ORDER — ONDANSETRON HCL 4 MG/2ML IJ SOLN
4.0000 mg | Freq: Three times a day (TID) | INTRAMUSCULAR | Status: DC | PRN
  Administered 2013-08-30: 10:00:00 4 mg via INTRAVENOUS
  Filled 2013-08-30 (×2): qty 2

## 2013-08-30 MED ORDER — SACCHAROMYCES BOULARDII 250 MG PO CAPS
250.0000 mg | ORAL_CAPSULE | Freq: Two times a day (BID) | ORAL | Status: DC
  Administered 2013-08-30 – 2013-09-03 (×9): 250 mg via ORAL
  Filled 2013-08-30 (×11): qty 1

## 2013-08-30 MED ORDER — SODIUM CHLORIDE 0.9 % IV SOLN
INTRAVENOUS | Status: DC
  Administered 2013-08-30 – 2013-09-02 (×8): via INTRAVENOUS
  Filled 2013-08-30 (×11): qty 1000

## 2013-08-30 MED ORDER — BARRIER CREAM NON-SPECIFIED
1.0000 "application " | TOPICAL_CREAM | Freq: Two times a day (BID) | TOPICAL | Status: DC | PRN
  Filled 2013-08-30: qty 1

## 2013-08-30 NOTE — Progress Notes (Signed)
INFECTIOUS DISEASE PROGRESS NOTE  ID: Eric Chambers is a 67 y.o. 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 He had BCx (-) x 3, and negative RA, chlamydia serologies. He had TEE showing:Tricuspid valve: There is echogenic strandlike object in the right ventricle associated with the lateral aspect of the tricuspid valve chaotic in motion which is concerning for possible vegetation. He was d/c home with vanco/ceftriaxone for presumed IE.  He returns 9-10 with n/v and found in ED to have ARF (Cr 1.64). He also developed blood in stool. Since admission pt has refused IV vanco.   Review of Systems  Constitutional: Negative for fever and chills.  Respiratory: Negative for shortness of breath.   Cardiovascular: Negative for chest pain.  Gastrointestinal: Positive for nausea, vomiting, diarrhea and blood in stool.  Skin: Negative for rash.  Neurological: Negative for dizziness.     Abtx:  Anti-infectives   Start     Dose/Rate Route Frequency Ordered Stop   08/29/13 2200  vancomycin (VANCOCIN) IVPB 1000 mg/200 mL premix     1,000 mg 200 mL/hr over 60 Minutes Intravenous Every 12 hours 08/29/13 2129     08/29/13 2130  cefTRIAXone (ROCEPHIN) 2 g in dextrose 5 % 50 mL IVPB     2 g 100 mL/hr over 30 Minutes Intravenous Every 12 hours 08/29/13 2124     08/29/13 2130  vancomycin (VANCOCIN) 1,250 mg in sodium chloride 0.9 % 250 mL IVPB  Status:  Discontinued     1,250 mg 166.7 mL/hr over 90 Minutes Intravenous Every 12 hours 08/29/13 2124 08/29/13 2127      Medications:  Scheduled: . aspirin EC  81 mg Oral Daily  . atorvastatin  20 mg Oral Daily  . cefTRIAXone (ROCEPHIN) IVPB 2 gram/50 mL D5W (Pyxis)  2 g  Intravenous Q12H  . feeding supplement  1 Container Oral TID BM  . saccharomyces boulardii  250 mg Oral BID  . vancomycin  1,000 mg Intravenous Q12H    Objective: Vital signs in last 24 hours: Temp:  [99.7 F (37.6 C)-100.1 F (37.8 C)] 99.7 F (37.6 C) (09/11 0509) Pulse Rate:  [74-84] 78 (09/11 0509) Resp:  [18-21] 18 (09/11 0509) BP: (119-139)/(48-70) 119/48 mmHg (09/11 0509) SpO2:  [96 %-98 %] 96 % (09/11 0509) Weight:  [94.6 kg (208 lb 8.9 oz)] 94.6 kg (208 lb 8.9 oz) (09/10 2036)   General appearance: alert, cooperative and mild distress Eyes: negative findings: pupils equal, round, reactive to light and accomodation Throat: normal findings: oropharynx pink & moist without lesions or evidence of thrush Resp: clear to auscultation bilaterally Cardio: regular rate and rhythm GI: normal findings: bowel sounds normal and soft, non-tender Extremities: RUE PIC is clean, non-tender, no d/c, no cordis.   Lab Results  Recent Labs  08/29/13 1503 08/30/13 0640  WBC 6.3 6.2  HGB 11.5* 11.1*  HCT 32.0* 30.4*  NA 140 140  K 2.8* 2.9*  CL 104 106  CO2 25 25  BUN 20 17  CREATININE 1.64* 1.69*   Liver Panel  Recent Labs  08/29/13 1503 08/30/13 0640  PROT 5.5* 5.2*  ALBUMIN 2.5* 2.3*  AST 18 16  ALT  29 26  ALKPHOS 35* 32*  BILITOT 0.4 0.3   Sedimentation Rate No results found for this basename: ESRSEDRATE,  in the last 72 hours C-Reactive Protein No results found for this basename: CRP,  in the last 72 hours  Microbiology: Recent Results (from the past 240 hour(s))  CLOSTRIDIUM DIFFICILE BY PCR     Status: None   Collection Time    08/29/13  5:47 PM      Result Value Range Status   C difficile by pcr NEGATIVE  NEGATIVE Final    Studies/Results: Dg Knee 2 Views Left  08/29/2013   CLINICAL DATA:  Knee pain.  EXAM: LEFT KNEE - 1-2 VIEW  COMPARISON:  None  FINDINGS: Mild degenerative spurring along the tibial spines. The No acute bony abnormality. Specifically,  no fracture, subluxation, or dislocation. Soft tissues are intact. No joint effusion.  IMPRESSION: No acute bony abnormality.   Electronically Signed   By: Charlett Nose M.D.   On: 08/29/2013 17:31     Assessment/Plan: Presumed IE  TEE- 08-17-13- Tricuspid valve: There is echogenic strandlike object in  the right ventricle associated with the lateral aspect of  the tricuspid valve chaotic in motion which is concerning  for possible vegetation.  BCx (-)  Chlamydia serologies (-) ARF (Cr 1.09 --> 1.69) HypoKalemia dehydration Hx of Ao IE (? Organism) 2009  Total days of antibiotics: 14 (vanco/ceftriaxone)  Will stop vanco Consider alternate GPC coverage as his Cr improves Fortunately C diff is (-).  BCx repeated Will ask IV team to assess his PIC as he reports that it has not been drawing blood or flushing well. Will continue to follow.          Johny Sax Infectious Diseases (pager) [phone] [email or web address] 08/30/2013, 3:14 PM  LOS: 1 day

## 2013-08-30 NOTE — Progress Notes (Addendum)
[facility] PROGRESS NOTE  Reinhart Saulters GNF:[id number] DOB: 10/04/1946 DOA: 08/29/2013 PCP: [name], MD  Assessment/Plan:  Acute Renal Failure  Likely related to diarrhea with volume depletion,?supratheraputic vancomycin level contributing as well.  C/w IV Fluids  Pharmacy to dose Vancomycin-however patient wants this stopped or changed-will defer this to the ID service  Holding nephrotoxic medications (ARB/HCTZ)  Diarrhea  10x daily  Secondary to antibiotic treatment vs infection  C. difficile colitis infection negative with culture PCR, await Stool GI panel by PCR, c/s, and ova/parasites  Continue IV fluids to minimize dehydration, start Florastor and prn Imodium  Hypokalemia  Replete IV and Oral  Recheck BMP  Mag 1.5 will give Chambers dose.  Recent diagnosis of Endocarditis -cultures however negative -await ID eval-re antibiotics-will defer to ID  Anemia  Guiac positive, yellow stools  Hgb dropped 3 grams in 3 weeks.  GI eval can be done in the outpatient setting  OSA  Does not wear CPAP  HTN  Holding Micadis.  BP stable WNL   Code Status: FULL Family Communication: none in room Disposition Plan: Inpatient.  Home with home health services when appropriate.   Antibiotics:  Vancomycin, started 08/29/13  Ceftriaxone, started 08/29/13  HPI/Subjective: Pt continues to report nausea and loose, watery stools. He had 4 BMs this am.  Concerned that he is still on Vancomycin.  Objective: Filed Vitals:   08/30/13 0509  BP: 119/48  Pulse: 78  Temp: 99.7 F (37.6 C)  Resp: 18    Intake/Output Summary (Last 24 hours) at 08/30/13 1052 Last data filed at 08/30/13 0900  Gross per 24 hour  Intake 1467.08 ml  Output    725 ml  Net 742.08 ml   Filed Weights   08/29/13 1106 08/29/13 2036  Weight: 94.711 kg (208 lb 12.8 oz) 94.6 kg (208 lb 8.9 oz)    Exam:   General:  Well-developed, well-nourished male, slightly  agitated  Cardiovascular: RRR, murmur heard, no rub or gallop  Respiratory: CTA bilaterally without rhonchi, rales, or wheezing  Abdomen: soft, non-distended, non-tender in all four quadrants  Musculoskeletal: no edema, normal ROM   Data Reviewed: Basic Metabolic Panel:  Recent Labs Lab 08/29/13 1503 08/30/13 0640  NA 140 140  K 2.8* 2.9*  CL 104 106  CO2 25 25  GLUCOSE 105* 105*  BUN 20 17  CREATININE 1.64* 1.69*  CALCIUM 8.3* 8.0*  MG  --  1.5   Liver Function Tests:  Recent Labs Lab 08/29/13 1503 08/30/13 0640  AST 18 16  ALT 29 26  ALKPHOS 35* 32*  BILITOT 0.4 0.3  PROT 5.5* 5.2*  ALBUMIN 2.5* 2.3*   CBC:  Recent Labs Lab 08/29/13 1503 08/30/13 0640  WBC 6.3 6.2  NEUTROABS 4.5 4.2  HGB 11.5* 11.1*  HCT 32.0* 30.4*  MCV 86.5 87.1  PLT 137* 136*     Recent Results (from the past 240 hour(s))  CLOSTRIDIUM DIFFICILE BY PCR     Status: None   Collection Time    08/29/13  5:47 PM      Result Value Range Status   C difficile by pcr NEGATIVE  NEGATIVE Final     Studies: Dg Knee 2 Views Left  08/29/2013   CLINICAL DATA:  Knee pain.  EXAM: LEFT KNEE - 1-2 VIEW  COMPARISON:  None  FINDINGS: Mild degenerative spurring along the tibial spines. The No acute bony abnormality. Specifically, no fracture, subluxation, or dislocation. Soft tissues are intact. No joint effusion.  IMPRESSION:  No acute bony abnormality.   Electronically Signed   By: Charlett Nose M.D.   On: 08/29/2013 17:31    Scheduled Meds: . aspirin EC  81 mg Oral Daily  . atorvastatin  20 mg Oral Daily  . cefTRIAXone (ROCEPHIN) IVPB 2 gram/50 mL D5W (Pyxis)  2 g Intravenous Q12H  . potassium chloride  10 mEq Intravenous Q1 Hr x 3  . saccharomyces boulardii  250 mg Oral BID  . vancomycin  1,000 mg Intravenous Q12H   Continuous Infusions: . sodium chloride 100 mL/hr at 08/29/13 2307    Active Problems:   Hypertension   Endocarditis   OSA (obstructive sleep apnea)   Hypokalemia    Anemia due to acute blood loss   Diarrhea   Vomiting    Eric Chambers, Eric A PA-S  Algis Downs, PA-C [facility] 08/30/2013, 10:52 AM  LOS: 1 day    Attending Patient seen and examined, agree with the above assessment and plan. Await ID eval-regarding antibiotics Regarding Diarrhea-C Diff PCR neg,start trial of Imodium and Probiotics.   S Glen Kesinger MD

## 2013-08-30 NOTE — Progress Notes (Signed)
Utilization review completed. Eriko Economos, RN, BSN. 

## 2013-08-30 NOTE — Progress Notes (Signed)
INITIAL NUTRITION ASSESSMENT  DOCUMENTATION CODES Per approved criteria  -Obesity Unspecified   INTERVENTION:  Resource Breeze 3 times daily (250 kcals, 9 gm protein per 8 fl oz carton) RD to follow for nutrition care plan  NUTRITION DIAGNOSIS: Inadequate oral intake related to poor appetite as evidenced by patient report  Goal: Pt to meet >/= 90% of their estimated nutrition needs   Monitor:  PO & supplemental intake, weight, labs, I/O's  Reason for Assessment: Consult, Malnutrition Screening Tool Report  67 y.o. male  Admitting Dx: endocarditis   ASSESSMENT: Patient with PMH of PNA, HTN, hypercholesteremia; recently admitted on 08/16/13 for concern for endocarditis; sent home on IV antibiotics and started having increased diarrhea, nausea and vomiting; instructed to come to ED.  RD spoke with patient's wife at bedside; reports patient has had a poor appetite for the past week; states his IV ABX has been "making him sick;" no recent weight loss; patient at nutrition risk and would benefit from addition of nutrition supplements; amenable to [employer] ---> RD to order.  Height: Ht Readings from Last 1 Encounters:  08/29/13 5\' 9"  (1.753 m)    Weight: Wt Readings from Last 1 Encounters:  08/29/13 208 lb 8.9 oz (94.6 kg)    Ideal Body Weight: 160 lb  % Ideal Body Weight: 130%  Wt Readings from Last 10 Encounters:  08/29/13 208 lb 8.9 oz (94.6 kg)  08/19/13 192 lb 10.9 oz (87.4 kg)  08/19/13 192 lb 10.9 oz (87.4 kg)    Usual Body Weight: 208 lb (per patient)  % Usual Body Weight: 100%  BMI:  Body mass index is 30.78 kg/(m^2).  Estimated Nutritional Needs: Kcal: 2100-2300 Protein: 110-120 gm Fluid: 2.1-2.3 L  Skin: Intact  Diet Order: Cardiac  EDUCATION NEEDS: -No education needs identified at this time   Intake/Output Summary (Last 24 hours) at 08/30/13 1202 Last data filed at 08/30/13 0900  Gross per 24 hour  Intake 1467.08 ml  Output    725  ml  Net 742.08 ml    Labs:   Recent Labs Lab 08/29/13 1503 08/30/13 0640  NA 140 140  K 2.8* 2.9*  CL 104 106  CO2 25 25  BUN 20 17  CREATININE 1.64* 1.69*  CALCIUM 8.3* 8.0*  MG  --  1.5  GLUCOSE 105* 105*    Scheduled Meds: . aspirin EC  81 mg Oral Daily  . atorvastatin  20 mg Oral Daily  . cefTRIAXone (ROCEPHIN) IVPB 2 gram/50 mL D5W (Pyxis)  2 g Intravenous Q12H  . magnesium sulfate 1 - 4 g bolus IVPB  2 g Intravenous Once  . potassium chloride  10 mEq Intravenous Q1 Hr x 3  . saccharomyces boulardii  250 mg Oral BID  . vancomycin  1,000 mg Intravenous Q12H    Continuous Infusions: . sodium chloride 0.9 % 1,000 mL with potassium chloride 40 mEq infusion      Past Medical History  Diagnosis Date  . Endocarditis   . High cholesterol   . Hypertension   . Enlarged prostate   . Heart murmur     "think I have one now" (08/16/2013)  . Pneumonia     "almost once/yr" (08/16/2013)  . Sleep apnea     "don't wear mask" (08/16/2013)    Past Surgical History  Procedure Laterality Date  . Tonsillectomy  ~ 1957  . Inguinal hernia repair Right ~ 1950  . Umbilical hernia repair  ~ 2011  . Tee without cardioversion  N/A 08/17/2013    Procedure: TRANSESOPHAGEAL ECHOCARDIOGRAM (TEE);  Surgeon: Donato Schultz, MD;  Location[facility] ENDOSCOPY;  Service: Cardiovascular;  Laterality: N/A;    Maureen Chatters, RD, LDN Pager #: [phone] After-Hours Pager #: [phone]

## 2013-08-30 NOTE — Progress Notes (Signed)
Pt is refusing to wear CPAP. States that he does fine without it. RT made pt aware that if he changed his mind to please call.

## 2013-08-30 NOTE — Progress Notes (Signed)
PHARMACY NOTE  Pharmacy Consult for :  Vancomycin Indication:  Endocarditis  Hospital Problems Active Problems:   Hypertension   Endocarditis   OSA (obstructive sleep apnea)   Hypokalemia   Anemia due to acute blood loss   Diarrhea   Vomiting   Weight: 94.6 kg  Vitals: BP 119/48  Pulse 78  Temp(Src) 99.7 F (37.6 C) (Oral)  Resp 18  Ht 5\' 9"  (1.753 m)  Wt 208 lb 8.9 oz (94.6 kg)  BMI 30.78 kg/m2  SpO2 96%  Labs:  Recent Labs  08/29/13 1503 08/30/13 0640  WBC 6.3 6.2  HGB 11.5* 11.1*  PLT 137* 136*  CREATININE 1.64* 1.69*   Estimated Creatinine Clearance: 48.8 ml/min (by C-G formula based on Cr of 1.69).   Microbiology: Recent Results (from the past 720 hour(s))  CULTURE, BLOOD (ROUTINE X 2)     Status: None   Collection Time    08/16/13 12:50 PM      Result Value Range Status   Specimen Description BLOOD LEFT ARM   Final   Special Requests BOTTLES DRAWN AEROBIC AND ANAEROBIC 10CC   Final   Culture  Setup Time     Final   Value: 08/16/2013 17:41     Performed at [facility]   Culture     Final   Value: NO GROWTH 5 DAYS     Performed at [facility]   Report Status 08/22/2013 FINAL   Final  CULTURE, BLOOD (ROUTINE X 2)     Status: None   Collection Time    08/16/13 12:55 PM      Result Value Range Status   Specimen Description BLOOD RIGHT ARM   Final   Special Requests BOTTLES DRAWN AEROBIC ONLY 10 ML   Final   Culture  Setup Time     Final   Value: 08/16/2013 17:41     Performed at [facility]   Culture     Final   Value: NO GROWTH 5 DAYS     Performed at [facility]   Report Status 08/22/2013 FINAL   Final  URINE CULTURE     Status: None   Collection Time    08/16/13  1:00 PM      Result Value Range Status   Specimen Description URINE, CLEAN CATCH   Final   Special Requests NONE   Final   Culture  Setup Time     Final   Value: 08/16/2013 17:48     Performed at [facility]  Count     Final   Value: NO GROWTH     Performed at [facility]   Culture     Final   Value: NO GROWTH     Performed at [facility]   Report Status 08/17/2013 FINAL   Final  CULTURE, BLOOD (SINGLE)     Status: None   Collection Time    08/16/13  8:40 PM      Result Value Range Status   Specimen Description BLOOD LEFT ARM   Final   Special Requests BOTTLES DRAWN AEROBIC ONLY 3CC   Final   Culture  Setup Time     Final   Value: 08/17/2013 05:28     Performed at [facility]   Culture     Final   Value: NO GROWTH 5 DAYS     Performed at [facility]   Report Status 08/23/2013 FINAL  Final  CLOSTRIDIUM DIFFICILE BY PCR     Status: None   Collection Time    08/29/13  5:47 PM      Result Value Range Status   C difficile by pcr NEGATIVE  NEGATIVE Final    Anti-infectives Anti-infectives   Start     Dose/Rate Route Frequency Ordered Stop   08/29/13 2200  vancomycin (VANCOCIN) IVPB 1000 mg/200 mL premix     1,000 mg 200 mL/hr over 60 Minutes Intravenous Every 12 hours 08/29/13 2129     08/29/13 2130  cefTRIAXone (ROCEPHIN) 2 g in dextrose 5 % 50 mL IVPB     2 g 100 mL/hr over 30 Minutes Intravenous Every 12 hours 08/29/13 2124     08/29/13 2130  vancomycin (VANCOCIN) 1,250 mg in sodium chloride 0.9 % 250 mL IVPB  Status:  Discontinued     1,250 mg 166.7 mL/hr over 90 Minutes Intravenous Every 12 hours 08/29/13 2124 08/29/13 2127      Assessment:  67 year old male with history of endocarditis on ceftriaxone/vancomycin as outpatient (seen by Dr.Hatcher in ID clinic).  Patient admitted with acute renal failure with current Scr 1.69 [baseline at start of Vancomycin therapy 1.19].  Last Vancomycin level 22.6 mcg/ml and last PM Vancomycin dose reduced.  He received 1 dose of Vancomycin 1 gm at ~ 1 Am.  Now patient is reportedly refusing further Vancomycin.  ID has been informed of this patient's admission and will be asked to clarify antibiotic  therapy/selection.  Goal of Therapy:   If patient is to continue current antibiotics, then goal is Vancomycin trough levels 15-20 mcg/ml Antibiotics adjusted for renal function as required.  Plan:   Follow ID recommendations for antibiotic selection given patient's refusal for further Vancomycin.  Laurena Bering, Pharm.D.  08/30/2013 10:59 AM

## 2013-08-31 LAB — CBC WITH DIFFERENTIAL/PLATELET
Basophils Absolute: 0 10*3/uL (ref 0.0–0.1)
Basophils Relative: 0 % (ref 0–1)
Eosinophils Absolute: 0.1 10*3/uL (ref 0.0–0.7)
Hemoglobin: 11.2 g/dL — ABNORMAL LOW (ref 13.0–17.0)
MCH: 31.8 pg (ref 26.0–34.0)
MCHC: 36.4 g/dL — ABNORMAL HIGH (ref 30.0–36.0)
Neutro Abs: 5.6 10*3/uL (ref 1.7–7.7)
Neutrophils Relative %: 74 % (ref 43–77)
Platelets: 159 10*3/uL (ref 150–400)

## 2013-08-31 LAB — GI PATHOGEN PANEL BY PCR, STOOL
Campylobacter by PCR: NEGATIVE
Cryptosporidium by PCR: NEGATIVE
E coli (ETEC) LT/ST: NEGATIVE
E coli (STEC): NEGATIVE
Norovirus GI/GII: NEGATIVE
Rotavirus A by PCR: NEGATIVE
Shigella by PCR: NEGATIVE

## 2013-08-31 LAB — BASIC METABOLIC PANEL
Chloride: 107 mEq/L (ref 96–112)
GFR calc Af Amer: 44 mL/min — ABNORMAL LOW (ref >90)
GFR calc non Af Amer: 38 mL/min — ABNORMAL LOW (ref >90)
Potassium: 3.3 mEq/L — ABNORMAL LOW (ref 3.5–5.1)
Sodium: 139 mEq/L (ref 135–145)

## 2013-08-31 LAB — OVA AND PARASITE EXAMINATION

## 2013-08-31 MED ORDER — LOPERAMIDE HCL 2 MG PO CAPS
2.0000 mg | ORAL_CAPSULE | ORAL | Status: DC | PRN
  Administered 2013-08-31 – 2013-09-03 (×2): 2 mg via ORAL
  Filled 2013-08-31: qty 1

## 2013-08-31 MED ORDER — MUSCLE RUB 10-15 % EX CREA
TOPICAL_CREAM | CUTANEOUS | Status: DC | PRN
  Administered 2013-08-31: 11:00:00 via TOPICAL
  Administered 2013-08-31: 1 via TOPICAL
  Administered 2013-08-31: 21:00:00 via TOPICAL
  Filled 2013-08-31: qty 85

## 2013-08-31 MED ORDER — POTASSIUM CHLORIDE CRYS ER 20 MEQ PO TBCR
40.0000 meq | EXTENDED_RELEASE_TABLET | Freq: Once | ORAL | Status: AC
  Administered 2013-08-31: 09:00:00 40 meq via ORAL
  Filled 2013-08-31: qty 2

## 2013-08-31 NOTE — Progress Notes (Signed)
[facility]  Patient Status:   Active pt with [facility] at time of this readmission to [facility].  [facility] is providing the following services:   Pt  Is active with [facility] RN and [facility] for home IV Abx.  If patient discharges after hours, please call [phone].   Sedalia Muta 08/31/2013, 5:23 PM

## 2013-08-31 NOTE — Progress Notes (Addendum)
[facility] PROGRESS NOTE  Eric Chambers ZOX:[id number] DOB: [birth year] DOA: 08/29/2013 PCP: [name], MD  Assessment/Plan:   Acute Kidney Failure  Creatinine rose slightly 9/12.  Diarrhea with volume depletion vs supratherapeutic vancomycin level  C/w IV fluids  D/c Vancomycin, continue Rocephin.    Holding nephrotoxic medications (ARB/HCTZ)  Will restart Abx once creatinine comes down, ID to decide treatment  Recent Endocarditis  Tricuspid valve with probable infective endocarditis seen on TEE (bld cx negative)  Was treated with Vanc / Rocephin  Now vanc being held at patient's insistence  Appreciate ID management of antibiotics  Of note, patient had a h/o endocarditis in 2009  Diarrhea  Watery stools q2hrs   Secondary to antibiotic treatment vs infection  C. difficile PCR negative, awaiting Stool GI panel results  C/w IV fluids, Florastor, prn Imodium   Hypokalemia  Stablizing on most recent BMP  Continue to replete in IVF given continued diarrhea.  Recheck BMP  Hypertension  Holding Micardis  BP remains stable  OSA  Does not wear CPAP  Left Knee pain  Xray negative for bony abnormality or fracture  PT consultation requested  Crista Elliot, Supportive care.  Oxycodone PRN  Code Status: FULL Family Communication:  Disposition Plan: Inpatient, home with home health services when appropriate.   Consultants:  ID   Antibiotics:  Vancomycin: started 08/29/13, stopped 08/30/13  Ceftriaxone: started 08/29/13  HPI/Subjective: Patient denies nausea and vomiting since discontinuation of Vancomycin. He continues to have diarrhea q2hr which is slowly improving along with his appetite. He desires to return home and requests PT for his left knee.   Objective: Filed Vitals:   08/31/13 0939  BP: 134/68  Pulse: 82  Temp: 98.2 F (36.8 C)  Resp:     Intake/Output Summary (Last 24 hours) at 08/31/13 1127 Last data filed at  08/31/13 1005  Gross per 24 hour  Intake    170 ml  Output    950 ml  Net   -780 ml   Filed Weights   08/29/13 1106 08/29/13 2036 08/31/13 0700  Weight: 94.711 kg (208 lb 12.8 oz) 94.6 kg (208 lb 8.9 oz) 96.3 kg (212 lb 4.9 oz)    Exam:   General:  Well-developed, well-nourished male  Cardiovascular: RRR, murmur heard, no rub or gallop  Respiratory: CTA bilaterally, no rhonchi, rales or wheezes  Abdomen: soft, non-tender and non-distended  Musculoskeletal: no edema bilaterally, limited ROM left knee  Data Reviewed: Basic Metabolic Panel:  Recent Labs Lab 08/29/13 1503 08/30/13 0640 08/31/13 0525  NA 140 140 139  K 2.8* 2.9* 3.3*  CL 104 106 107  CO2 25 25 23   GLUCOSE 105* 105* 129*  BUN 20 17 15   CREATININE 1.64* 1.69* 1.80*  CALCIUM 8.3* 8.0* 7.8*  MG  --  1.5  --    Liver Function Tests:  Recent Labs Lab 08/29/13 1503 08/30/13 0640  AST 18 16  ALT 29 26  ALKPHOS 35* 32*  BILITOT 0.4 0.3  PROT 5.5* 5.2*  ALBUMIN 2.5* 2.3*   CBC:  Recent Labs Lab 08/29/13 1503 08/30/13 0640 08/31/13 0525  WBC 6.3 6.2 7.6  NEUTROABS 4.5 4.2 5.6  HGB 11.5* 11.1* 11.2*  HCT 32.0* 30.4* 30.8*  MCV 86.5 87.1 87.5  PLT 137* 136* 159    Recent Results (from the past 240 hour(s))  CLOSTRIDIUM DIFFICILE BY PCR     Status: None   Collection Time    08/29/13  5:47 PM  Result Value Range Status   C difficile by pcr NEGATIVE  NEGATIVE Final  CULTURE, BLOOD (ROUTINE X 2)     Status: None   Collection Time    08/29/13 10:12 PM      Result Value Range Status   Specimen Description BLOOD RIGHT ARM   Final   Special Requests BOTTLES DRAWN AEROBIC ONLY 5CC   Final   Culture  Setup Time     Final   Value: 08/30/2013 05:12     Performed at [facility]   Culture     Final   Value:        BLOOD CULTURE RECEIVED NO GROWTH TO DATE CULTURE WILL BE HELD FOR 5 DAYS BEFORE ISSUING A FINAL NEGATIVE REPORT     Performed at [facility]   Report Status  PENDING   Incomplete  CULTURE, BLOOD (ROUTINE X 2)     Status: None   Collection Time    08/29/13 10:32 PM      Result Value Range Status   Specimen Description BLOOD LEFT HAND   Final   Special Requests BOTTLES DRAWN AEROBIC ONLY 5CC   Final   Culture  Setup Time     Final   Value: 08/30/2013 05:13     Performed at [facility]   Culture     Final   Value:        BLOOD CULTURE RECEIVED NO GROWTH TO DATE CULTURE WILL BE HELD FOR 5 DAYS BEFORE ISSUING A FINAL NEGATIVE REPORT     Performed at [facility]   Report Status PENDING   Incomplete  STOOL CULTURE     Status: None   Collection Time    08/30/13 11:12 AM      Result Value Range Status   Specimen Description STOOL   Final   Special Requests NONE   Final   Culture     Final   Value: Culture reincubated for better growth     Performed at [facility]   Report Status PENDING   Incomplete     Studies: Dg Knee 2 Views Left  08/29/2013   CLINICAL DATA:  Knee pain.  EXAM: LEFT KNEE - 1-2 VIEW  COMPARISON:  None  FINDINGS: Mild degenerative spurring along the tibial spines. The No acute bony abnormality. Specifically, no fracture, subluxation, or dislocation. Soft tissues are intact. No joint effusion.  IMPRESSION: No acute bony abnormality.   Electronically Signed   By: Charlett Nose M.D.   On: 08/29/2013 17:31    Scheduled Meds: . aspirin EC  81 mg Oral Daily  . atorvastatin  20 mg Oral Daily  . cefTRIAXone (ROCEPHIN) IVPB 2 gram/50 mL D5W (Pyxis)  2 g Intravenous Q12H  . feeding supplement  1 Container Oral TID BM  . saccharomyces boulardii  250 mg Oral BID   Continuous Infusions: . sodium chloride 0.9 % 1,000 mL with potassium chloride 40 mEq infusion 100 mL/hr at 08/31/13 1610    Active Problems:   Hypertension   Endocarditis   OSA (obstructive sleep apnea)   Hypokalemia   Anemia due to acute blood loss   Diarrhea   Vomiting    DENNIN, SARA A PA-S Algis Downs, PA-C  [facility] 08/31/2013, 11:27 AM  LOS: 2 days   Attending Patient seen and examined, agree with the assessment and plan, as outlined above. Still with significant amount of diarrhea, continue with supportive measures. Await ID follow up regarding  antibiotics.  S Ghimire

## 2013-08-31 NOTE — Evaluation (Signed)
Physical Therapy Evaluation Patient Details Name: Eric Chambers MRN: [id number] DOB: 11/10/1946 Today's Date: 08/31/2013 Time: 4098-1191 PT Time Calculation (min): 19 min  PT Assessment / Plan / Recommendation History of Present Illness  Patient is 67 yo male admitted with nausea/diarrhea/vomiting from antibiotics for endocarditis.  Patient recently "twisted" Lt knee and heard  a pop.  Patient now with pain and decreased ROM in Lt knee.  Xray negative.  Clinical Impression  Patient presents with problems listed below.  Will benefit from acute PT for mobility and Lt knee pain/ROM/strength.  Recommend f/u OP PT at discharge.  Would benefit from Orthopedic evaluation as well.    PT Assessment  Patient needs continued PT services    Follow Up Recommendations  Outpatient PT;Supervision for mobility/OOB    Does the patient have the potential to tolerate intense rehabilitation      Barriers to Discharge        Equipment Recommendations  Rolling walker with 5" wheels    Recommendations for Other Services Other (comment) (Recommend Orthopedic Consultation)   Frequency Min 4X/week    Precautions / Restrictions Precautions Precautions: None Restrictions Weight Bearing Restrictions: No   Pertinent Vitals/Pain Pain in Lt knee with ROM/movement impacting mobility.      Mobility  Bed Mobility Bed Mobility: Supine to Sit;Sit to Supine Supine to Sit: 7: Independent Sit to Supine: 7: Independent Details for Bed Mobility Assistance: Patient keeps LLE in extension to move it off of bed. Transfers Transfers: Sit to Stand;Stand to Sit Sit to Stand: 5: Supervision;With upper extremity assist;From bed Stand to Sit: 5: Supervision;With upper extremity assist;To bed Details for Transfer Assistance: Patient keeps Lt knee in extended position during transfers.  Painful to bear weight with knee in flexed position. Ambulation/Gait Ambulation/Gait Assistance: 5: Supervision Ambulation Distance  (Feet): 200 Feet Assistive device: None Ambulation/Gait Assistance Details: Patient with antalgic gait pattern with decreased knee flexion on Lt during gait. Gait Pattern: Step-to pattern;Decreased stance time - left;Decreased step length - right;Decreased weight shift to left;Antalgic    Exercises General Exercises - Lower Extremity Heel Slides: AROM;Left;10 reps;Supine;Seated   PT Diagnosis: Difficulty walking;Abnormality of gait;Acute pain  PT Problem List: Decreased strength;Decreased range of motion;Decreased mobility;Decreased knowledge of use of DME;Pain PT Treatment Interventions: DME instruction;Gait training;Stair training;Functional mobility training;Therapeutic exercise;Patient/family education     PT Goals(Current goals can be found in the care plan section) Acute Rehab PT Goals Patient Stated Goal: To decrease pain Lt knee PT Goal Formulation: With patient Time For Goal Achievement: 09/07/13 Potential to Achieve Goals: Good  Visit Information  Last PT Received On: 08/31/13 Assistance Needed: +1 History of Present Illness: Patient is 67 yo male admitted with nausea/diarrhea/vomiting from antibiotics for endocarditis.  Patient recently "twisted" Lt knee and heard  a pop.  Patient now with pain and decreased ROM in Lt knee.  Xray negative.       Prior Functioning  Home Living Family/patient expects to be discharged to:: Private residence Living Arrangements: Spouse/significant other;Parent Available Help at Discharge: Family;Available 24 hours/day Type of Home: House Home Access: Stairs to enter [employer] of Steps: 3 Entrance Stairs-Rails: Right;Left Home Layout: One level Home Equipment: None Prior Function Level of Independence: Independent Communication Communication: No difficulties    Cognition  Cognition Arousal/Alertness: Awake/alert Behavior During Therapy: WFL for tasks assessed/performed Overall Cognitive Status: Within Functional  Limits for tasks assessed    Extremity/Trunk Assessment Upper Extremity Assessment Upper Extremity Assessment: Overall WFL for tasks assessed Lower Extremity Assessment Lower Extremity Assessment:  LLE deficits/detail LLE Deficits / Details: ROM - full passive extension to 70 degrees flexion.  Limited by pain.  Strength 3-/5 again limited by pain. LLE: Unable to fully assess due to pain Cervical / Trunk Assessment Cervical / Trunk Assessment: Normal   Balance    End of Session PT - End of Session Equipment Utilized During Treatment: Gait belt Activity Tolerance: Patient limited by pain Patient left: in bed;with call bell/phone within reach Nurse Communication: Mobility status  GP     Vena Austria 08/31/2013, 6:42 PM Durenda Hurt. Renaldo Fiddler, [facility] Acute Rehab Services Pager [phone]

## 2013-08-31 NOTE — Progress Notes (Signed)
[facility]  Patient Status: Active (receiving services up to time of hospitalization)  [facility] is providing the following services: RN and Home Infusion Services (teaching and education will be done by nurse in the home with patient and caregiver)  If patient discharges after hours, please call [phone].   Eric Chambers 08/31/2013, 4:08 PM

## 2013-08-31 NOTE — Progress Notes (Signed)
INFECTIOUS DISEASE PROGRESS NOTE  ID: Xaiver Chambers is a 67 y.o. male with  Active Problems:   Hypertension   Endocarditis   OSA (obstructive sleep apnea)   Hypokalemia   Anemia due to acute blood loss   Diarrhea   Vomiting  Subjective: C/o loose BM.   Abtx:  Anti-infectives   Start     Dose/Rate Route Frequency Ordered Stop   08/29/13 2200  vancomycin (VANCOCIN) IVPB 1000 mg/200 mL premix  Status:  Discontinued     1,000 mg 200 mL/hr over 60 Minutes Intravenous Every 12 hours 08/29/13 2129 08/30/13 1705   08/29/13 2130  cefTRIAXone (ROCEPHIN) 2 g in dextrose 5 % 50 mL IVPB     2 g 100 mL/hr over 30 Minutes Intravenous Every 12 hours 08/29/13 2124     08/29/13 2130  vancomycin (VANCOCIN) 1,250 mg in sodium chloride 0.9 % 250 mL IVPB  Status:  Discontinued     1,250 mg 166.7 mL/hr over 90 Minutes Intravenous Every 12 hours 08/29/13 2124 08/29/13 2127      Medications:  Scheduled: . aspirin EC  81 mg Oral Daily  . atorvastatin  20 mg Oral Daily  . cefTRIAXone (ROCEPHIN) IVPB 2 gram/50 mL D5W (Pyxis)  2 g Intravenous Q12H  . feeding supplement  1 Container Oral TID BM  . saccharomyces boulardii  250 mg Oral BID    Objective: Vital signs in last 24 hours: Temp:  [98 F (36.7 C)-99.5 F (37.5 C)] 98.7 F (37.1 C) (09/12 1409) Pulse Rate:  [78-82] 78 (09/12 1409) Resp:  [20] 20 (09/12 1409) BP: (99-137)/(48-72) 130/72 mmHg (09/12 1409) SpO2:  [94 %-97 %] 97 % (09/12 1409) Weight:  [96.3 kg (212 lb 4.9 oz)] 96.3 kg (212 lb 4.9 oz) (09/12 0700)   General appearance: alert, cooperative and no distress Resp: clear to auscultation bilaterally Cardio: regular rate and rhythm GI: abnormal findings:  distended and hypoactive bowel sounds  Lab Results  Recent Labs  08/30/13 0640 08/31/13 0525  WBC 6.2 7.6  HGB 11.1* 11.2*  HCT 30.4* 30.8*  NA 140 139  K 2.9* 3.3*  CL 106 107  CO2 25 23  BUN 17 15  CREATININE 1.69* 1.80*   Liver Panel  Recent Labs  08/29/13 1503 08/30/13 0640  PROT 5.5* 5.2*  ALBUMIN 2.5* 2.3*  AST 18 16  ALT 29 26  ALKPHOS 35* 32*  BILITOT 0.4 0.3   Sedimentation Rate No results found for this basename: ESRSEDRATE,  in the last 72 hours C-Reactive Protein No results found for this basename: CRP,  in the last 72 hours  Microbiology: Recent Results (from the past 240 hour(s))  CLOSTRIDIUM DIFFICILE BY PCR     Status: None   Collection Time    08/29/13  5:47 PM      Result Value Range Status   C difficile by pcr NEGATIVE  NEGATIVE Final  CULTURE, BLOOD (ROUTINE X 2)     Status: None   Collection Time    08/29/13 10:12 PM      Result Value Range Status   Specimen Description BLOOD RIGHT ARM   Final   Special Requests BOTTLES DRAWN AEROBIC ONLY 5CC   Final   Culture  Setup Time     Final   Value: 08/30/2013 05:12     Performed at [facility]   Culture     Final   Value:        BLOOD CULTURE RECEIVED NO GROWTH  TO DATE CULTURE WILL BE HELD FOR 5 DAYS BEFORE ISSUING A FINAL NEGATIVE REPORT     Performed at [facility]   Report Status PENDING   Incomplete  CULTURE, BLOOD (ROUTINE X 2)     Status: None   Collection Time    08/29/13 10:32 PM      Result Value Range Status   Specimen Description BLOOD LEFT HAND   Final   Special Requests BOTTLES DRAWN AEROBIC ONLY 5CC   Final   Culture  Setup Time     Final   Value: 08/30/2013 05:13     Performed at [facility]   Culture     Final   Value:        BLOOD CULTURE RECEIVED NO GROWTH TO DATE CULTURE WILL BE HELD FOR 5 DAYS BEFORE ISSUING A FINAL NEGATIVE REPORT     Performed at [facility]   Report Status PENDING   Incomplete  STOOL CULTURE     Status: None   Collection Time    08/30/13 11:12 AM      Result Value Range Status   Specimen Description STOOL   Final   Special Requests NONE   Final   Culture     Final   Value: Culture reincubated for better growth     Performed at [facility]   Report Status  PENDING   Incomplete  OVA AND PARASITE EXAMINATION     Status: None   Collection Time    08/30/13 11:12 AM      Result Value Range Status   Specimen Description STOOL   Final   Special Requests NONE   Final   Ova and parasites     Final   Value: NO OVA OR PARASITES SEEN ABUNDANT WBC SEEN     Performed at [facility]   Report Status 08/31/2013 FINAL   Final    Studies/Results: Dg Knee 2 Views Left  08/29/2013   CLINICAL DATA:  Knee pain.  EXAM: LEFT KNEE - 1-2 VIEW  COMPARISON:  None  FINDINGS: Mild degenerative spurring along the tibial spines. The No acute bony abnormality. Specifically, no fracture, subluxation, or dislocation. Soft tissues are intact. No joint effusion.  IMPRESSION: No acute bony abnormality.   Electronically Signed   By: Charlett Nose M.D.   On: 08/29/2013 17:31     Assessment/Plan:  Presumed IE   TEE- 08-17-13- Tricuspid valve: There is echogenic strandlike object in  the right ventricle associated with the lateral aspect of  the tricuspid valve chaotic in motion which is concerning  for possible vegetation.   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  No further vanco Await BCx, further stool studies (stool cx).          Johny Sax Infectious Diseases (pager) [phone] [email or web address] 08/31/2013, 3:25 PM  LOS: 2 days

## 2013-09-01 ENCOUNTER — Inpatient Hospital Stay (HOSPITAL_COMMUNITY): Payer: Medicare Other

## 2013-09-01 LAB — URINALYSIS, ROUTINE W REFLEX MICROSCOPIC
Ketones, ur: NEGATIVE mg/dL
Leukocytes, UA: NEGATIVE
Nitrite: NEGATIVE
Specific Gravity, Urine: 1.006 (ref 1.005–1.030)
pH: 5 (ref 5.0–8.0)

## 2013-09-01 LAB — BASIC METABOLIC PANEL
CO2: 22 mEq/L (ref 19–32)
Chloride: 107 mEq/L (ref 96–112)
Creatinine, Ser: 1.89 mg/dL — ABNORMAL HIGH (ref 0.50–1.35)
GFR calc Af Amer: 41 mL/min — ABNORMAL LOW (ref >90)
Potassium: 3.7 mEq/L (ref 3.5–5.1)
Sodium: 139 mEq/L (ref 135–145)

## 2013-09-01 LAB — VANCOMYCIN, RANDOM: Vancomycin Rm: 5.5 ug/mL

## 2013-09-01 LAB — URINE MICROSCOPIC-ADD ON

## 2013-09-01 MED ORDER — HEPARIN SODIUM (PORCINE) 5000 UNIT/ML IJ SOLN
5000.0000 [IU] | Freq: Three times a day (TID) | INTRAMUSCULAR | Status: DC
  Administered 2013-09-01 – 2013-09-03 (×6): 5000 [IU] via SUBCUTANEOUS
  Filled 2013-09-01 (×10): qty 1

## 2013-09-01 NOTE — Progress Notes (Addendum)
PATIENT DETAILS Name: Eric Chambers Age: 67 y.o. Sex: male Date of Birth: January 05, 1946 Admit Date: 08/29/2013 Admitting Physician Drema Dallas, MD [id number] Hessie Diener, MD  Subjective: Diarrhea better today. No further vomiting.  Assessment/Plan: Active Problems: Acute Kidney Failure -suspect this is secondary to vol depletion from diarrhea, supratherapeutic Vanco level -avoid nephrotoxins -c/w IVF -renal ultrasound -check UA  Diarrhea -?etiology-?antibiotic related -stool studies so far neg -prn imodium and florastor -thankfully better  Hypokalemia -resolved -secondary to diarrhea  Recent Hx of Endocarditis -blood cultures on 8/28 neg -blood cultures on 9/10 neg so far -currently off all antibiotics-including Vanco/Rocephin-will defer to ID as to whether or not to restart antibiotics  Hypertension -continue to hold HCTZ/Telmisartan -BP currently controlled without use of any anti-hypertensives  Left Knee pain -XRay left knee neg -prn tylenol -avoid NSAID's    Disposition: Remain inpatient  DVT Prophylaxis: Prophylactic  Heparin   Code Status: Full code   Family Communication None at bedside  Procedures:  None  CONSULTS:  ID   MEDICATIONS: Scheduled Meds: . aspirin EC  81 mg Oral Daily  . atorvastatin  20 mg Oral Daily  . feeding supplement  1 Container Oral TID BM  . saccharomyces boulardii  250 mg Oral BID   Continuous Infusions: . sodium chloride 0.9 % 1,000 mL with potassium chloride 40 mEq infusion 100 mL/hr at 09/01/13 0532   PRN Meds:.acetaminophen, acetaminophen, barrier cream, ibuprofen, loperamide, menthol-cetylpyridinium, MUSCLE RUB, ondansetron, oxyCODONE, sodium chloride  Antibiotics: Anti-infectives   Start     Dose/Rate Route Frequency Ordered Stop   08/29/13 2200  vancomycin (VANCOCIN) IVPB 1000 mg/200 mL premix  Status:  Discontinued     1,000 mg 200 mL/hr over 60 Minutes Intravenous Every 12 hours 08/29/13 2129  08/30/13 1705   08/29/13 2130  cefTRIAXone (ROCEPHIN) 2 g in dextrose 5 % 50 mL IVPB  Status:  Discontinued     2 g 100 mL/hr over 30 Minutes Intravenous Every 12 hours 08/29/13 2124 08/31/13 1538   08/29/13 2130  vancomycin (VANCOCIN) 1,250 mg in sodium chloride 0.9 % 250 mL IVPB  Status:  Discontinued     1,250 mg 166.7 mL/hr over 90 Minutes Intravenous Every 12 hours 08/29/13 2124 08/29/13 2127       PHYSICAL EXAM: Vital signs in last 24 hours: Filed Vitals:   08/31/13 1300 08/31/13 1409 08/31/13 2021 09/01/13 0420  BP:  130/72 142/63 137/65  Pulse:  78 80 89  Temp: 98 F (36.7 C) 98.7 F (37.1 C) 99.6 F (37.6 C) 99 F (37.2 C)  TempSrc:  Oral Oral Oral  Resp:  20 18 18   Height:    5\' 9"  (1.753 m)  Weight:    92.7 kg (204 lb 5.9 oz)  SpO2:  97% 96% 95%    Weight change: -3.6 kg (-7 lb 15 oz) Filed Weights   08/29/13 2036 08/31/13 0700 09/01/13 0420  Weight: 94.6 kg (208 lb 8.9 oz) 96.3 kg (212 lb 4.9 oz) 92.7 kg (204 lb 5.9 oz)   Body mass index is 30.17 kg/(m^2).   Gen Exam: Awake and alert with clear speech.   Neck: Supple, No JVD.   Chest: B/L Clear.   CVS: S1 S2 Regular, no murmurs.  Abdomen: soft, BS +, non tender, non distended.  Extremities: no edema, lower extremities warm to touch. Neurologic: Non Focal.   Skin: No Rash.   Wounds: N/A.   Intake/Output from previous day:  Intake/Output Summary (Last 24 hours) at 09/01/13 1222 Last  data filed at 09/01/13 0911  Gross per 24 hour  Intake   1640 ml  Output    800 ml  Net    840 ml     LAB RESULTS: CBC  Recent Labs Lab 08/29/13 1503 08/30/13 0640 08/31/13 0525  WBC 6.3 6.2 7.6  HGB 11.5* 11.1* 11.2*  HCT 32.0* 30.4* 30.8*  PLT 137* 136* 159  MCV 86.5 87.1 87.5  MCH 31.1 31.8 31.8  MCHC 35.9 36.5* 36.4*  RDW 12.6 12.9 12.9  LYMPHSABS 0.8 0.9 0.9  MONOABS 0.8 0.9 1.0  EOSABS 0.1 0.1 0.1  BASOSABS 0.1 0.1 0.0    Chemistries   Recent Labs Lab 08/29/13 1503 08/30/13 0640  08/31/13 0525 09/01/13 0530  NA 140 140 139 139  K 2.8* 2.9* 3.3* 3.7  CL 104 106 107 107  CO2 25 25 23 22   GLUCOSE 105* 105* 129* 138*  BUN 20 17 15 15   CREATININE 1.64* 1.69* 1.80* 1.89*  CALCIUM 8.3* 8.0* 7.8* 7.9*  MG  --  1.5  --   --     CBG: No results found for this basename: GLUCAP,  in the last 168 hours  GFR Estimated Creatinine Clearance: 43.2 ml/min (by C-G formula based on Cr of 1.89).  Coagulation profile No results found for this basename: INR, PROTIME,  in the last 168 hours  Cardiac Enzymes No results found for this basename: CK, CKMB, TROPONINI, MYOGLOBIN,  in the last 168 hours  No components found with this basename: POCBNP,  No results found for this basename: DDIMER,  in the last 72 hours No results found for this basename: HGBA1C,  in the last 72 hours No results found for this basename: CHOL, HDL, LDLCALC, TRIG, CHOLHDL, LDLDIRECT,  in the last 72 hours No results found for this basename: TSH, T4TOTAL, FREET3, T3FREE, THYROIDAB,  in the last 72 hours No results found for this basename: VITAMINB12, FOLATE, FERRITIN, TIBC, IRON, RETICCTPCT,  in the last 72 hours No results found for this basename: LIPASE, AMYLASE,  in the last 72 hours  Urine Studies No results found for this basename: UACOL, UAPR, USPG, UPH, UTP, UGL, UKET, UBIL, UHGB, UNIT, UROB, ULEU, UEPI, UWBC, URBC, UBAC, CAST, CRYS, UCOM, BILUA,  in the last 72 hours  MICROBIOLOGY: Recent Results (from the past 240 hour(s))  CLOSTRIDIUM DIFFICILE BY PCR     Status: None   Collection Time    08/29/13  5:47 PM      Result Value Range Status   C difficile by pcr NEGATIVE  NEGATIVE Final  CULTURE, BLOOD (ROUTINE X 2)     Status: None   Collection Time    08/29/13 10:12 PM      Result Value Range Status   Specimen Description BLOOD RIGHT ARM   Final   Special Requests BOTTLES DRAWN AEROBIC ONLY 5CC   Final   Culture  Setup Time     Final   Value: 08/30/2013 05:12     Performed at [facility]   Culture     Final   Value:        BLOOD CULTURE RECEIVED NO GROWTH TO DATE CULTURE WILL BE HELD FOR 5 DAYS BEFORE ISSUING A FINAL NEGATIVE REPORT     Performed at [facility]   Report Status PENDING   Incomplete  CULTURE, BLOOD (ROUTINE X 2)     Status: None   Collection Time    08/29/13 10:32 PM  Result Value Range Status   Specimen Description BLOOD LEFT HAND   Final   Special Requests BOTTLES DRAWN AEROBIC ONLY 5CC   Final   Culture  Setup Time     Final   Value: 08/30/2013 05:13     Performed at [facility]   Culture     Final   Value:        BLOOD CULTURE RECEIVED NO GROWTH TO DATE CULTURE WILL BE HELD FOR 5 DAYS BEFORE ISSUING A FINAL NEGATIVE REPORT     Performed at [facility]   Report Status PENDING   Incomplete  STOOL CULTURE     Status: None   Collection Time    08/30/13 11:12 AM      Result Value Range Status   Specimen Description STOOL   Final   Special Requests NONE   Final   Culture     Final   Value: Culture reincubated for better growth     Performed at [facility]   Report Status PENDING   Incomplete  OVA AND PARASITE EXAMINATION     Status: None   Collection Time    08/30/13 11:12 AM      Result Value Range Status   Specimen Description STOOL   Final   Special Requests NONE   Final   Ova and parasites     Final   Value: NO OVA OR PARASITES SEEN ABUNDANT WBC SEEN     Performed at [facility]   Report Status 08/31/2013 FINAL   Final    RADIOLOGY STUDIES/RESULTS: Dg Orthopantogram  08/16/2013   *RADIOLOGY REPORT*  Clinical Data: Dental work several weeks ago on a left posterior mandibular tooth.  Evaluate for possible infection.  Prior history of endocarditis.  ORTHOPANTOGRAM/PANORAMIC  Comparison: None.  Findings: No periapical lucency involving any of the teeth to suggest abscess.  No intrinsic osseous abnormalities.  No visible dental caries.  Temporomandibular joints intact.  IMPRESSION:  Normal examination.   Original Report Authenticated By: Hulan Saas, M.D.   Dg Chest 2 View  08/16/2013   *RADIOLOGY REPORT*  Clinical Data: Cough and chills.  CHEST - 2 VIEW  Comparison: None  Findings: The cardiac silhouette, mediastinal and hilar contours are within normal limits.  The lungs are clear.  No pleural effusion.  The bony thorax is intact.  IMPRESSION: No acute cardiopulmonary findings.   Original Report Authenticated By: Rudie Meyer, M.D.   Dg Knee 2 Views Left  08/29/2013   CLINICAL DATA:  Knee pain.  EXAM: LEFT KNEE - 1-2 VIEW  COMPARISON:  None  FINDINGS: Mild degenerative spurring along the tibial spines. The No acute bony abnormality. Specifically, no fracture, subluxation, or dislocation. Soft tissues are intact. No joint effusion.  IMPRESSION: No acute bony abnormality.   Electronically Signed   By: Charlett Nose M.D.   On: 08/29/2013 17:31    Jeoffrey Massed, MD  [facility] Pager:[phone]  If 7PM-7AM, please contact night-coverage [email or web address] Password [email or web address] 09/01/2013, 12:22 PM   LOS: 3 days

## 2013-09-01 NOTE — Progress Notes (Signed)
Physical Therapy Discharge Patient Details Name: Eric Chambers MRN: [id number] DOB: [birth year] Today's Date: 09/01/2013 Time: 8413-2440 PT Time Calculation (min): 12 min  Patient discharged from PT services secondary to goals met and no further PT needs identified.  Please see latest therapy progress note for current level of functioning and progress toward goals.    Progress and discharge plan discussed with patient and/or caregiver: Patient/Caregiver agrees with plan  GP     Vena Austria 09/01/2013, 5:54 PM

## 2013-09-01 NOTE — Progress Notes (Signed)
Physical Therapy Treatment Patient Details Name: Eric Chambers MRN: [id number] DOB: [birth year] Today's Date: 09/01/2013 Time: 4098-1191 PT Time Calculation (min): 12 min  PT Assessment / Plan / Recommendation  History of Present Illness Patient is 67 yo male admitted with nausea/diarrhea/vomiting from antibiotics for endocarditis.  Patient recently "twisted" Lt knee and heard  a pop.  Patient now with pain and decreased ROM in Lt knee.  Xray negative.   PT Comments   Patient is functioning at supervision to mod I level with mobility.  Did well with stairs.  Able to complete Lt knee exercises independently.  Goals achieved with no further PT needs.  PT will sign off.  Follow Up Recommendations  Outpatient PT;Supervision - Intermittent (OP PT if knee does not continue to improve)     Does the patient have the potential to tolerate intense rehabilitation     Barriers to Discharge        Equipment Recommendations  None recommended by PT (Patient declined RW)    Recommendations for Other Services    Frequency Min 4X/week   Progress towards PT Goals Progress towards PT goals: Goals met/education completed, patient discharged from PT  Plan Current plan remains appropriate    Precautions / Restrictions Precautions Precautions: None Restrictions Weight Bearing Restrictions: No   Pertinent Vitals/Pain     Mobility  Bed Mobility Bed Mobility: Supine to Sit Supine to Sit: 7: Independent Transfers Transfers: Sit to Stand;Stand to Sit Sit to Stand: 6: Modified independent (Device/Increase time);With upper extremity assist;From bed Stand to Sit: 6: Modified independent (Device/Increase time);With upper extremity assist;To bed Details for Transfer Assistance: verbal cues for hand placement with use of RW. Ambulation/Gait Ambulation/Gait Assistance: 6: Modified independent (Device/Increase time) Ambulation Distance (Feet): 300 Feet Assistive device: Rolling walker Ambulation/Gait  Assistance Details: Verbal cues for safe use of RW.  Patient continued to have an antalgic gait pattern with decreased flexion of Lt knee during gait. Gait Pattern: Step-through pattern;Decreased stride length;Decreased stance time - left;Antalgic Stairs: Yes Stairs Assistance: 5: Supervision Stairs Assistance Details (indicate cue type and reason): Instructed patient to negotiate stairs using step-to pattern and 1 rail. Stair Management Technique: One rail Right;Step to pattern;Forwards Number of Stairs: 4    Exercises General Exercises - Lower Extremity Heel Slides: AROM;Left;10 reps;Seated     PT Goals (current goals can now be found in the care plan section)    Visit Information  Last PT Received On: 09/01/13 Assistance Needed: +1 History of Present Illness: Patient is 68 yo male admitted with nausea/diarrhea/vomiting from antibiotics for endocarditis.  Patient recently "twisted" Lt knee and heard  a pop.  Patient now with pain and decreased ROM in Lt knee.  Xray negative.    Subjective Data  Subjective: My knee is getting a little better.   Cognition  Cognition Arousal/Alertness: Awake/alert Behavior During Therapy: WFL for tasks assessed/performed Overall Cognitive Status: Within Functional Limits for tasks assessed    Balance     End of Session PT - End of Session Equipment Utilized During Treatment: Gait belt Activity Tolerance: Patient tolerated treatment well Patient left: in bed;with call bell/phone within reach (sitting EOB for dinner) Nurse Communication: Mobility status   GP     Vena Austria 09/01/2013, 5:52 PM Durenda Hurt. Renaldo Fiddler, [facility] Acute Rehab Services Pager [phone]

## 2013-09-02 DIAGNOSIS — R509 Fever, unspecified: Secondary | ICD-10-CM

## 2013-09-02 LAB — RENAL FUNCTION PANEL
Albumin: 2.3 g/dL — ABNORMAL LOW (ref 3.5–5.2)
BUN: 18 mg/dL (ref 6–23)
CO2: 22 mEq/L (ref 19–32)
Calcium: 8.1 mg/dL — ABNORMAL LOW (ref 8.4–10.5)
Chloride: 106 mEq/L (ref 96–112)
Creatinine, Ser: 1.88 mg/dL — ABNORMAL HIGH (ref 0.50–1.35)

## 2013-09-02 LAB — CK: Total CK: 88 U/L (ref 7–232)

## 2013-09-02 MED ORDER — BOOST / RESOURCE BREEZE PO LIQD: 1.0000 | Freq: Three times a day (TID) | ORAL | Status: DC

## 2013-09-02 MED ORDER — DEXTROSE 5 % IV SOLN: 2.0000 g | INTRAVENOUS | Status: DC

## 2013-09-02 MED ORDER — DAPTOMYCIN 500 MG IV SOLR
750.0000 mg | INTRAVENOUS | Status: DC
  Administered 2013-09-02 – 2013-09-03 (×2): 750 mg via INTRAVENOUS
  Filled 2013-09-02 (×3): qty 15

## 2013-09-02 MED ORDER — CIPROFLOXACIN HCL 500 MG PO TABS
500.0000 mg | ORAL_TABLET | Freq: Two times a day (BID) | ORAL | Status: DC
  Administered 2013-09-02 – 2013-09-03 (×3): 500 mg via ORAL
  Filled 2013-09-02 (×4): qty 1

## 2013-09-02 MED ORDER — AMLODIPINE BESYLATE 5 MG PO TABS
5.0000 mg | ORAL_TABLET | Freq: Every day | ORAL | Status: DC
  Administered 2013-09-02 – 2013-09-03 (×2): 5 mg via ORAL
  Filled 2013-09-02 (×2): qty 1

## 2013-09-02 MED ORDER — ALTEPLASE 2 MG IJ SOLR
2.0000 mg | Freq: Once | INTRAMUSCULAR | Status: AC
  Administered 2013-09-02: 18:00:00 2 mg
  Filled 2013-09-02: qty 2

## 2013-09-02 MED ORDER — SODIUM CHLORIDE 0.9 % IV SOLN: 8.0000 mg/kg | INTRAVENOUS | Status: DC

## 2013-09-02 MED ORDER — SODIUM CHLORIDE 0.9 % IJ SOLN
10.0000 mL | INTRAMUSCULAR | Status: DC | PRN
  Administered 2013-09-02 – 2013-09-03 (×3): 10 mL

## 2013-09-02 MED ORDER — AMLODIPINE BESYLATE 5 MG PO TABS: 5.0000 mg | ORAL_TABLET | Freq: Every day | ORAL | Status: DC

## 2013-09-02 NOTE — Progress Notes (Signed)
ANTIBIOTIC CONSULT NOTE - FOLLOW UP  Pharmacy Consult for daptomycin Indication: culture negative endocarditis  No Known Allergies  Patient Measurements: Height: 5\' 9"  (175.3 cm) Weight: 206 lb 6.4 oz (93.622 kg) IBW/kg (Calculated) : 70.7  Vital Signs: Temp: 98.8 F (37.1 C) (09/14 1400) Temp src: Oral (09/14 1400) BP: 134/69 mmHg (09/14 1400) Pulse Rate: 80 (09/14 1400) Intake/Output from previous day: 09/13 0701 - 09/14 0700 In: 806.7 [P.O.:240; I.V.:566.7] Out: 2550 [Urine:2550] Intake/Output from this shift: Total I/O In: -  Out: 700 [Urine:700]  Labs:  Recent Labs  08/31/13 0525 09/01/13 0530 09/02/13 0617  WBC 7.6  --   --   HGB 11.2*  --   --   PLT 159  --   --   CREATININE 1.80* 1.89* 1.88*   Estimated Creatinine Clearance: 43.7 ml/min (by C-G formula based on Cr of 1.88).  Recent Labs  09/01/13 0900  VANCORANDOM 5.5     Microbiology: Recent Results (from the past 720 hour(s))  CULTURE, BLOOD (ROUTINE X 2)     Status: None   Collection Time    08/16/13 12:50 PM      Result Value Range Status   Specimen Description BLOOD LEFT ARM   Final   Special Requests BOTTLES DRAWN AEROBIC AND ANAEROBIC 10CC   Final   Culture  Setup Time     Final   Value: 08/16/2013 17:41     Performed at [facility]   Culture     Final   Value: NO GROWTH 5 DAYS     Performed at [facility]   Report Status 08/22/2013 FINAL   Final  CULTURE, BLOOD (ROUTINE X 2)     Status: None   Collection Time    08/16/13 12:55 PM      Result Value Range Status   Specimen Description BLOOD RIGHT ARM   Final   Special Requests BOTTLES DRAWN AEROBIC ONLY 10 ML   Final   Culture  Setup Time     Final   Value: 08/16/2013 17:41     Performed at [facility]   Culture     Final   Value: NO GROWTH 5 DAYS     Performed at [facility]   Report Status 08/22/2013 FINAL   Final  URINE CULTURE     Status: None   Collection Time    08/16/13  1:00 PM   Result Value Range Status   Specimen Description URINE, CLEAN CATCH   Final   Special Requests NONE   Final   Culture  Setup Time     Final   Value: 08/16/2013 17:48     Performed at [facility] Count     Final   Value: NO GROWTH     Performed at [facility]   Culture     Final   Value: NO GROWTH     Performed at [facility]   Report Status 08/17/2013 FINAL   Final  CULTURE, BLOOD (SINGLE)     Status: None   Collection Time    08/16/13  8:40 PM      Result Value Range Status   Specimen Description BLOOD LEFT ARM   Final   Special Requests BOTTLES DRAWN AEROBIC ONLY 3CC   Final   Culture  Setup Time     Final   Value: 08/17/2013 05:28     Performed at [facility]  Final   Value: NO GROWTH 5 DAYS     Performed at [facility]   Report Status 08/23/2013 FINAL   Final  CLOSTRIDIUM DIFFICILE BY PCR     Status: None   Collection Time    08/29/13  5:47 PM      Result Value Range Status   C difficile by pcr NEGATIVE  NEGATIVE Final  CULTURE, BLOOD (ROUTINE X 2)     Status: None   Collection Time    08/29/13 10:12 PM      Result Value Range Status   Specimen Description BLOOD RIGHT ARM   Final   Special Requests BOTTLES DRAWN AEROBIC ONLY 5CC   Final   Culture  Setup Time     Final   Value: 08/30/2013 05:12     Performed at [facility]   Culture     Final   Value:        BLOOD CULTURE RECEIVED NO GROWTH TO DATE CULTURE WILL BE HELD FOR 5 DAYS BEFORE ISSUING A FINAL NEGATIVE REPORT     Performed at [facility]   Report Status PENDING   Incomplete  CULTURE, BLOOD (ROUTINE X 2)     Status: None   Collection Time    08/29/13 10:32 PM      Result Value Range Status   Specimen Description BLOOD LEFT HAND   Final   Special Requests BOTTLES DRAWN AEROBIC ONLY 5CC   Final   Culture  Setup Time     Final   Value: 08/30/2013 05:13     Performed at [facility]   Culture     Final   Value:         BLOOD CULTURE RECEIVED NO GROWTH TO DATE CULTURE WILL BE HELD FOR 5 DAYS BEFORE ISSUING A FINAL NEGATIVE REPORT     Performed at [facility]   Report Status PENDING   Incomplete  STOOL CULTURE     Status: None   Collection Time    08/30/13 11:12 AM      Result Value Range Status   Specimen Description STOOL   Final   Special Requests NONE   Final   Culture     Final   Value: NO SUSPICIOUS COLONIES, CONTINUING TO HOLD     Performed at [facility]   Report Status PENDING   Incomplete  OVA AND PARASITE EXAMINATION     Status: None   Collection Time    08/30/13 11:12 AM      Result Value Range Status   Specimen Description STOOL   Final   Special Requests NONE   Final   Ova and parasites     Final   Value: NO OVA OR PARASITES SEEN ABUNDANT WBC SEEN     Performed at [facility]   Report Status 08/31/2013 FINAL   Final    Anti-infectives   Start     Dose/Rate Route Frequency Ordered Stop   09/02/13 1600  DAPTOmycin (CUBICIN) 750 mg in sodium chloride 0.9 % IVPB     750 mg 230 mL/hr over 30 Minutes Intravenous Every 24 hours 09/02/13 1543     09/02/13 0000  sodium chloride 0.9 % SOLN 100 mL with DAPTOmycin 500 MG SOLR 749 mg     8 mg/kg  93.6 kg 230 mL/hr over 30 Minutes Intravenous Every 24 hours 09/02/13 1047     09/02/13 0000  dextrose 5 % SOLN 50 mL with  cefTRIAXone 2 G SOLR 2 g  Status:  Discontinued     2 g 100 mL/hr over 30 Minutes Intravenous Every 24 hours 09/02/13 1047 09/02/13    08/29/13 2200  vancomycin (VANCOCIN) IVPB 1000 mg/200 mL premix  Status:  Discontinued     1,000 mg 200 mL/hr over 60 Minutes Intravenous Every 12 hours 08/29/13 2129 08/30/13 1705   08/29/13 2130  cefTRIAXone (ROCEPHIN) 2 g in dextrose 5 % 50 mL IVPB  Status:  Discontinued     2 g 100 mL/hr over 30 Minutes Intravenous Every 12 hours 08/29/13 2124 08/31/13 1538   08/29/13 2130  vancomycin (VANCOCIN) 1,250 mg in sodium chloride 0.9 % 250 mL IVPB  Status:   Discontinued     1,250 mg 166.7 mL/hr over 90 Minutes Intravenous Every 12 hours 08/29/13 2124 08/29/13 2127      Assessment: 67 yo M with history of endocarditis presented on 9/10 for potentially infected IV line complaining of diarrhea for 3 days after starting vancomycin. Patient originally on vanc and rocephin. Now both have been discontinued and pharmacy is consulted to dose daptomycin. Planned length of therapy: 4 weeks.  WBC 7.6 T max 99 SCr 1.88 CrCl ~43 Stool cultures 9/11>>NGTD Blood cultures 9/10 >> NGTD C diff >> negative   Goal of Therapy:  Eradication of infection  Plan:  -Daptomycin 750mg  (8 mg/kg) IV q 24 hours . -Check baseline CK, f/u CK weekly while on therapy. -f/u renal function, clinical progression, and cultures -hold statin while on dapto.  Omir Cooprider C. Owin Vignola, PharmD Clinical Pharmacist-Resident Pager: [phone] Pharmacy: [phone] 09/02/2013 3:48 PM   Josey Dettmann, Gwenith Daily 09/02/2013,3:44 PM

## 2013-09-02 NOTE — Progress Notes (Signed)
Called to patient's room. Pt c/o sweating while cubicin infusing.  Pt denied any c/o SOB, chest pain, throat tightness, itching, or rash.  Will continue to monitor and mention to Dr. Jerral Ralph.

## 2013-09-02 NOTE — Progress Notes (Signed)
INFECTIOUS DISEASE PROGRESS NOTE  ID: Eric Chambers is a 67 y.o. male with  Active Problems:   Hypertension   Endocarditis   OSA (obstructive sleep apnea)   Hypokalemia   Anemia due to acute blood loss   Diarrhea   Vomiting  Subjective: No longer having loose stool. He is concern to restarting antibiotics to causing his symptoms of watery diarrhea, poor appetite, nausea. He also disclosed that it has been difficult to infuse antibiotics at home   Abtx:  Anti-infectives   Start     Dose/Rate Route Frequency Ordered Stop   09/02/13 0000  sodium chloride 0.9 % SOLN 100 mL with DAPTOmycin 500 MG SOLR 749 mg     8 mg/kg  93.6 kg 230 mL/hr over 30 Minutes Intravenous Every 24 hours 09/02/13 1047     09/02/13 0000  dextrose 5 % SOLN 50 mL with cefTRIAXone 2 G SOLR 2 g     2 g 100 mL/hr over 30 Minutes Intravenous Every 24 hours 09/02/13 1047     08/29/13 2200  vancomycin (VANCOCIN) IVPB 1000 mg/200 mL premix  Status:  Discontinued     1,000 mg 200 mL/hr over 60 Minutes Intravenous Every 12 hours 08/29/13 2129 08/30/13 1705   08/29/13 2130  cefTRIAXone (ROCEPHIN) 2 g in dextrose 5 % 50 mL IVPB  Status:  Discontinued     2 g 100 mL/hr over 30 Minutes Intravenous Every 12 hours 08/29/13 2124 08/31/13 1538   08/29/13 2130  vancomycin (VANCOCIN) 1,250 mg in sodium chloride 0.9 % 250 mL IVPB  Status:  Discontinued     1,250 mg 166.7 mL/hr over 90 Minutes Intravenous Every 12 hours 08/29/13 2124 08/29/13 2127      Medications:  Scheduled: . amLODipine  5 mg Oral Daily  . aspirin EC  81 mg Oral Daily  . atorvastatin  20 mg Oral Daily  . feeding supplement  1 Container Oral TID BM  . heparin subcutaneous  5,000 Units Subcutaneous Q8H  . saccharomyces boulardii  250 mg Oral BID    Objective: Vital signs in last 24 hours: Temp:  [98.1 F (36.7 C)-99.6 F (37.6 C)] 99 F (37.2 C) (09/14 0644) Pulse Rate:  [70-90] 76 (09/14 0644) Resp:  [16] 16 (09/14 0644) BP:  (134-157)/(72-76) 157/76 mmHg (09/14 0644) SpO2:  [96 %-98 %] 96 % (09/14 0644) Weight:  [206 lb 6.4 oz (93.622 kg)] 206 lb 6.4 oz (93.622 kg) (09/14 1610)   Physical Exam  Constitutional: He is oriented to person, place, and time. He appears well-developed and well-nourished. No distress.  HENT:  Mouth/Throat: Oropharynx is clear and moist. No oropharyngeal exudate.  Cardiovascular: Normal rate, regular rhythm and normal heart sounds. Exam reveals no gallop and no friction rub.  No murmur heard.  Pulmonary/Chest: Effort normal and breath sounds normal. No respiratory distress. He has no wheezes.  Abdominal: Soft. Bowel sounds are normal. He exhibits no distension. There is no tenderness.  Lymphadenopathy:  He has no cervical adenopathy.  Neurological: He is alert and oriented to person, place, and time.  Skin: Skin is warm and dry. No rash noted. No erythema.  Psychiatric: He has a normal mood and affect. His behavior is normal.    Lab Results  Recent Labs  08/31/13 0525 09/01/13 0530 09/02/13 0617  WBC 7.6  --   --   HGB 11.2*  --   --   HCT 30.8*  --   --   NA 139 139 139  K 3.3* 3.7 3.6  CL 107 107 106  CO2 23 22 22   BUN 15 15 18   CREATININE 1.80* 1.89* 1.88*   Liver Panel  Recent Labs  09/02/13 0617  ALBUMIN 2.3*    Microbiology: Recent Results (from the past 240 hour(s))  CLOSTRIDIUM DIFFICILE BY PCR     Status: None   Collection Time    08/29/13  5:47 PM      Result Value Range Status   C difficile by pcr NEGATIVE  NEGATIVE Final  CULTURE, BLOOD (ROUTINE X 2)     Status: None   Collection Time    08/29/13 10:12 PM      Result Value Range Status   Specimen Description BLOOD RIGHT ARM   Final   Special Requests BOTTLES DRAWN AEROBIC ONLY 5CC   Final   Culture  Setup Time     Final   Value: 08/30/2013 05:12     Performed at [facility]   Culture     Final   Value:        BLOOD CULTURE RECEIVED NO GROWTH TO DATE CULTURE WILL BE HELD FOR 5 DAYS  BEFORE ISSUING A FINAL NEGATIVE REPORT     Performed at [facility]   Report Status PENDING   Incomplete  CULTURE, BLOOD (ROUTINE X 2)     Status: None   Collection Time    08/29/13 10:32 PM      Result Value Range Status   Specimen Description BLOOD LEFT HAND   Final   Special Requests BOTTLES DRAWN AEROBIC ONLY 5CC   Final   Culture  Setup Time     Final   Value: 08/30/2013 05:13     Performed at [facility]   Culture     Final   Value:        BLOOD CULTURE RECEIVED NO GROWTH TO DATE CULTURE WILL BE HELD FOR 5 DAYS BEFORE ISSUING A FINAL NEGATIVE REPORT     Performed at [facility]   Report Status PENDING   Incomplete  STOOL CULTURE     Status: None   Collection Time    08/30/13 11:12 AM      Result Value Range Status   Specimen Description STOOL   Final   Special Requests NONE   Final   Culture     Final   Value: NO SUSPICIOUS COLONIES, CONTINUING TO HOLD     Performed at [facility]   Report Status PENDING   Incomplete  OVA AND PARASITE EXAMINATION     Status: None   Collection Time    08/30/13 11:12 AM      Result Value Range Status   Specimen Description STOOL   Final   Special Requests NONE   Final   Ova and parasites     Final   Value: NO OVA OR PARASITES SEEN ABUNDANT WBC SEEN     Performed at [facility]   Report Status 08/31/2013 FINAL   Final    Studies/Results: US Renal  09/01/2013   CLINICAL DATA:  67 year old male with renal failure.  EXAM: RENAL/URINARY TRACT ULTRASOUND COMPLETE  COMPARISON:  None.  FINDINGS: Right Kidney: No hydronephrosis. Renal length 12.5 cm. Echotexture within normal limits.  Left Kidney: No hydronephrosis. Renal length 13.4 cm. Cortical echotexture within normal limits. Small exophytic hypoechoic probable simple cysts measuring up to 13 mm laterally.  Bladder:  Appears normal for degree of bladder distention.  IMPRESSION: No  acute renal findings.  Small simple appearing left renal cyst.    Electronically Signed   By: Augusto Gamble M.D.   On: 09/01/2013 12:58     Assessment/Plan: 67yo M with prior history of native AV endocarditis several years agom, tx with 6 wk of PCN, found to have fevers, and TEE on 8/29 possible vegetation on TV. Culture negative endocarditis work up is negative for chlamydia serology. He was started on vancomycin and ceftriaxone for the past 2 wks and admitted for AKI, hypokalemia, profuse diarrhea. Diarrhea work up negative for virus, cdifficile, normal flora on culture.  patient finished 2 of 6 wks of vancomycin and ceftriaxone for presumed IE of tricuspid valve. Admitted for dehydration, AKI, diarrhea.   = Recommend to finish his course of therapy with daptomycin 8mg /kg per day if GFR>30. Will add on baseline CK today. Will stop his atorvastatin for now. Plan for addn 4 wks of therapy. Give first dose today to see how he tolerates.  Instead of ceftriaxone , we will do ciprofloxacin 500mg  PO Q 12 hr to see if he tolerates without signficant diarrhea. Albeit this is an unusual regimen for culture negative endocarditis, but this would be acceptable given that he did not tolerate vancomycin and beta lactams. We have not given gentamicin to avoid nephrotoxicity  Will send off serologies for coxiella, brucella, legionella, whipple's disease since not done, likely low yield.  If he still has persistent diarrhea, consider getting GI consultation to do colonoscopy and getting biopsy to look for whipple's disease vs. Other causes of colitis   - will arrange for follow up visit in ID clinic in 7-10 days         Araceli Coufal Infectious Diseases (pager) [phone] [email or web address] 09/02/2013, 1:13 PM  LOS: 4 days

## 2013-09-02 NOTE — Discharge Summary (Signed)
PATIENT DETAILS Name: Eric Chambers Age: 67 y.o. Sex: male Date of Birth: 02/24/1946 MRN: [id number]. Admit Date: 08/29/2013 Admitting Physician: Drema Dallas, MD WUJ:[id number] Hessie Diener, MD  Recommendations for Outpatient Follow-up:   Patient will see Dr. Daiva Eves of Infectious Disease to follow his endocarditis  Please monitor renal function weekly this can be done by [facility] - sending the results to Dr. Daiva Eves  Will receive Daptomycin daily to be infused by [facility] until 09/27/2013.  PRIMARY DISCHARGE DIAGNOSIS:  Active Problems:   Diarrhea causing electrolyte abnormalities.  Likely secondary to antibiotics.   Hypertension   Endocarditis-?culture neg endocarditis   OSA (obstructive sleep apnea)   Hypokalemia   Anemia due to acute blood loss   Vomiting      PAST MEDICAL HISTORY: Past Medical History  Diagnosis Date  . Endocarditis   . High cholesterol   . Hypertension   . Enlarged prostate   . Heart murmur     "think I have one now" (08/16/2013)  . Pneumonia     "almost once/yr" (08/16/2013)  . Sleep apnea     "don't wear mask" (08/16/2013)    DISCHARGE MEDICATIONS:   Medication List    STOP taking these medications       dextrose 5 % SOLN 50 mL with cefTRIAXone 2 G SOLR 2 g     ibuprofen 200 MG tablet  Commonly known as:  ADVIL,MOTRIN     sodium chloride 0.9 % SOLN 250 mL with vancomycin 10 G SOLR 1,250 mg     telmisartan-hydrochlorothiazide 40-12.5 MG per tablet  Commonly known as:  MICARDIS HCT      TAKE these medications       amLODipine 5 MG tablet  Commonly known as:  NORVASC  Take 1 tablet (5 mg total) by mouth daily.     atorvastatin 20 MG tablet  Commonly known as:  LIPITOR  Take 20 mg by mouth daily.     ciprofloxacin 500 MG tablet  Commonly known as:  CIPRO  Take 1 tablet (500 mg total) by mouth every 12 (twelve) hours.     feeding supplement Liqd  Take 1 Container by mouth 3 (three) times daily between meals.      sodium chloride 0.9 % injection  10-40 mLs by Intracatheter route as needed (flush).     sodium chloride 0.9 % SOLN 100 mL with DAPTOmycin 500 MG SOLR 749 mg  Inject 749 mg into the vein daily.     sodium chloride 0.9 % SOLN 100 mL with DAPTOmycin 500 MG SOLR 750 mg  Inject 750 mg into the vein daily.        ALLERGIES:  No Known Allergies  BRIEF HPI:  See H&P, Labs, Consult and Test reports for all details in brief, patient is a 67 yo white male with a prior hx of tricuspid valve endocarditis, recent hospitalization for fevers/chills-TEE done showed possible tricuspid endocarditis. Patient was placed on Vanco/Rocephin, two weeks into therapy, patient started having persistent vomiting and diarrhea, he then presented to the ED for further evaluation and treatment.  CONSULTATIONS:   ID  PERTINENT RADIOLOGIC STUDIES: Dg Orthopantogram  08/16/2013   *RADIOLOGY REPORT*  Clinical Data: Dental work several weeks ago on a left posterior mandibular tooth.  Evaluate for possible infection.  Prior history of endocarditis.  ORTHOPANTOGRAM/PANORAMIC  Comparison: None.  Findings: No periapical lucency involving any of the teeth to suggest abscess.  No intrinsic osseous abnormalities.  No visible dental  caries.  Temporomandibular joints intact.  IMPRESSION: Normal examination.   Original Report Authenticated By: Hulan Saas, M.D.   Dg Chest 2 View  08/16/2013   *RADIOLOGY REPORT*  Clinical Data: Cough and chills.  CHEST - 2 VIEW  Comparison: None  Findings: The cardiac silhouette, mediastinal and hilar contours are within normal limits.  The lungs are clear.  No pleural effusion.  The bony thorax is intact.  IMPRESSION: No acute cardiopulmonary findings.   Original Report Authenticated By: Rudie Meyer, M.D.   Dg Knee 2 Views Left  08/29/2013   CLINICAL DATA:  Knee pain.  EXAM: LEFT KNEE - 1-2 VIEW  COMPARISON:  None  FINDINGS: Mild degenerative spurring along the tibial spines. The No acute  bony abnormality. Specifically, no fracture, subluxation, or dislocation. Soft tissues are intact. No joint effusion.  IMPRESSION: No acute bony abnormality.   Electronically Signed   By: Charlett Nose M.D.   On: 08/29/2013 17:31   US Renal  09/01/2013   CLINICAL DATA:  67 year old male with renal failure.  EXAM: RENAL/URINARY TRACT ULTRASOUND COMPLETE  COMPARISON:  None.  FINDINGS: Right Kidney: No hydronephrosis. Renal length 12.5 cm. Echotexture within normal limits.  Left Kidney: No hydronephrosis. Renal length 13.4 cm. Cortical echotexture within normal limits. Small exophytic hypoechoic probable simple cysts measuring up to 13 mm laterally.  Bladder:  Appears normal for degree of bladder distention.  IMPRESSION: No acute renal findings.  Small simple appearing left renal cyst.   Electronically Signed   By: Augusto Gamble M.D.   On: 09/01/2013 12:58     PERTINENT LAB RESULTS: CBC: No results found for this basename: WBC, HGB, HCT, PLT,  in the last 72 hours CMET CMP     Component Value Date/Time   NA 140 09/03/2013 0410   K 3.3* 09/03/2013 0410   CL 103 09/03/2013 0410   CO2 23 09/03/2013 0410   GLUCOSE 123* 09/03/2013 0410   BUN 19 09/03/2013 0410   CREATININE 1.75* 09/03/2013 0410   CALCIUM 8.3* 09/03/2013 0410   PROT 5.2* 08/30/2013 0640   ALBUMIN 2.3* 09/02/2013 0617   AST 16 08/30/2013 0640   ALT 26 08/30/2013 0640   ALKPHOS 32* 08/30/2013 0640   BILITOT 0.3 08/30/2013 0640   GFRNONAA 39* 09/03/2013 0410   GFRAA 45* 09/03/2013 0410    Microbiology: Recent Results (from the past 240 hour(s))  CLOSTRIDIUM DIFFICILE BY PCR     Status: None   Collection Time    08/29/13  5:47 PM      Result Value Range Status   C difficile by pcr NEGATIVE  NEGATIVE Final  CULTURE, BLOOD (ROUTINE X 2)     Status: None   Collection Time    08/29/13 10:12 PM      Result Value Range Status   Specimen Description BLOOD RIGHT ARM   Final   Special Requests BOTTLES DRAWN AEROBIC ONLY 5CC   Final   Culture   Setup Time     Final   Value: 08/30/2013 05:12     Performed at [facility]   Culture     Final   Value:        BLOOD CULTURE RECEIVED NO GROWTH TO DATE CULTURE WILL BE HELD FOR 5 DAYS BEFORE ISSUING A FINAL NEGATIVE REPORT     Performed at [facility]   Report Status PENDING   Incomplete  CULTURE, BLOOD (ROUTINE X 2)     Status: None   Collection Time  08/29/13 10:32 PM      Result Value Range Status   Specimen Description BLOOD LEFT HAND   Final   Special Requests BOTTLES DRAWN AEROBIC ONLY 5CC   Final   Culture  Setup Time     Final   Value: 08/30/2013 05:13     Performed at [facility]   Culture     Final   Value:        BLOOD CULTURE RECEIVED NO GROWTH TO DATE CULTURE WILL BE HELD FOR 5 DAYS BEFORE ISSUING A FINAL NEGATIVE REPORT     Performed at [facility]   Report Status PENDING   Incomplete  STOOL CULTURE     Status: None   Collection Time    08/30/13 11:12 AM      Result Value Range Status   Specimen Description STOOL   Final   Special Requests NONE   Final   Culture     Final   Value: NO SALMONELLA, SHIGELLA, CAMPYLOBACTER, YERSINIA, OR E.COLI 0157:H7 ISOLATED     Performed at [facility]   Report Status 09/03/2013 FINAL   Final  OVA AND PARASITE EXAMINATION     Status: None   Collection Time    08/30/13 11:12 AM      Result Value Range Status   Specimen Description STOOL   Final   Special Requests NONE   Final   Ova and parasites     Final   Value: NO OVA OR PARASITES SEEN ABUNDANT WBC SEEN     Performed at [facility]   Report Status 08/31/2013 FINAL   Final     BRIEF HOSPITAL COURSE:  Acute Kidney Failure  -suspect this was secondary to vol depletion from diarrhea, supratherapeutic Vanco level  - Patient was admitted and started on IV fluids, vancomycin was discontinued.  - Creatinine slowly trended upwards, however on 9/15 it seems to have started to trend down at 1.75.  Renal ultrasound does not  show any hydronephrosis. UA did not show any proteinuria.  - Continue avoid nephrotoxins. Patient upon discharge will need close monitoring of renal function.  Diarrhea  -?etiology-?antibiotic related-stopped after stopping Vanco/Rocephin, briefly recurred after beginning Cipro/Cubicin-however then again resolved. He claims that on 9/15-that no further diarrhea, he has been ambulating in the hallway, and is requesting discharge. [facility] will administer Daptomycin at home. -stool studies so far neg  -prn imodium and florastor  -thankfully resolved   Hypokalemia  -resolved  -secondary to diarrhea   Recent Hx of Endocarditis -?culture negative -blood cultures on 8/28 neg  -blood cultures on 9/10 neg so far  -Has received 18 days of antibiotic therapy.  Started on Cipro and Daptomycin by ID (Dr. Drue Second) will stay on this regimen until 10/9 for 6 weeks of therapy.   -He will be followed by ID in their clinic.  1st appt is 9/18 at 9:00.  Hypertension  -continue to hold HCTZ/Telmisartan  -BP currently controlled without use of any anti-hypertensives- BP starting to increase, will switch to amlodipine on discharge.  Left Knee pain  -XRay left knee neg  -prn tylenol  -avoid NSAID's given mild ARF   TODAY-DAY OF DISCHARGE:  Subjective:   Complained of diarrhea overnight but this has resolved.  Patient wants to go home.  Is eating well.  Objective:   Blood pressure 136/76, pulse 86, temperature 98 F (36.7 C), temperature source Oral, resp. rate 20, height 5\' 9"  (1.753 m), weight 95.255 kg (  210 lb), SpO2 97.00%.  Intake/Output Summary (Last 24 hours) at 09/03/13 1628 Last data filed at 09/03/13 1409  Gross per 24 hour  Intake 2052.66 ml  Output   1250 ml  Net 802.66 ml   Filed Weights   09/01/13 0420 09/02/13 0644 09/02/13 2138  Weight: 92.7 kg (204 lb 5.9 oz) 93.622 kg (206 lb 6.4 oz) 95.255 kg (210 lb)    Exam General: Awake Alert, Oriented *3, No new F.N  deficits, Normal affect Supple Neck,No JVD, No cervical lymphadenopathy appriciated.  Resp:  Symmetrical Chest wall movement, Good air movement bilaterally, CTAB CV:  RRR,No Gallops,Rubs or new Murmurs, No Parasternal Heave Abdomen: +ve B.Sounds, Abd Soft, Non tender, No organomegaly appriciated, No rebound -guarding or rigidity. Ext: No Cyanosis, Clubbing or edema,  Skin:  No new Rash or bruise  DISCHARGE CONDITION: Stable  DISPOSITION: Home  DISCHARGE INSTRUCTIONS:    Activity:  As tolerated   Diet recommendation: Heart Healthy diet   Discharge Orders   Future Appointments Provider Department Dept Phone   09/06/2013 9:00 AM Randall Hiss, MD [facility] for Infectious Disease [phone]   10/05/2013 10:30 AM [facility] SITE 3 ECHO LAB [phone]   10/09/2013 9:30 AM Donato Schultz, MD [facility] [phone]   Future Orders Complete By Expires   Diet - low sodium heart healthy  As directed    Increase activity slowly  As directed      Follow-up Information   Follow up with [name], MD. Schedule an appointment as soon as possible for a visit in 2 weeks.   Specialty:  Family Medicine   Contact information:   [address] [place] [address] [phone]       Follow up with Acey Lav, MD On 09/06/2013. (9:00 am)    Specialty:  Infectious Diseases   Contact information:   [address] [place] [address] [phone]         Total Time spent on discharge equals 45 minutes.  Signed: Conley Canal [facility] 09/03/2013 4:28 PM   Attending Patient seen and examined, agree with the assessment and plan as outlined above. He wants to go home today. Claims that he has no diarrhea now, did have some diarrhea overnight-but no diarrhea in the morning/afternoon. He is requesting discharge, will discharge at his request. Rest  as above. ID will arrange follow up at ID Clinic.  Windell Norfolk MD

## 2013-09-02 NOTE — Progress Notes (Addendum)
PATIENT DETAILS Name: Eric Chambers Age: 67 y.o. Sex: male Date of Birth: Aug 25, 1946 Admit Date: 08/29/2013 Admitting Physician Drema Dallas, MD [id number] Hessie Diener, MD  Subjective: No further diarrhea vomiting  Assessment/Plan: Active Problems: Acute Kidney Failure -suspect this is secondary to vol depletion from diarrhea, supratherapeutic Vanco level - Patient was admitted and started on IV fluids, vancomycin was discontinued. - Creatinine slowly trended upwards, however on 9/14 he seems to have plateaued, creatinine 1.88. Suspect it will now start coming down.Renal ultrasound does not show any hydronephrosis. UA did not show any proteinuria. - Continue avoid nephrotoxins - Stop IVF  Diarrhea -?etiology-?antibiotic related -stool studies so far neg -prn imodium and florastor -thankfully resolved  Hypokalemia -resolved -secondary to diarrhea  Recent Hx of Endocarditis -blood cultures on 8/28 neg -blood cultures on 9/10 neg so far -currently off all antibiotics-including Vanco/Rocephin-will defer to ID as to whether or not to restart antibiotics. Dr. Ilsa Iha will see patient in consult for further recommendations  Hypertension -continue to hold HCTZ/Telmisartan -BP currently controlled without use of any anti-hypertensives- BP starting to increase, will switch to amlodipine.  Left Knee pain -XRay left knee neg -prn tylenol -avoid NSAID's    Disposition: Remain inpatient-suspect discharge either today or tomorrow depending on ID recommendations  DVT Prophylaxis: Prophylactic  Heparin   Code Status: Full code   Family Communication None at bedside  Procedures:  None  CONSULTS:  ID   MEDICATIONS: Scheduled Meds: . aspirin EC  81 mg Oral Daily  . atorvastatin  20 mg Oral Daily  . feeding supplement  1 Container Oral TID BM  . heparin subcutaneous  5,000 Units Subcutaneous Q8H  . saccharomyces boulardii  250 mg Oral BID   Continuous Infusions: .  sodium chloride 0.9 % 1,000 mL with potassium chloride 40 mEq infusion 20 mL/hr at 09/02/13 1240   PRN Meds:.acetaminophen, acetaminophen, barrier cream, loperamide, menthol-cetylpyridinium, MUSCLE RUB, ondansetron, oxyCODONE, sodium chloride, sodium chloride  Antibiotics: Anti-infectives   Start     Dose/Rate Route Frequency Ordered Stop   09/02/13 0000  sodium chloride 0.9 % SOLN 100 mL with DAPTOmycin 500 MG SOLR 749 mg     8 mg/kg  93.6 kg 230 mL/hr over 30 Minutes Intravenous Every 24 hours 09/02/13 1047     09/02/13 0000  dextrose 5 % SOLN 50 mL with cefTRIAXone 2 G SOLR 2 g     2 g 100 mL/hr over 30 Minutes Intravenous Every 24 hours 09/02/13 1047     08/29/13 2200  vancomycin (VANCOCIN) IVPB 1000 mg/200 mL premix  Status:  Discontinued     1,000 mg 200 mL/hr over 60 Minutes Intravenous Every 12 hours 08/29/13 2129 08/30/13 1705   08/29/13 2130  cefTRIAXone (ROCEPHIN) 2 g in dextrose 5 % 50 mL IVPB  Status:  Discontinued     2 g 100 mL/hr over 30 Minutes Intravenous Every 12 hours 08/29/13 2124 08/31/13 1538   08/29/13 2130  vancomycin (VANCOCIN) 1,250 mg in sodium chloride 0.9 % 250 mL IVPB  Status:  Discontinued     1,250 mg 166.7 mL/hr over 90 Minutes Intravenous Every 12 hours 08/29/13 2124 08/29/13 2127       PHYSICAL EXAM: Vital signs in last 24 hours: Filed Vitals:   09/01/13 0420 09/01/13 1455 09/01/13 2223 09/02/13 0644  BP: 137/65 134/72 138/76 157/76  Pulse: 89 70 90 76  Temp: 99 F (37.2 C) 99.6 F (37.6 C) 98.1 F (36.7 C) 99 F (37.2 C)  TempSrc: Oral Oral  Oral Oral  Resp: 18 16 16 16   Height: 5\' 9"  (1.753 m)     Weight: 92.7 kg (204 lb 5.9 oz)   93.622 kg (206 lb 6.4 oz)  SpO2: 95% 97% 98% 96%    Weight change: 0.923 kg (2 lb 0.5 oz) Filed Weights   08/31/13 0700 09/01/13 0420 09/02/13 0644  Weight: 96.3 kg (212 lb 4.9 oz) 92.7 kg (204 lb 5.9 oz) 93.622 kg (206 lb 6.4 oz)   Body mass index is 30.47 kg/(m^2).   Gen Exam: Awake and alert with  clear speech.   Neck: Supple, No JVD.   Chest: B/L Clear.   CVS: S1 S2 Regular, no murmurs.  Abdomen: soft, BS +, non tender, non distended.  Extremities: no edema, lower extremities warm to touch. Neurologic: Non Focal.   Skin: No Rash.   Wounds: N/A.   Intake/Output from previous day:  Intake/Output Summary (Last 24 hours) at 09/02/13 1250 Last data filed at 09/02/13 1123  Gross per 24 hour  Intake 566.67 ml  Output   2700 ml  Net -2133.33 ml     LAB RESULTS: CBC  Recent Labs Lab 08/29/13 1503 08/30/13 0640 08/31/13 0525  WBC 6.3 6.2 7.6  HGB 11.5* 11.1* 11.2*  HCT 32.0* 30.4* 30.8*  PLT 137* 136* 159  MCV 86.5 87.1 87.5  MCH 31.1 31.8 31.8  MCHC 35.9 36.5* 36.4*  RDW 12.6 12.9 12.9  LYMPHSABS 0.8 0.9 0.9  MONOABS 0.8 0.9 1.0  EOSABS 0.1 0.1 0.1  BASOSABS 0.1 0.1 0.0    Chemistries   Recent Labs Lab 08/29/13 1503 08/30/13 0640 08/31/13 0525 09/01/13 0530 09/02/13 0617  NA 140 140 139 139 139  K 2.8* 2.9* 3.3* 3.7 3.6  CL 104 106 107 107 106  CO2 25 25 23 22 22   GLUCOSE 105* 105* 129* 138* 124*  BUN 20 17 15 15 18   CREATININE 1.64* 1.69* 1.80* 1.89* 1.88*  CALCIUM 8.3* 8.0* 7.8* 7.9* 8.1*  MG  --  1.5  --   --   --     CBG: No results found for this basename: GLUCAP,  in the last 168 hours  GFR Estimated Creatinine Clearance: 43.7 ml/min (by C-G formula based on Cr of 1.88).  Coagulation profile No results found for this basename: INR, PROTIME,  in the last 168 hours  Cardiac Enzymes No results found for this basename: CK, CKMB, TROPONINI, MYOGLOBIN,  in the last 168 hours  No components found with this basename: POCBNP,  No results found for this basename: DDIMER,  in the last 72 hours No results found for this basename: HGBA1C,  in the last 72 hours No results found for this basename: CHOL, HDL, LDLCALC, TRIG, CHOLHDL, LDLDIRECT,  in the last 72 hours No results found for this basename: TSH, T4TOTAL, FREET3, T3FREE, THYROIDAB,  in the  last 72 hours No results found for this basename: VITAMINB12, FOLATE, FERRITIN, TIBC, IRON, RETICCTPCT,  in the last 72 hours No results found for this basename: LIPASE, AMYLASE,  in the last 72 hours  Urine Studies No results found for this basename: UACOL, UAPR, USPG, UPH, UTP, UGL, UKET, UBIL, UHGB, UNIT, UROB, ULEU, UEPI, UWBC, URBC, UBAC, CAST, CRYS, UCOM, BILUA,  in the last 72 hours  MICROBIOLOGY: Recent Results (from the past 240 hour(s))  CLOSTRIDIUM DIFFICILE BY PCR     Status: None   Collection Time    08/29/13  5:47 PM      Result Value Range  Status   C difficile by pcr NEGATIVE  NEGATIVE Final  CULTURE, BLOOD (ROUTINE X 2)     Status: None   Collection Time    08/29/13 10:12 PM      Result Value Range Status   Specimen Description BLOOD RIGHT ARM   Final   Special Requests BOTTLES DRAWN AEROBIC ONLY 5CC   Final   Culture  Setup Time     Final   Value: 08/30/2013 05:12     Performed at [facility]   Culture     Final   Value:        BLOOD CULTURE RECEIVED NO GROWTH TO DATE CULTURE WILL BE HELD FOR 5 DAYS BEFORE ISSUING A FINAL NEGATIVE REPORT     Performed at [facility]   Report Status PENDING   Incomplete  CULTURE, BLOOD (ROUTINE X 2)     Status: None   Collection Time    08/29/13 10:32 PM      Result Value Range Status   Specimen Description BLOOD LEFT HAND   Final   Special Requests BOTTLES DRAWN AEROBIC ONLY 5CC   Final   Culture  Setup Time     Final   Value: 08/30/2013 05:13     Performed at [facility]   Culture     Final   Value:        BLOOD CULTURE RECEIVED NO GROWTH TO DATE CULTURE WILL BE HELD FOR 5 DAYS BEFORE ISSUING A FINAL NEGATIVE REPORT     Performed at [facility]   Report Status PENDING   Incomplete  STOOL CULTURE     Status: None   Collection Time    08/30/13 11:12 AM      Result Value Range Status   Specimen Description STOOL   Final   Special Requests NONE   Final   Culture     Final   Value: NO  SUSPICIOUS COLONIES, CONTINUING TO HOLD     Performed at [facility]   Report Status PENDING   Incomplete  OVA AND PARASITE EXAMINATION     Status: None   Collection Time    08/30/13 11:12 AM      Result Value Range Status   Specimen Description STOOL   Final   Special Requests NONE   Final   Ova and parasites     Final   Value: NO OVA OR PARASITES SEEN ABUNDANT WBC SEEN     Performed at [facility]   Report Status 08/31/2013 FINAL   Final    RADIOLOGY STUDIES/RESULTS: Dg Orthopantogram  08/16/2013   *RADIOLOGY REPORT*  Clinical Data: Dental work several weeks ago on a left posterior mandibular tooth.  Evaluate for possible infection.  Prior history of endocarditis.  ORTHOPANTOGRAM/PANORAMIC  Comparison: None.  Findings: No periapical lucency involving any of the teeth to suggest abscess.  No intrinsic osseous abnormalities.  No visible dental caries.  Temporomandibular joints intact.  IMPRESSION: Normal examination.   Original Report Authenticated By: Hulan Saas, M.D.   Dg Chest 2 View  08/16/2013   *RADIOLOGY REPORT*  Clinical Data: Cough and chills.  CHEST - 2 VIEW  Comparison: None  Findings: The cardiac silhouette, mediastinal and hilar contours are within normal limits.  The lungs are clear.  No pleural effusion.  The bony thorax is intact.  IMPRESSION: No acute cardiopulmonary findings.   Original Report Authenticated By: Rudie Meyer, M.D.   Dg Knee 2 Views Left  08/29/2013   CLINICAL DATA:  Knee pain.  EXAM: LEFT KNEE - 1-2 VIEW  COMPARISON:  None  FINDINGS: Mild degenerative spurring along the tibial spines. The No acute bony abnormality. Specifically, no fracture, subluxation, or dislocation. Soft tissues are intact. No joint effusion.  IMPRESSION: No acute bony abnormality.   Electronically Signed   By: Charlett Nose M.D.   On: 08/29/2013 17:31    Jeoffrey Massed, MD  [facility] Pager:[phone]  If 7PM-7AM, please contact  night-coverage [email or web address] Password [email or web address] 09/02/2013, 12:50 PM   LOS: 4 days

## 2013-09-02 NOTE — Progress Notes (Signed)
   CARE MANAGEMENT NOTE 09/02/2013  Patient:  [facility]   Account Number:  [id number]  Date Initiated:  09/02/2013  Documentation initiated by:  [facility]  Subjective/Objective Assessment:   WUJ:[id number], dehydration     Action/Plan:   discharge planning   Anticipated DC Date:  09/03/2013   Anticipated DC Plan:  HOME W HOME HEALTH SERVICES      DC Planning Services  CM consult      [facility] Choice  HOME HEALTH   Choice offered to / List presented to:  C-1 Patient        HH arranged  HH-1 RN  IV Antibiotics      HH agency  [facility]   Status of service:  In process, will continue to follow Medicare Important Message given?   (If response is "NO", the following Medicare IM given date fields will be blank) Date Medicare IM given:   Date Additional Medicare IM given:    Discharge Disposition:    Per UR Regulation:    If discussed at Long Length of Stay Meetings, dates discussed:    Comments:  09/02/13 13:30 CM called [facility] to inform of probable pt discharge tomorrow.  Pt was receiving IV ABX from [facility] when he was admitted to hospital.  [facility] aware of probable discharge and CM will fax [facility] last dose run, and when first dose is to be run by [facility].  Waiting for Infectios Disease to determine ABX.  Will continue to follow for discharge. Please note pt's address to receive [facility] is NOT the [place] residence on the facesheet.  Address to deliver [facility] services will be [address], [place] [address].  Freddy Jaksch, BSN, CM [phone].

## 2013-09-03 LAB — BASIC METABOLIC PANEL
GFR calc Af Amer: 45 mL/min — ABNORMAL LOW (ref >90)
GFR calc non Af Amer: 39 mL/min — ABNORMAL LOW (ref >90)
Glucose, Bld: 123 mg/dL — ABNORMAL HIGH (ref 70–99)
Potassium: 3.3 mEq/L — ABNORMAL LOW (ref 3.5–5.1)
Sodium: 140 mEq/L (ref 135–145)

## 2013-09-03 LAB — STOOL CULTURE

## 2013-09-03 MED ORDER — HEPARIN SOD (PORK) LOCK FLUSH 100 UNIT/ML IV SOLN
250.0000 [IU] | INTRAVENOUS | Status: AC | PRN
  Administered 2013-09-03: 18:00:00 500 [IU]

## 2013-09-03 MED ORDER — ENOXAPARIN SODIUM 30 MG/0.3ML ~~LOC~~ SOLN
30.0000 mg | SUBCUTANEOUS | Status: DC
  Administered 2013-09-03: 30 mg via SUBCUTANEOUS
  Filled 2013-09-03: qty 0.3

## 2013-09-03 MED ORDER — SODIUM CHLORIDE 0.9 % IV SOLN
INTRAVENOUS | Status: DC
  Administered 2013-09-03: 10:00:00 via INTRAVENOUS
  Filled 2013-09-03 (×2): qty 1000

## 2013-09-03 MED ORDER — POTASSIUM CHLORIDE CRYS ER 20 MEQ PO TBCR
40.0000 meq | EXTENDED_RELEASE_TABLET | Freq: Once | ORAL | Status: AC
  Administered 2013-09-03: 08:00:00 40 meq via ORAL
  Filled 2013-09-03: qty 2

## 2013-09-03 MED ORDER — CIPROFLOXACIN HCL 500 MG PO TABS: 500.0000 mg | ORAL_TABLET | Freq: Two times a day (BID) | ORAL | Status: DC

## 2013-09-03 MED ORDER — SODIUM CHLORIDE 0.9 % IV SOLN: 750.0000 mg | INTRAVENOUS | Status: AC

## 2013-09-03 NOTE — Progress Notes (Addendum)
[facility] PROGRESS NOTE  Eric Chambers ZOX:[id number] DOB: Oct 12, 1946 DOA: 08/29/2013 PCP: [name], MD  Assessment/Plan:  Acute Kidney Failure  Etiology Diarrhea + ?Antibiotic related with supra therapeutic vancomycin level  Creatinine trending down slightly.  Holding nephrotoxic medications (ARB/HCTZ)  Renal ultrasound revealed no hydronephrosis and UA shows no proteinuria   Diarrhea  Appears to have returned after dapto / cipro was started ?Antibiotic related  Watery stools q2hrs over night.  C. difficile PCR negative, stool GI panel negative   Resume IVF, Florastor, prn Imodium  Addendum-claims no further diarrhea since am, is ambulating and wants to go home today. Will discharge home later today.  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 (blood cx negative)  Was tx with Vanc/Rocephin, d/c due to patient's request and intolerance  Currently treating with Cubicin/Cipro  Patient had previous episode of endocarditis in 2009  Hypokalemia  Secondary to diarrhea  Supplement IVF with Potassium with po K-Dur  Recheck BMP  Hypertension  Holding Micardis  BP increasing slightly, but remaining undercontrol  OSA  Does not wear CPAP  Left Knee Pain  Xray negative for structural abnormality or fx  PT consulted, 08/31/13  Crista Elliot, ambulation, supportive care  Oxycodone, prn for pain  Code Status: FULL Family Communication:  Disposition Plan: Inpatient.  Discharge to home when appropriate.   Consultants:  ID  Procedures:  Renal Ultrasound, 09/01/13  Antibiotics:  Vancomycin: started 08/29/13, stopped 08/30/13  Ceftriaxone: started 08/29/13, stopped 08/31/13  Cubicin: started 09/02/13  Ciprofloxacin: started 09/02/13  HPI/Subjective: Patient reports diarrhea that returned early this morning and occurs every 2 hours. He denies  abdominal pain and vomiting.   Objective: Filed Vitals:   09/03/13 0420  BP: 151/80  Pulse: 80  Temp: 98.4 F (36.9 C)  Resp: 18    Intake/Output Summary (Last 24 hours) at 09/03/13 0928 Last data filed at 09/03/13 0818  Gross per 24 hour  Intake 928.83 ml  Output   1850 ml  Net -921.17 ml   Filed Weights   09/01/13 0420 09/02/13 0644 09/02/13 2138  Weight: 92.7 kg (204 lb 5.9 oz) 93.622 kg (206 lb 6.4 oz) 95.255 kg (210 lb)    Exam:   General:  Well-developed, well-nourished male, in no acute distress  Cardiovascular: RRR, murmur heard, no gallop or rub  Respiratory: CTA bilaterally, no rhonchi, rales, or wheezing  Abdomen: soft, non-distended, bowel sounds present, non-tender to palpation  Musculoskeletal: no edema, limited ROM left knee  Data Reviewed: Basic Metabolic Panel:  Recent Labs Lab 08/29/13 1503 08/30/13 0640 08/31/13 0525 09/01/13 0530 09/02/13 0617 09/03/13 0410  NA 140 140 139 139 139 140  K 2.8* 2.9* 3.3* 3.7 3.6 3.3*  CL 104 106 107 107 106 103  CO2 25 25 23 22 22 23   GLUCOSE 105* 105* 129* 138* 124* 123*  BUN 20 17 15 15 18 19   CREATININE 1.64* 1.69* 1.80* 1.89* 1.88* 1.75*  CALCIUM 8.3* 8.0* 7.8* 7.9* 8.1* 8.3*  MG  --  1.5  --   --   --   --   PHOS  --   --   --   --  3.0  --    Liver Function Tests:  Recent Labs Lab 08/29/13 1503 08/30/13 0640 09/02/13 0617  AST 18 16  --   ALT 29 26  --   ALKPHOS 35* 32*  --   BILITOT  0.4 0.3  --   PROT 5.5* 5.2*  --   ALBUMIN 2.5* 2.3* 2.3*   CBC:  Recent Labs Lab 08/29/13 1503 08/30/13 0640 08/31/13 0525  WBC 6.3 6.2 7.6  NEUTROABS 4.5 4.2 5.6  HGB 11.5* 11.1* 11.2*  HCT 32.0* 30.4* 30.8*  MCV 86.5 87.1 87.5  PLT 137* 136* 159   Cardiac Enzymes:  Recent Labs Lab 09/02/13 1614  CKTOTAL 88    Recent Results (from the past 240 hour(s))  CLOSTRIDIUM DIFFICILE BY PCR     Status: None   Collection Time    08/29/13  5:47 PM      Result Value Range Status   C  difficile by pcr NEGATIVE  NEGATIVE Final  CULTURE, BLOOD (ROUTINE X 2)     Status: None   Collection Time    08/29/13 10:12 PM      Result Value Range Status   Specimen Description BLOOD RIGHT ARM   Final   Special Requests BOTTLES DRAWN AEROBIC ONLY 5CC   Final   Culture  Setup Time     Final   Value: 08/30/2013 05:12     Performed at [facility]   Culture     Final   Value:        BLOOD CULTURE RECEIVED NO GROWTH TO DATE CULTURE WILL BE HELD FOR 5 DAYS BEFORE ISSUING A FINAL NEGATIVE REPORT     Performed at [facility]   Report Status PENDING   Incomplete  CULTURE, BLOOD (ROUTINE X 2)     Status: None   Collection Time    08/29/13 10:32 PM      Result Value Range Status   Specimen Description BLOOD LEFT HAND   Final   Special Requests BOTTLES DRAWN AEROBIC ONLY 5CC   Final   Culture  Setup Time     Final   Value: 08/30/2013 05:13     Performed at [facility]   Culture     Final   Value:        BLOOD CULTURE RECEIVED NO GROWTH TO DATE CULTURE WILL BE HELD FOR 5 DAYS BEFORE ISSUING A FINAL NEGATIVE REPORT     Performed at [facility]   Report Status PENDING   Incomplete  STOOL CULTURE     Status: None   Collection Time    08/30/13 11:12 AM      Result Value Range Status   Specimen Description STOOL   Final   Special Requests NONE   Final   Culture     Final   Value: NO SUSPICIOUS COLONIES, CONTINUING TO HOLD     Performed at [facility]   Report Status PENDING   Incomplete  OVA AND PARASITE EXAMINATION     Status: None   Collection Time    08/30/13 11:12 AM      Result Value Range Status   Specimen Description STOOL   Final   Special Requests NONE   Final   Ova and parasites     Final   Value: NO OVA OR PARASITES SEEN ABUNDANT WBC SEEN     Performed at [facility]   Report Status 08/31/2013 FINAL   Final     Studies: US Renal  09/01/2013   CLINICAL DATA:  67 year old male with renal failure.  EXAM: RENAL/URINARY  TRACT ULTRASOUND COMPLETE  COMPARISON:  None.  FINDINGS: Right Kidney: No hydronephrosis. Renal length 12.5 cm. Echotexture within normal limits.  Left  Kidney: No hydronephrosis. Renal length 13.4 cm. Cortical echotexture within normal limits. Small exophytic hypoechoic probable simple cysts measuring up to 13 mm laterally.  Bladder:  Appears normal for degree of bladder distention.  IMPRESSION: No acute renal findings.  Small simple appearing left renal cyst.   Electronically Signed   By: Augusto Gamble M.D.   On: 09/01/2013 12:58    Scheduled Meds: . amLODipine  5 mg Oral Daily  . aspirin EC  81 mg Oral Daily  . ciprofloxacin  500 mg Oral Q12H  . DAPTOmycin (CUBICIN)  IV  750 mg Intravenous Q24H  . enoxaparin (LOVENOX) injection  30 mg Subcutaneous Q24H  . feeding supplement  1 Container Oral TID BM  . saccharomyces boulardii  250 mg Oral BID   Continuous Infusions: . sodium chloride 0.9 % 1,000 mL with potassium chloride 20 mEq infusion      Active Problems:   Hypertension   Endocarditis   OSA (obstructive sleep apnea)   Hypokalemia   Anemia due to acute blood loss   Diarrhea      DENNIN, SARA PA-S Algis Downs, PA-C [facility] Pager [phone]. If 7PM-7AM, please contact night-coverage at [email or web address], password [facility] 09/03/2013, 9:28 AM  LOS: 5 days    Attending Patient seen and examined, agree with the assessment and plan as outlined above. Diarrhea recurred-?related to starting antibiotics. Await ID input. Renal function better.   Windell Norfolk MD

## 2013-09-03 NOTE — Progress Notes (Signed)
INFECTIOUS DISEASE PROGRESS NOTE  ID: Eric Chambers is a 67 y.o. male with  Active Problems:   Hypertension   Endocarditis   OSA (obstructive sleep apnea)   Hypokalemia   Anemia due to acute blood loss   Diarrhea   Vomiting  Subjective: Feels better- has walked with PT. Denies SOB (states his only problem is OSA). Having more formed and less frequent BM.   Abtx:  Anti-infectives   Start     Dose/Rate Route Frequency Ordered Stop   09/02/13 1600  DAPTOmycin (CUBICIN) 750 mg in sodium chloride 0.9 % IVPB     750 mg 230 mL/hr over 30 Minutes Intravenous Every 24 hours 09/02/13 1543     09/02/13 1600  ciprofloxacin (CIPRO) tablet 500 mg     500 mg Oral Every 12 hours 09/02/13 1558     09/02/13 0000  sodium chloride 0.9 % SOLN 100 mL with DAPTOmycin 500 MG SOLR 749 mg     8 mg/kg  93.6 kg 230 mL/hr over 30 Minutes Intravenous Every 24 hours 09/02/13 1047     09/02/13 0000  dextrose 5 % SOLN 50 mL with cefTRIAXone 2 G SOLR 2 g  Status:  Discontinued     2 g 100 mL/hr over 30 Minutes Intravenous Every 24 hours 09/02/13 1047 09/02/13    08/29/13 2200  vancomycin (VANCOCIN) IVPB 1000 mg/200 mL premix  Status:  Discontinued     1,000 mg 200 mL/hr over 60 Minutes Intravenous Every 12 hours 08/29/13 2129 08/30/13 1705   08/29/13 2130  cefTRIAXone (ROCEPHIN) 2 g in dextrose 5 % 50 mL IVPB  Status:  Discontinued     2 g 100 mL/hr over 30 Minutes Intravenous Every 12 hours 08/29/13 2124 08/31/13 1538   08/29/13 2130  vancomycin (VANCOCIN) 1,250 mg in sodium chloride 0.9 % 250 mL IVPB  Status:  Discontinued     1,250 mg 166.7 mL/hr over 90 Minutes Intravenous Every 12 hours 08/29/13 2124 08/29/13 2127      Medications:  Scheduled: . amLODipine  5 mg Oral Daily  . aspirin EC  81 mg Oral Daily  . ciprofloxacin  500 mg Oral Q12H  . DAPTOmycin (CUBICIN)  IV  750 mg Intravenous Q24H  . enoxaparin (LOVENOX) injection  30 mg Subcutaneous Q24H  . feeding supplement  1 Container Oral TID  BM  . saccharomyces boulardii  250 mg Oral BID    Objective: Vital signs in last 24 hours: Temp:  [97.9 F (36.6 C)-98.4 F (36.9 C)] 98 F (36.7 C) (09/15 1418) Pulse Rate:  [77-86] 86 (09/15 1418) Resp:  [18-20] 20 (09/15 1418) BP: (136-168)/(67-80) 136/76 mmHg (09/15 1418) SpO2:  [97 %-98 %] 97 % (09/15 1418) Weight:  [95.255 kg (210 lb)] 95.255 kg (210 lb) (09/14 2138)   General appearance: alert and no distress Resp: clear to auscultation bilaterally Cardio: regular rate and rhythm GI: normal findings: bowel sounds normal and soft, non-tender  Lab Results  Recent Labs  09/02/13 0617 09/03/13 0410  NA 139 140  K 3.6 3.3*  CL 106 103  CO2 22 23  BUN 18 19  CREATININE 1.88* 1.75*   Liver Panel  Recent Labs  09/02/13 0617  ALBUMIN 2.3*   Sedimentation Rate No results found for this basename: ESRSEDRATE,  in the last 72 hours C-Reactive Protein No results found for this basename: CRP,  in the last 72 hours  Microbiology: Recent Results (from the past 240 hour(s))  CLOSTRIDIUM DIFFICILE BY PCR  Status: None   Collection Time    08/29/13  5:47 PM      Result Value Range Status   C difficile by pcr NEGATIVE  NEGATIVE Final  CULTURE, BLOOD (ROUTINE X 2)     Status: None   Collection Time    08/29/13 10:12 PM      Result Value Range Status   Specimen Description BLOOD RIGHT ARM   Final   Special Requests BOTTLES DRAWN AEROBIC ONLY 5CC   Final   Culture  Setup Time     Final   Value: 08/30/2013 05:12     Performed at [facility]   Culture     Final   Value:        BLOOD CULTURE RECEIVED NO GROWTH TO DATE CULTURE WILL BE HELD FOR 5 DAYS BEFORE ISSUING A FINAL NEGATIVE REPORT     Performed at [facility]   Report Status PENDING   Incomplete  CULTURE, BLOOD (ROUTINE X 2)     Status: None   Collection Time    08/29/13 10:32 PM      Result Value Range Status   Specimen Description BLOOD LEFT HAND   Final   Special Requests BOTTLES  DRAWN AEROBIC ONLY 5CC   Final   Culture  Setup Time     Final   Value: 08/30/2013 05:13     Performed at [facility]   Culture     Final   Value:        BLOOD CULTURE RECEIVED NO GROWTH TO DATE CULTURE WILL BE HELD FOR 5 DAYS BEFORE ISSUING A FINAL NEGATIVE REPORT     Performed at [facility]   Report Status PENDING   Incomplete  STOOL CULTURE     Status: None   Collection Time    08/30/13 11:12 AM      Result Value Range Status   Specimen Description STOOL   Final   Special Requests NONE   Final   Culture     Final   Value: NO SALMONELLA, SHIGELLA, CAMPYLOBACTER, YERSINIA, OR E.COLI 0157:H7 ISOLATED     Performed at [facility]   Report Status 09/03/2013 FINAL   Final  OVA AND PARASITE EXAMINATION     Status: None   Collection Time    08/30/13 11:12 AM      Result Value Range Status   Specimen Description STOOL   Final   Special Requests NONE   Final   Ova and parasites     Final   Value: NO OVA OR PARASITES SEEN ABUNDANT WBC SEEN     Performed at [facility]   Report Status 08/31/2013 FINAL   Final    Studies/Results: No results found.   Assessment/Plan: Cx negative endocarditis (presumded) ARF Diarrhea  Total days of antibiotics: 18 (cubicin/cipro)  I spoke with a friend of family (with their permission) that he has worsened SOB and diarrhea. Pt denies these things currently. He states he feels better and has no nausea.  If he truly is better, would agree with d/c planning. Would plan for close f/u.  He has Hacek serologies pending. Will add fungal panel as well.          Eric Chambers Infectious Diseases (pager) [phone] [email or web address] 09/03/2013, 2:46 PM  LOS: 5 days

## 2013-09-03 NOTE — Evaluation (Signed)
Occupational Therapy Evaluation Patient Details Name: Eric Chambers MRN: [id number] DOB: Mar 25, 1946 Today's Date: 09/03/2013 Time: 1005-1020 OT Time Calculation (min): 15 min  OT Assessment / Plan / Recommendation History of present illness Patient is 67 yo male admitted with nausea/diarrhea/vomiting from antibiotics for endocarditis.  Patient recently "twisted" Lt knee and heard  a pop.  Patient now with pain and decreased ROM in Lt knee.  Xray negative.   Clinical Impression   Pt presents at an independent to modified independent level in ADL and mobility.  Continues to have pain in L knee, may be worth orthopedic evaluation.    OT Assessment  Patient does not need any further OT services    Follow Up Recommendations  No OT follow up    Barriers to Discharge      Equipment Recommendations  None recommended by OT    Recommendations for Other Services    Frequency       Precautions / Restrictions Precautions Precautions: None Restrictions Weight Bearing Restrictions: No   Pertinent Vitals/Pain VSS, soreness in L knee    ADL  Eating/Feeding: Independent Where Assessed - Eating/Feeding: Edge of bed Grooming: Wash/dry hands;Modified independent Where Assessed - Grooming: Unsupported standing Upper Body Bathing: Independent Where Assessed - Upper Body Bathing: Unsupported standing Lower Body Bathing: Independent Where Assessed - Lower Body Bathing: Unsupported standing Upper Body Dressing: Independent Where Assessed - Upper Body Dressing: Unsupported sitting Lower Body Dressing: Independent Where Assessed - Lower Body Dressing: Unsupported sitting;Supported sit to stand Toilet Transfer: Modified independent Toilet Transfer Method: Sit to Barista: Regular height toilet Toileting - Clothing Manipulation and Hygiene: Independent Where Assessed - Toileting Clothing Manipulation and Hygiene: Standing Transfers/Ambulation Related to ADLs: Pt held  sink, furniture as he ambulated due to sore knee.    OT Diagnosis:    OT Problem List:   OT Treatment Interventions:     OT Goals(Current goals can be found in the care plan section) Acute Rehab OT Goals Patient Stated Goal: Stop diarrhea  Visit Information  Last OT Received On: 09/03/13 Assistance Needed: +1 History of Present Illness: Patient is 67 yo male admitted with nausea/diarrhea/vomiting from antibiotics for endocarditis.  Patient recently "twisted" Lt knee and heard  a pop.  Patient now with pain and decreased ROM in Lt knee.  Xray negative.       Prior Functioning     Home Living Family/patient expects to be discharged to:: Private residence Living Arrangements: Spouse/significant other;Parent Available Help at Discharge: Family;Available 24 hours/day Type of Home: House Home Access: Stairs to enter [employer] of Steps: 3 Entrance Stairs-Rails: Right;Left Home Layout: One level Home Equipment: None Prior Function Level of Independence: Independent Communication Communication: No difficulties Dominant Hand: Right         Vision/Perception Vision - History Baseline Vision: Wears glasses all the time   Cognition  Cognition Arousal/Alertness: Awake/alert Behavior During Therapy: WFL for tasks assessed/performed Overall Cognitive Status: Within Functional Limits for tasks assessed    Extremity/Trunk Assessment Upper Extremity Assessment Upper Extremity Assessment: Overall WFL for tasks assessed Lower Extremity Assessment Lower Extremity Assessment: Overall WFL for tasks assessed LLE Deficits / Details: soreness in L knee Cervical / Trunk Assessment Cervical / Trunk Assessment: Normal     Mobility Bed Mobility Bed Mobility: Supine to Sit;Sit to Supine Supine to Sit: 7: Independent Sit to Supine: 7: Independent Transfers Sit to Stand: 6: Modified independent (Device/Increase time);With upper extremity assist;From bed Stand to Sit: 6:  Modified independent (Device/Increase  time);With upper extremity assist;To bed     Exercise     Balance     End of Session OT - End of Session Activity Tolerance: Patient tolerated treatment well Patient left: in bed;with call bell/phone within reach  GO     Evern Bio 09/03/2013, 10:20 AM [id number]

## 2013-09-03 NOTE — Discharge Summary (Signed)
Discharge summary per MD reviewed with pt and his wife.  All questions answered and pt has no complaints. Medications reviewed and prescriptions given. Will follow up with MD

## 2013-09-05 LAB — CULTURE, BLOOD (ROUTINE X 2): Culture: NO GROWTH

## 2013-09-06 ENCOUNTER — Ambulatory Visit (INDEPENDENT_AMBULATORY_CARE_PROVIDER_SITE_OTHER): Payer: Medicare Other | Admitting: Infectious Disease

## 2013-09-06 ENCOUNTER — Encounter: Payer: Self-pay | Admitting: Infectious Disease

## 2013-09-06 VITALS — BP 124/75 | HR 87 | Temp 98.5°F | Ht 69.0 in | Wt 200.8 lb

## 2013-09-06 DIAGNOSIS — I38 Endocarditis, valve unspecified: Secondary | ICD-10-CM | POA: Diagnosis not present

## 2013-09-06 DIAGNOSIS — R197 Diarrhea, unspecified: Secondary | ICD-10-CM | POA: Diagnosis not present

## 2013-09-06 NOTE — Progress Notes (Signed)
  Subjective:    Patient ID: Eric Chambers, male    DOB: 1946-10-28, 67 y.o.   MRN: [id number]  HPI  67 year old man with prior history of endocarditis treated in [place],  (by hx seems likely to have been strep species since he was treated with PCN) admitted to [facility] and found to have strandlike object on TV concerning for endocarditis. Blood cultures were negative. He was sent hom on Rocephin and vancomycin but did not tolerate these with diarrhea, sweats. He was brought back in ruled out for CDI. Repeat blood cultures, changed to high dose daptomycin 8mg /kg plus cipro. Legionella abs, Chlamydia abs negative. Coxiella and Bartonella abs pending.    Review of Systems  Constitutional: Negative for fever, chills, diaphoresis, activity change, appetite change, fatigue and unexpected weight change.  HENT: Negative for congestion, sore throat, rhinorrhea, sneezing, trouble swallowing and sinus pressure.   Eyes: Negative for photophobia and visual disturbance.  Respiratory: Negative for cough, chest tightness, shortness of breath, wheezing and stridor.   Cardiovascular: Negative for chest pain, palpitations and leg swelling.  Gastrointestinal: Negative for nausea, vomiting, abdominal pain, diarrhea, constipation, blood in stool, abdominal distention and anal bleeding.  Genitourinary: Negative for dysuria, hematuria, flank pain and difficulty urinating.  Musculoskeletal: Negative for myalgias, back pain, joint swelling, arthralgias and gait problem.  Skin: Negative for color change, pallor, rash and wound.  Neurological: Negative for dizziness, tremors, weakness and light-headedness.  Hematological: Negative for adenopathy. Does not bruise/bleed easily.  Psychiatric/Behavioral: Negative for behavioral problems, confusion, sleep disturbance, dysphoric mood, decreased concentration and agitation.       Objective:   Physical Exam  Constitutional: He is oriented to person, place, and time. He  appears well-developed and well-nourished. No distress.  HENT:  Head: Normocephalic and atraumatic.  Mouth/Throat: Oropharynx is clear and moist. No oropharyngeal exudate.  Eyes: Conjunctivae and EOM are normal. Pupils are equal, round, and reactive to light. No scleral icterus.  Neck: Normal range of motion. Neck supple. No JVD present.  Cardiovascular: Normal rate, regular rhythm and normal heart sounds.   No murmur heard. Pulmonary/Chest: Effort normal and breath sounds normal. No respiratory distress. He has no wheezes. He has no rales. He exhibits no tenderness.  Abdominal: He exhibits no distension and no mass. There is no tenderness. There is no rebound and no guarding.  Musculoskeletal: He exhibits no edema and no tenderness.  Lymphadenopathy:    He has no cervical adenopathy.  Neurological: He is alert and oriented to person, place, and time. He exhibits normal muscle tone. Coordination normal.  Skin: Skin is warm and dry. He is not diaphoretic. No erythema. No pallor.     Psychiatric: He has a normal mood and affect. His behavior is normal. Judgment and thought content normal.          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 difficile colitis.

## 2013-09-07 LAB — Q FEVER ABS IGG, IGM W/ REFLEX TITER
Q fever IgG phase I screen 1: NEGATIVE
Q fever IgM phase II screen 1: NEGATIVE

## 2013-09-08 LAB — FUNGAL ANTIBODIES PANEL, ID-BLOOD
Aspergillus Flavus Antibodies: NEGATIVE
Aspergillus Niger Antibodies: NEGATIVE
Aspergillus fumigatus: NEGATIVE

## 2013-09-09 LAB — CULTURE, BLOOD (ROUTINE X 2)
Culture: NO GROWTH
Culture: NO GROWTH

## 2013-09-10 DIAGNOSIS — I359 Nonrheumatic aortic valve disorder, unspecified: Secondary | ICD-10-CM | POA: Diagnosis not present

## 2013-09-10 LAB — MISCELLANEOUS TEST

## 2013-09-18 ENCOUNTER — Encounter: Payer: Self-pay | Admitting: Infectious Diseases

## 2013-09-18 DIAGNOSIS — M25569 Pain in unspecified knee: Secondary | ICD-10-CM | POA: Diagnosis not present

## 2013-09-18 DIAGNOSIS — IMO0002 Reserved for concepts with insufficient information to code with codable children: Secondary | ICD-10-CM | POA: Diagnosis not present

## 2013-09-24 ENCOUNTER — Telehealth: Payer: Self-pay | Admitting: *Deleted

## 2013-09-24 DIAGNOSIS — I359 Nonrheumatic aortic valve disorder, unspecified: Secondary | ICD-10-CM | POA: Diagnosis not present

## 2013-09-24 NOTE — Telephone Encounter (Signed)
Pt's [facility] RN called to let us know pt is running out of his oral antibiotic and wants to know if he should continue.  RN left pt a message asking him to please call back with the name of the medication and start date. Pt was supposed to have 6 weeks of oral ciprofloxacin.

## 2013-09-25 ENCOUNTER — Telehealth: Payer: Self-pay | Admitting: *Deleted

## 2013-09-25 DIAGNOSIS — E876 Hypokalemia: Secondary | ICD-10-CM | POA: Diagnosis not present

## 2013-09-25 DIAGNOSIS — N179 Acute kidney failure, unspecified: Secondary | ICD-10-CM | POA: Diagnosis not present

## 2013-09-25 DIAGNOSIS — I38 Endocarditis, valve unspecified: Secondary | ICD-10-CM

## 2013-09-25 MED ORDER — CIPROFLOXACIN HCL 500 MG PO TABS: 500.0000 mg | ORAL_TABLET | Freq: Two times a day (BID) | ORAL | Status: DC

## 2013-09-26 ENCOUNTER — Telehealth: Payer: Self-pay | Admitting: *Deleted

## 2013-09-27 NOTE — Telephone Encounter (Signed)
error 

## 2013-10-01 DIAGNOSIS — I359 Nonrheumatic aortic valve disorder, unspecified: Secondary | ICD-10-CM | POA: Diagnosis not present

## 2013-10-05 ENCOUNTER — Other Ambulatory Visit (HOSPITAL_COMMUNITY): Payer: Self-pay | Admitting: Cardiology

## 2013-10-05 ENCOUNTER — Ambulatory Visit (HOSPITAL_COMMUNITY): Payer: Medicare Other | Attending: Internal Medicine | Admitting: Cardiology

## 2013-10-05 DIAGNOSIS — I339 Acute and subacute endocarditis, unspecified: Secondary | ICD-10-CM | POA: Diagnosis not present

## 2013-10-05 NOTE — Progress Notes (Signed)
Limited echo performed. 

## 2013-10-06 ENCOUNTER — Encounter: Payer: Self-pay | Admitting: Cardiology

## 2013-10-07 NOTE — Progress Notes (Signed)
THanks for repeat echo Loraine Leriche!

## 2013-10-08 ENCOUNTER — Ambulatory Visit (INDEPENDENT_AMBULATORY_CARE_PROVIDER_SITE_OTHER): Payer: Medicare Other | Admitting: Infectious Disease

## 2013-10-08 ENCOUNTER — Encounter: Payer: Self-pay | Admitting: Infectious Disease

## 2013-10-08 VITALS — BP 126/75 | HR 73 | Temp 98.3°F | Wt 199.8 lb

## 2013-10-08 DIAGNOSIS — I38 Endocarditis, valve unspecified: Secondary | ICD-10-CM

## 2013-10-08 DIAGNOSIS — L989 Disorder of the skin and subcutaneous tissue, unspecified: Secondary | ICD-10-CM | POA: Diagnosis not present

## 2013-10-08 DIAGNOSIS — N179 Acute kidney failure, unspecified: Secondary | ICD-10-CM

## 2013-10-08 DIAGNOSIS — I359 Nonrheumatic aortic valve disorder, unspecified: Secondary | ICD-10-CM | POA: Diagnosis not present

## 2013-10-08 NOTE — Progress Notes (Signed)
Subjective:    Patient ID: Eric Chambers, male    DOB: 12/02/1946, 67 y.o.   MRN: [id number]  HPI   67 year old man with prior history of endocarditis treated in [place],  (by hx seems likely to have been strep species since he was treated with PCN) admitted to [facility] and found to have strandlike object on TV concerning for endocarditis. Blood cultures were negative. He was sent hom on Rocephin and vancomycin but did not tolerate these with diarrhea, sweats. He was brought back in ruled out for CDI. Repeat blood cultures, changed to high dose daptomycin 8mg/kg plus cipro. Legionella abs, Chlamydia abs negative except for Chlamydia pneuonaie IgG at 1:64 Coxiella negative,  and Bartonella  Not done.  He has had repeat 2-D echocardiogram which shows persistent but stable  " hypermobile string like structures noted noted on the tricuspid chordae that could represent elongation of the chordae vs thrombus vs ?vegetation. Not significantly different than on recent TEE."  He is without fevers chills nausea or malaise. He had noted some problems with painful sensation in his fingertips with some desquamation of his fingertips skin that occurred with this episode of endocarditis and with his prior episode in [place].      Review of Systems  Constitutional: Negative for fever, chills, diaphoresis, activity change, appetite change, fatigue and unexpected weight change.  HENT: Negative for congestion, rhinorrhea, sinus pressure, sneezing, sore throat and trouble swallowing.   Eyes: Negative for photophobia and visual disturbance.  Respiratory: Negative for cough, chest tightness, shortness of breath, wheezing and stridor.   Cardiovascular: Negative for chest pain, palpitations and leg swelling.  Gastrointestinal: Negative for nausea, vomiting, abdominal pain, diarrhea, constipation, blood in stool, abdominal distention and anal bleeding.  Genitourinary: Negative for dysuria, hematuria, flank  pain and difficulty urinating.  Musculoskeletal: Negative for arthralgias, back pain, gait problem, joint swelling and myalgias.  Skin: Negative for color change, pallor, rash and wound.  Neurological: Negative for dizziness, tremors, weakness and light-headedness.  Hematological: Negative for adenopathy. Does not bruise/bleed easily.  Psychiatric/Behavioral: Negative for behavioral problems, confusion, sleep disturbance, dysphoric mood, decreased concentration and agitation.       Objective:   Physical Exam  Constitutional: He is oriented to person, place, and time. He appears well-developed and well-nourished. No distress.  HENT:  Head: Normocephalic and atraumatic.  Mouth/Throat: Oropharynx is clear and moist. No oropharyngeal exudate.  Eyes: Conjunctivae and EOM are normal. Pupils are equal, round, and reactive to light. No scleral icterus.  Neck: Normal range of motion. Neck supple. No JVD present.  Cardiovascular: Normal rate, regular rhythm and normal heart sounds.   No murmur heard. Pulmonary/Chest: Effort normal and breath sounds normal. No respiratory distress. He has no wheezes. He has no rales. He exhibits no tenderness.  Abdominal: He exhibits no distension and no mass. There is no tenderness. There is no rebound and no guarding.  Musculoskeletal: He exhibits no edema and no tenderness.  Lymphadenopathy:    He has no cervical adenopathy.  Neurological: He is alert and oriented to person, place, and time. He exhibits normal muscle tone. Coordination normal.  Skin: Skin is warm and dry. He is not diaphoretic. No erythema. No pallor.     Psychiatric: He has a normal mood and affect. His behavior is normal. Judgment and thought content normal.          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  AKI: resolving 

## 2013-10-09 ENCOUNTER — Encounter: Payer: Self-pay | Admitting: Cardiology

## 2013-10-09 ENCOUNTER — Ambulatory Visit (INDEPENDENT_AMBULATORY_CARE_PROVIDER_SITE_OTHER): Payer: Medicare Other | Admitting: Cardiology

## 2013-10-09 VITALS — BP 124/60 | HR 80 | Ht 68.0 in | Wt 200.0 lb

## 2013-10-09 DIAGNOSIS — I38 Endocarditis, valve unspecified: Secondary | ICD-10-CM

## 2013-10-09 DIAGNOSIS — I10 Essential (primary) hypertension: Secondary | ICD-10-CM | POA: Diagnosis not present

## 2013-10-09 DIAGNOSIS — E7800 Pure hypercholesterolemia, unspecified: Secondary | ICD-10-CM | POA: Diagnosis not present

## 2013-10-09 DIAGNOSIS — I351 Nonrheumatic aortic (valve) insufficiency: Secondary | ICD-10-CM

## 2013-10-09 DIAGNOSIS — I359 Nonrheumatic aortic valve disorder, unspecified: Secondary | ICD-10-CM | POA: Diagnosis not present

## 2013-10-09 DIAGNOSIS — G4733 Obstructive sleep apnea (adult) (pediatric): Secondary | ICD-10-CM

## 2013-10-09 LAB — LIPID PANEL
Cholesterol: 245 mg/dL — ABNORMAL HIGH (ref 0–200)
HDL: 37.7 mg/dL — ABNORMAL LOW
Total CHOL/HDL Ratio: 6
Triglycerides: 307 mg/dL — ABNORMAL HIGH (ref 0.0–149.0)
VLDL: 61.4 mg/dL — ABNORMAL HIGH (ref 0.0–40.0)

## 2013-10-09 LAB — LDL CHOLESTEROL, DIRECT: Direct LDL: 161 mg/dL

## 2013-10-09 NOTE — Patient Instructions (Signed)
Your physician recommends that you have labs today: Lipid  Your physician recommends that you continue on your current medications as directed. Please refer to the Current Medication list given to you today.  Your physician wants you to follow-up in: 6 months with Dr. Algis Liming will receive a reminder letter in the mail two months in advance. If you don't receive a letter, please call our office to schedule the follow-up appointment.

## 2013-10-09 NOTE — Progress Notes (Signed)
[address] [place], [place]  [address] Phone: [phone] Fax:  [phone]  Date:  10/09/2013   ID:  Eric Chambers, DOB [birth year], MRN [id number]  PCP:   Duane Lope, MD   History of Present Illness: Eric Chambers is a 67 y.o. male with history of endocarditis, previously in [place], with recent admission August 2014 for presumed endocarditis. Transesophageal echocardiogram was performed which demonstrated fingerlike projections surrounding the tricuspid valve, possible vegetation. Repeat limited echocardiogram was performed in October of 2014 which demonstrated similar findings.   I reviewed infectious disease notes that he has completed a course of IV antibiotics and is continuing on PO Cipro. Daptomycin. Had reaction to Vanco and ceftraxone.   ?cracking of hands manifestation of endocarditis.   He stopped Lipitor- he felt muscle cramping. No weakness. No longer sore feet.     Wt Readings from Last 3 Encounters:  10/09/13 200 lb (90.719 kg)  10/08/13 199 lb 12 oz (90.606 kg)  09/06/13 200 lb 12 oz (91.06 kg)     Past Medical History  Diagnosis Date  . Endocarditis   . High cholesterol   . Hypertension   . Enlarged prostate   . Heart murmur     "think I have one now" (08/16/2013)  . Pneumonia     "almost once/yr" (08/16/2013)  . Sleep apnea     "don't wear mask" (08/16/2013)  . Aortic valve regurgitation   . Endocarditis     Possible vegetation associated with tricuspid valve (seen on post tx echo as well)     Past Surgical History  Procedure Laterality Date  . Tonsillectomy  ~ 1957  . Inguinal hernia repair Right ~ 1950  . Umbilical hernia repair  ~ 2011  . Tee without cardioversion N/A 08/17/2013    Procedure: TRANSESOPHAGEAL ECHOCARDIOGRAM (TEE);  Surgeon: Donato Schultz, MD;  Location: [facility] ENDOSCOPY;  Service: Cardiovascular;  Laterality: N/A;    Current Outpatient Prescriptions  Medication Sig Dispense Refill  . amLODipine (NORVASC)  5 MG tablet Take 5 mg by mouth daily.      . ciprofloxacin (CIPRO) 500 MG/5ML (10%) suspension Take by mouth 2 (two) times daily.      . sodium chloride 0.9 % SOLN 100 mL with DAPTOmycin 500 MG SOLR Inject into the vein daily.      Marland Kitchen atorvastatin (LIPITOR) 20 MG tablet Take 20 mg by mouth daily.       No current facility-administered medications for this visit.    Allergies:   No Known Allergies  Social History:  The patient  reports that he has never smoked. He has never used smokeless tobacco. He reports that he drinks about 1.2 ounces of alcohol per week. He reports that he does not use illicit drugs.   ROS:  Please see the history of present illness.   Denies any recent fevers, chills, orthopnea, PND, chest pain.   All other systems reviewed and negative.   PHYSICAL EXAM: VS:  BP 124/60  Pulse 80  Ht 5\' 8"  (1.727 m)  Wt 200 lb (90.719 kg)  BMI 30.42 kg/m2 Well nourished, well developed, in no acute distress HEENT: normal Neck: no JVD Cardiac:  normal S1, S2; RRR; no murmur Lungs:  clear to auscultation bilaterally, no wheezing, rhonchi or rales Abd: soft, nontender, no hepatomegaly Ext: no edema Skin: warm and dry Neuro: no focal abnormalities noted      ASSESSMENT AND PLAN:  1. Endocarditis- reviewed Dr. Daiva Eves  note. Still continuing to search for possible etiologies. Cultures were negative. Tricuspid valve still demonstrates strand-like projections. Interesting hand manifestations with cracking in his fingertips developed prior to both episodes of endocarditis. We will continue to monitor. 2. Hyperlipidemia-he has been off of Lipitor since hospitalization/antibiotics. He is curious to see what his lipid panel is currently. We will check lipid profile today. We may need to resume his Lipitor. 3. Attention-currently under good control. Amlodipine is being utilized because of prior acute kidney injury. No longer on Micardis. 4. We'll see him back in 6 months or sooner if  symptoms manifested themselves.  Signed, Donato Schultz, MD [facility]  10/09/2013 10:14 AM

## 2013-10-11 ENCOUNTER — Other Ambulatory Visit: Payer: Self-pay | Admitting: Cardiology

## 2013-10-11 ENCOUNTER — Encounter: Payer: Self-pay | Admitting: Cardiology

## 2013-10-11 DIAGNOSIS — E7800 Pure hypercholesterolemia, unspecified: Secondary | ICD-10-CM

## 2013-10-11 MED ORDER — ATORVASTATIN CALCIUM 10 MG PO TABS: 10.0000 mg | ORAL_TABLET | Freq: Every day | ORAL | Status: DC

## 2013-10-11 NOTE — Addendum Note (Signed)
Addended by: Juniper Snyders, Bartolo Darter D on: 10/11/2013 10:51 AM   Modules accepted: Orders

## 2013-10-11 NOTE — Progress Notes (Signed)
Left message to call the office.

## 2013-10-15 DIAGNOSIS — I359 Nonrheumatic aortic valve disorder, unspecified: Secondary | ICD-10-CM | POA: Diagnosis not present

## 2013-10-16 DIAGNOSIS — I38 Endocarditis, valve unspecified: Secondary | ICD-10-CM | POA: Diagnosis not present

## 2013-10-16 DIAGNOSIS — I10 Essential (primary) hypertension: Secondary | ICD-10-CM | POA: Diagnosis not present

## 2013-10-17 DIAGNOSIS — N182 Chronic kidney disease, stage 2 (mild): Secondary | ICD-10-CM | POA: Diagnosis not present

## 2013-10-19 DIAGNOSIS — Z23 Encounter for immunization: Secondary | ICD-10-CM | POA: Diagnosis not present

## 2013-10-24 ENCOUNTER — Encounter: Payer: Self-pay | Admitting: Infectious Disease

## 2013-10-25 ENCOUNTER — Other Ambulatory Visit: Payer: Self-pay

## 2013-10-26 ENCOUNTER — Encounter: Payer: Self-pay | Admitting: Infectious Diseases

## 2013-11-06 ENCOUNTER — Encounter: Payer: Self-pay | Admitting: Infectious Disease

## 2013-11-07 ENCOUNTER — Encounter: Payer: Self-pay | Admitting: Infectious Disease

## 2013-11-21 ENCOUNTER — Ambulatory Visit (INDEPENDENT_AMBULATORY_CARE_PROVIDER_SITE_OTHER): Payer: Medicare Other | Admitting: Infectious Disease

## 2013-11-21 ENCOUNTER — Encounter: Payer: Self-pay | Admitting: Infectious Disease

## 2013-11-21 VITALS — BP 119/72 | HR 69 | Temp 97.9°F | Wt 207.0 lb

## 2013-11-21 DIAGNOSIS — L989 Disorder of the skin and subcutaneous tissue, unspecified: Secondary | ICD-10-CM | POA: Diagnosis not present

## 2013-11-21 DIAGNOSIS — N179 Acute kidney failure, unspecified: Secondary | ICD-10-CM | POA: Diagnosis not present

## 2013-11-21 DIAGNOSIS — I38 Endocarditis, valve unspecified: Secondary | ICD-10-CM

## 2013-11-21 DIAGNOSIS — R21 Rash and other nonspecific skin eruption: Secondary | ICD-10-CM

## 2013-11-21 NOTE — Addendum Note (Signed)
Addended by: Acey Lav N on: 11/21/2013 12:40 PM   Modules accepted: Level of Service

## 2013-11-21 NOTE — Progress Notes (Signed)
Subjective:    Patient ID: Eric Chambers, male    DOB: 26-Nov-1946, 67 y.o.   MRN: [id number]  HPI   67 year old man with prior history of endocarditis treated in [place],  (by hx seems likely to have been strep species since he was treated with PCN) admitted to [facility] and found to have strandlike object on TV concerning for endocarditis. Blood cultures were negative. He was sent hom on Rocephin and vancomycin but did not tolerate these with diarrhea, sweats. He was brought back in ruled out for CDI. Repeat blood cultures, changed to high dose daptomycin 8mg /kg plus cipro. Legionella abs, Chlamydia abs negative except for Chlamydia pneuonaie IgG at 1:64 Coxiella negative,  and Bartonella  Not done.  He has had repeat 2-D echocardiogram which shows persistent but stable  " hypermobile string like structures noted noted on the tricuspid chordae that could represent elongation of the chordae vs thrombus vs ?vegetation. Not significantly different than on recent TEE."  He is without fevers chills nausea or malaise. He had noted some problems with painful sensation in his fingertips with some desquamation of his fingertips skin that occurred with this episode of endocarditis and with his prior episode in [place].    Review of Systems  Constitutional: Negative for fever, chills, diaphoresis, activity change, appetite change, fatigue and unexpected weight change.  HENT: Negative for congestion, rhinorrhea, sinus pressure, sneezing, sore throat and trouble swallowing.   Eyes: Negative for photophobia and visual disturbance.  Respiratory: Negative for cough, chest tightness, shortness of breath, wheezing and stridor.   Cardiovascular: Negative for chest pain, palpitations and leg swelling.  Gastrointestinal: Negative for nausea, vomiting, abdominal pain, diarrhea, constipation, blood in stool, abdominal distention and anal bleeding.  Genitourinary: Negative for dysuria, hematuria, flank  pain and difficulty urinating.  Musculoskeletal: Negative for arthralgias, back pain, gait problem, joint swelling and myalgias.  Skin: Negative for color change, pallor, rash and wound.  Neurological: Negative for dizziness, tremors, weakness and light-headedness.  Hematological: Negative for adenopathy. Does not bruise/bleed easily.  Psychiatric/Behavioral: Negative for behavioral problems, confusion, sleep disturbance, dysphoric mood, decreased concentration and agitation.       Objective:   Physical Exam  Constitutional: He is oriented to person, place, and time. He appears well-developed and well-nourished. No distress.  HENT:  Head: Normocephalic and atraumatic.  Mouth/Throat: Oropharynx is clear and moist. No oropharyngeal exudate.  Eyes: Conjunctivae and EOM are normal. Pupils are equal, round, and reactive to light. No scleral icterus.  Neck: Normal range of motion. Neck supple. No JVD present.  Cardiovascular: Normal rate, regular rhythm and normal heart sounds.   No murmur heard. Pulmonary/Chest: Effort normal and breath sounds normal. No respiratory distress. He has no wheezes. He has no rales. He exhibits no tenderness.  Abdominal: He exhibits no distension and no mass. There is no tenderness. There is no rebound and no guarding.  Musculoskeletal: He exhibits no edema and no tenderness.  Lymphadenopathy:    He has no cervical adenopathy.  Neurological: He is alert and oriented to person, place, and time. He exhibits normal muscle tone. Coordination normal.  Skin: Skin is warm and dry. He is not diaphoretic. No erythema. No pallor.     Psychiatric: He has a normal mood and affect. His behavior is normal. Judgment and thought content normal.          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  AKI: resolving

## 2013-11-21 NOTE — Progress Notes (Signed)
Subjective:    Patient ID: Eric Chambers, male    DOB: 1946-11-05, 67 y.o.   MRN: [id number]  HPI   67 year old man with prior history of endocarditis treated in [place],  (by hx seems likely to have been strep species since he was treated with PCN) admitted to [facility] and found to have strandlike object on TV concerning for endocarditis. Blood cultures were negative. He was sent hom on Rocephin and vancomycin but did not tolerate these with diarrhea, sweats. He was brought back in ruled out for CDI. Repeat blood cultures, changed to high dose daptomycin 8mg /kg plus cipro. Legionella abs, Chlamydia abs negative except for Chlamydia pneuonaie IgG at 1:64 Coxiella negative,  and Bartonella  Not done.  He has had repeat 2-D echocardiogram which shows persistent but stable  " hypermobile string like structures noted noted on the tricuspid chordae that could represent elongation of the chordae vs thrombus vs ?vegetation. Not significantly different than on recent TEE."  He is without fevers chills nausea or malaise. He had noted some problems with painful sensation in his fingertips with some desquamation of his fingertips skin that occurred with this episode of endocarditis and with his prior episode in [place].  This has now resolved. All the serologies for Bartonella Coxiella and Chlamydia were negative in September but he has not had repeat cultures to been off antibiotics more than 3 weeks feels well is gaining weight.    Review of Systems  Constitutional: Negative for fever, chills, diaphoresis, activity change, appetite change, fatigue and unexpected weight change.  HENT: Negative for congestion, rhinorrhea, sinus pressure, sneezing, sore throat and trouble swallowing.   Eyes: Negative for photophobia and visual disturbance.  Respiratory: Negative for cough, chest tightness, shortness of breath, wheezing and stridor.   Cardiovascular: Negative for chest pain, palpitations and leg  swelling.  Gastrointestinal: Negative for nausea, vomiting, abdominal pain, diarrhea, constipation, blood in stool, abdominal distention and anal bleeding.  Genitourinary: Negative for dysuria, hematuria, flank pain and difficulty urinating.  Musculoskeletal: Negative for arthralgias, back pain, gait problem, joint swelling and myalgias.  Skin: Negative for color change, pallor, rash and wound.  Neurological: Negative for dizziness, tremors, weakness and light-headedness.  Hematological: Negative for adenopathy. Does not bruise/bleed easily.  Psychiatric/Behavioral: Negative for behavioral problems, confusion, sleep disturbance, dysphoric mood, decreased concentration and agitation.       Objective:   Physical Exam  Constitutional: He is oriented to person, place, and time. He appears well-developed and well-nourished. No distress.  HENT:  Head: Normocephalic and atraumatic.  Mouth/Throat: Oropharynx is clear and moist. No oropharyngeal exudate.  Eyes: Conjunctivae and EOM are normal. Pupils are equal, round, and reactive to light. No scleral icterus.  Neck: Normal range of motion. Neck supple. No JVD present.  Cardiovascular: Normal rate and regular rhythm.   Murmur heard.  Crescendo systolic murmur is present with a grade of 2/6  Pulmonary/Chest: Effort normal and breath sounds normal. No respiratory distress. He has no wheezes. He has no rales. He exhibits no tenderness.  Abdominal: He exhibits no distension and no mass. There is no tenderness. There is no rebound and no guarding.  Musculoskeletal: He exhibits no edema and no tenderness.  Lymphadenopathy:    He has no cervical adenopathy.  Neurological: He is alert and oriented to person, place, and time. He exhibits normal muscle tone. Coordination normal.  Skin: Skin is warm and dry. He is not diaphoretic. No erythema. No pallor.  Psychiatric: He has a  normal mood and affect. His behavior is normal. Judgment and thought content  normal.          Assessment & Plan:  Culture negative endocarditis of the tricuspid valve: possible  Vegetation is stable  --Status post six-week course of high-dose daptomycin with oral ciprofloxacin   --Check surveillance cultures today from 2 sites. --recheck Q fever and Bartonella an Chlamydia abs   Skin lesions on hands: Resolved AKI: resolving GFR was at 50 when last checked by Dr. Aram Beecham

## 2013-11-21 NOTE — Progress Notes (Signed)
Subjective:    Patient ID: Eric Chambers, male    DOB: 1946/01/01, 67 y.o.   MRN: [id number]  HPI   67 year old man with prior history of endocarditis treated in [place],  (by hx seems likely to have been strep species since he was treated with PCN) admitted to [facility] and found to have strandlike object on TV concerning for endocarditis. Blood cultures were negative. He was sent hom on Rocephin and vancomycin but did not tolerate these with diarrhea, sweats. He was brought back in ruled out for CDI. Repeat blood cultures, changed to high dose daptomycin 8mg /kg plus cipro. Legionella abs, Chlamydia abs negative except for Chlamydia pneuonaie IgG at 1:64 Coxiella negative,  and Bartonella  Not done.  He has had repeat 2-D echocardiogram which shows persistent but stable  " hypermobile string like structures noted noted on the tricuspid chordae that could represent elongation of the chordae vs thrombus vs ?vegetation. Not significantly different than on recent TEE."  He is without fevers chills nausea or malaise. He had noted some problems with painful sensation in his fingertips with some desquamation of his fingertips skin that occurred with this episode of endocarditis and with his prior episode in [place].      Review of Systems  Constitutional: Negative for fever, chills, diaphoresis, activity change, appetite change, fatigue and unexpected weight change.  HENT: Negative for congestion, rhinorrhea, sinus pressure, sneezing, sore throat and trouble swallowing.   Eyes: Negative for photophobia and visual disturbance.  Respiratory: Negative for cough, chest tightness, shortness of breath, wheezing and stridor.   Cardiovascular: Negative for chest pain, palpitations and leg swelling.  Gastrointestinal: Negative for nausea, vomiting, abdominal pain, diarrhea, constipation, blood in stool, abdominal distention and anal bleeding.  Genitourinary: Negative for dysuria, hematuria, flank  pain and difficulty urinating.  Musculoskeletal: Negative for arthralgias, back pain, gait problem, joint swelling and myalgias.  Skin: Negative for color change, pallor, rash and wound.  Neurological: Negative for dizziness, tremors, weakness and light-headedness.  Hematological: Negative for adenopathy. Does not bruise/bleed easily.  Psychiatric/Behavioral: Negative for behavioral problems, confusion, sleep disturbance, dysphoric mood, decreased concentration and agitation.       Objective:   Physical Exam  Constitutional: He is oriented to person, place, and time. He appears well-developed and well-nourished. No distress.  HENT:  Head: Normocephalic and atraumatic.  Mouth/Throat: Oropharynx is clear and moist. No oropharyngeal exudate.  Eyes: Conjunctivae and EOM are normal. Pupils are equal, round, and reactive to light. No scleral icterus.  Neck: Normal range of motion. Neck supple. No JVD present.  Cardiovascular: Normal rate, regular rhythm and normal heart sounds.   No murmur heard. Pulmonary/Chest: Effort normal and breath sounds normal. No respiratory distress. He has no wheezes. He has no rales. He exhibits no tenderness.  Abdominal: He exhibits no distension and no mass. There is no tenderness. There is no rebound and no guarding.  Musculoskeletal: He exhibits no edema and no tenderness.  Lymphadenopathy:    He has no cervical adenopathy.  Neurological: He is alert and oriented to person, place, and time. He exhibits normal muscle tone. Coordination normal.  Skin: Skin is warm and dry. He is not diaphoretic. No erythema. No pallor.     Psychiatric: He has a normal mood and affect. His behavior is normal. Judgment and thought content normal.          Assessment & Plan:  Culture negative endocarditis of the tricuspid valve: possible  Vegetation is stable  --Continue  daptomycin high dose along with oral ciprofloxacin to complete a six-week course which should and at the  end of October and then have him come back in early December for surveillance cultures --recheck Q fever and Bartonella an Chlamydia abs   Skin lesions on hands: Not present now but certainly could of been some type of phenomenon related to his endocarditis.  AKI: resolving

## 2013-11-27 LAB — CULTURE, BLOOD (SINGLE)
Organism ID, Bacteria: NO GROWTH
Organism ID, Bacteria: NO GROWTH

## 2013-11-27 LAB — BARTONELLA ANTIBODY PANEL: B henselae IgG: NEGATIVE

## 2013-12-07 DIAGNOSIS — L570 Actinic keratosis: Secondary | ICD-10-CM | POA: Diagnosis not present

## 2014-01-14 ENCOUNTER — Other Ambulatory Visit: Payer: Medicare Other

## 2014-01-15 ENCOUNTER — Other Ambulatory Visit (INDEPENDENT_AMBULATORY_CARE_PROVIDER_SITE_OTHER): Payer: Medicare Other

## 2014-01-15 DIAGNOSIS — E7800 Pure hypercholesterolemia, unspecified: Secondary | ICD-10-CM | POA: Diagnosis not present

## 2014-01-15 LAB — LIPID PANEL
CHOLESTEROL: 157 mg/dL (ref 0–200)
HDL: 41.6 mg/dL (ref >39.00)
LDL Cholesterol: 90 mg/dL (ref 0–99)
Total CHOL/HDL Ratio: 4
Triglycerides: 126 mg/dL (ref 0.0–149.0)
VLDL: 25.2 mg/dL (ref 0.0–40.0)

## 2014-02-18 DIAGNOSIS — R972 Elevated prostate specific antigen [PSA]: Secondary | ICD-10-CM | POA: Diagnosis not present

## 2014-02-22 DIAGNOSIS — R972 Elevated prostate specific antigen [PSA]: Secondary | ICD-10-CM | POA: Diagnosis not present

## 2014-02-26 ENCOUNTER — Other Ambulatory Visit (HOSPITAL_COMMUNITY): Payer: Self-pay | Admitting: Urology

## 2014-02-26 DIAGNOSIS — R972 Elevated prostate specific antigen [PSA]: Secondary | ICD-10-CM

## 2014-03-15 ENCOUNTER — Ambulatory Visit (HOSPITAL_COMMUNITY): Payer: Medicare Other

## 2014-03-27 ENCOUNTER — Inpatient Hospital Stay (HOSPITAL_COMMUNITY): Admission: RE | Admit: 2014-03-27 | Payer: Medicare Other | Source: Ambulatory Visit

## 2014-03-29 ENCOUNTER — Ambulatory Visit (HOSPITAL_COMMUNITY): Payer: Medicare Other

## 2014-04-02 DIAGNOSIS — L570 Actinic keratosis: Secondary | ICD-10-CM | POA: Diagnosis not present

## 2014-04-05 ENCOUNTER — Other Ambulatory Visit (HOSPITAL_COMMUNITY): Payer: Medicare Other

## 2014-04-12 ENCOUNTER — Ambulatory Visit (HOSPITAL_COMMUNITY): Admission: RE | Admit: 2014-04-12 | Payer: Medicare Other | Source: Ambulatory Visit

## 2014-04-12 DIAGNOSIS — R972 Elevated prostate specific antigen [PSA]: Secondary | ICD-10-CM | POA: Diagnosis not present

## 2014-04-12 DIAGNOSIS — Z0000 Encounter for general adult medical examination without abnormal findings: Secondary | ICD-10-CM | POA: Diagnosis not present

## 2014-04-18 ENCOUNTER — Other Ambulatory Visit: Payer: Self-pay | Admitting: Cardiology

## 2014-04-18 ENCOUNTER — Other Ambulatory Visit: Payer: Self-pay

## 2014-04-18 DIAGNOSIS — G4733 Obstructive sleep apnea (adult) (pediatric): Secondary | ICD-10-CM

## 2014-04-18 DIAGNOSIS — I38 Endocarditis, valve unspecified: Secondary | ICD-10-CM

## 2014-04-18 DIAGNOSIS — E7800 Pure hypercholesterolemia, unspecified: Secondary | ICD-10-CM

## 2014-04-18 DIAGNOSIS — I351 Nonrheumatic aortic (valve) insufficiency: Secondary | ICD-10-CM

## 2014-04-18 DIAGNOSIS — I10 Essential (primary) hypertension: Secondary | ICD-10-CM

## 2014-04-18 MED ORDER — ATORVASTATIN CALCIUM 10 MG PO TABS: 10.0000 mg | ORAL_TABLET | Freq: Every day | ORAL | Status: DC

## 2014-04-26 ENCOUNTER — Ambulatory Visit (HOSPITAL_COMMUNITY)
Admission: RE | Admit: 2014-04-26 | Discharge: 2014-04-26 | Disposition: A | Discharge status: Home / Self Care | Payer: Medicare Other | Source: Ambulatory Visit | Attending: Urology | Admitting: Urology

## 2014-04-26 DIAGNOSIS — N400 Enlarged prostate without lower urinary tract symptoms: Secondary | ICD-10-CM | POA: Diagnosis not present

## 2014-04-26 MED ORDER — GADOBENATE DIMEGLUMINE 529 MG/ML IV SOLN
20.0000 mL | Freq: Once | INTRAVENOUS | Status: AC | PRN
  Administered 2014-04-26: 20 mL via INTRAVENOUS

## 2014-04-29 DIAGNOSIS — I10 Essential (primary) hypertension: Secondary | ICD-10-CM | POA: Diagnosis not present

## 2014-04-29 DIAGNOSIS — H269 Unspecified cataract: Secondary | ICD-10-CM | POA: Diagnosis not present

## 2014-05-07 DIAGNOSIS — J040 Acute laryngitis: Secondary | ICD-10-CM | POA: Diagnosis not present

## 2014-05-09 DIAGNOSIS — R972 Elevated prostate specific antigen [PSA]: Secondary | ICD-10-CM | POA: Diagnosis not present

## 2014-06-06 DIAGNOSIS — E7800 Pure hypercholesterolemia, unspecified: Secondary | ICD-10-CM | POA: Diagnosis not present

## 2014-06-06 DIAGNOSIS — I10 Essential (primary) hypertension: Secondary | ICD-10-CM | POA: Diagnosis not present

## 2014-06-06 DIAGNOSIS — Z23 Encounter for immunization: Secondary | ICD-10-CM | POA: Diagnosis not present

## 2014-06-07 DIAGNOSIS — Z23 Encounter for immunization: Secondary | ICD-10-CM | POA: Diagnosis not present

## 2014-06-07 DIAGNOSIS — E7800 Pure hypercholesterolemia, unspecified: Secondary | ICD-10-CM | POA: Diagnosis not present

## 2014-06-07 DIAGNOSIS — I10 Essential (primary) hypertension: Secondary | ICD-10-CM | POA: Diagnosis not present

## 2014-07-01 DIAGNOSIS — H43819 Vitreous degeneration, unspecified eye: Secondary | ICD-10-CM | POA: Diagnosis not present

## 2014-07-01 DIAGNOSIS — H2510 Age-related nuclear cataract, unspecified eye: Secondary | ICD-10-CM | POA: Diagnosis not present

## 2014-07-01 DIAGNOSIS — H40019 Open angle with borderline findings, low risk, unspecified eye: Secondary | ICD-10-CM | POA: Diagnosis not present

## 2014-07-01 DIAGNOSIS — H25049 Posterior subcapsular polar age-related cataract, unspecified eye: Secondary | ICD-10-CM | POA: Diagnosis not present

## 2014-09-11 DIAGNOSIS — Z23 Encounter for immunization: Secondary | ICD-10-CM | POA: Diagnosis not present

## 2014-09-11 DIAGNOSIS — R059 Cough, unspecified: Secondary | ICD-10-CM | POA: Diagnosis not present

## 2014-09-11 DIAGNOSIS — R05 Cough: Secondary | ICD-10-CM | POA: Diagnosis not present

## 2014-10-01 DIAGNOSIS — D1801 Hemangioma of skin and subcutaneous tissue: Secondary | ICD-10-CM | POA: Diagnosis not present

## 2014-10-01 DIAGNOSIS — L570 Actinic keratosis: Secondary | ICD-10-CM | POA: Diagnosis not present

## 2014-10-01 DIAGNOSIS — L814 Other melanin hyperpigmentation: Secondary | ICD-10-CM | POA: Diagnosis not present

## 2014-10-29 DIAGNOSIS — H40013 Open angle with borderline findings, low risk, bilateral: Secondary | ICD-10-CM | POA: Diagnosis not present

## 2014-10-31 DIAGNOSIS — R972 Elevated prostate specific antigen [PSA]: Secondary | ICD-10-CM | POA: Diagnosis not present

## 2014-12-12 DIAGNOSIS — R3912 Poor urinary stream: Secondary | ICD-10-CM | POA: Diagnosis not present

## 2014-12-12 DIAGNOSIS — R972 Elevated prostate specific antigen [PSA]: Secondary | ICD-10-CM | POA: Diagnosis not present

## 2014-12-12 DIAGNOSIS — N401 Enlarged prostate with lower urinary tract symptoms: Secondary | ICD-10-CM | POA: Diagnosis not present

## 2014-12-23 DIAGNOSIS — R05 Cough: Secondary | ICD-10-CM | POA: Diagnosis not present

## 2015-02-18 DIAGNOSIS — I350 Nonrheumatic aortic (valve) stenosis: Secondary | ICD-10-CM | POA: Diagnosis not present

## 2015-02-18 DIAGNOSIS — E780 Pure hypercholesterolemia: Secondary | ICD-10-CM | POA: Diagnosis not present

## 2015-02-18 DIAGNOSIS — I10 Essential (primary) hypertension: Secondary | ICD-10-CM | POA: Diagnosis not present

## 2015-03-25 DIAGNOSIS — C44319 Basal cell carcinoma of skin of other parts of face: Secondary | ICD-10-CM | POA: Diagnosis not present

## 2015-03-25 DIAGNOSIS — L570 Actinic keratosis: Secondary | ICD-10-CM | POA: Diagnosis not present

## 2015-03-25 DIAGNOSIS — D485 Neoplasm of uncertain behavior of skin: Secondary | ICD-10-CM | POA: Diagnosis not present

## 2015-03-25 DIAGNOSIS — L821 Other seborrheic keratosis: Secondary | ICD-10-CM | POA: Diagnosis not present

## 2015-04-01 ENCOUNTER — Ambulatory Visit (INDEPENDENT_AMBULATORY_CARE_PROVIDER_SITE_OTHER): Payer: Medicare Other | Admitting: Cardiology

## 2015-04-01 ENCOUNTER — Encounter: Payer: Self-pay | Admitting: Cardiology

## 2015-04-01 VITALS — BP 132/72 | HR 73 | Ht 68.0 in | Wt 208.0 lb

## 2015-04-01 DIAGNOSIS — I351 Nonrheumatic aortic (valve) insufficiency: Secondary | ICD-10-CM | POA: Diagnosis not present

## 2015-04-01 DIAGNOSIS — I10 Essential (primary) hypertension: Secondary | ICD-10-CM

## 2015-04-01 DIAGNOSIS — E785 Hyperlipidemia, unspecified: Secondary | ICD-10-CM | POA: Diagnosis not present

## 2015-04-01 DIAGNOSIS — I38 Endocarditis, valve unspecified: Secondary | ICD-10-CM

## 2015-04-01 NOTE — Patient Instructions (Signed)
The current medical regimen is effective;  continue present plan and medications.  Please take antibiotics prior to any invasive procedures such as dental cleanings.  Follow up in 1 year with Dr. Marlou Porch.  You will receive a letter in the mail 2 months before you are due.  Please call us when you receive this letter to schedule your follow up appointment.  Thank you for choosing [facility]!!

## 2015-04-01 NOTE — Progress Notes (Signed)
[place]. [address], [address] [place], [place]  [address] Phone: [phone] Fax:  [phone]  Date:  04/01/2015   ID:  Eric Chambers, DOB 1946/05/03, MRN [id number]  PCP:   Melinda Crutch, MD   History of Present Illness: Eric Chambers is a 69 y.o. male with history of endocarditis, previously in [place], with admission August 2014 for presumed endocarditis. Transesophageal echocardiogram was performed which demonstrated fingerlike projections surrounding the tricuspid valve, possible vegetation. Repeat limited echocardiogram was performed in October of 2014 which demonstrated similar findings.   I reviewed infectious disease notes that he has completed a course of IV antibiotics and is continuing on PO Cipro. Daptomycin. Had reaction to Vanco and ceftraxone.   ?cracking of hands manifestation of endocarditis.   Takes dental abx.    04/01/15-overall doing well. No change clinically. No chest pain. No significant change in shortness of breath. His weight has increased a few pounds over the past few years.   Wt Readings from Last 3 Encounters:  04/01/15 208 lb (94.348 kg)  11/21/13 207 lb (93.895 kg)  10/09/13 200 lb (90.719 kg)     Past Medical History  Diagnosis Date  . Endocarditis   . High cholesterol   . Hypertension   . Enlarged prostate   . Heart murmur     "think I have one now" (08/16/2013)  . Pneumonia     "almost once/yr" (08/16/2013)  . Sleep apnea     "don't wear mask" (08/16/2013)  . Aortic valve regurgitation   . Endocarditis     Possible vegetation associated with tricuspid valve (seen on post tx echo as well)     Past Surgical History  Procedure Laterality Date  . Tonsillectomy  ~ 1957  . Inguinal hernia repair Right ~ 1950  . Umbilical hernia repair  ~ 2011  . Tee without cardioversion N/A 08/17/2013    Procedure: TRANSESOPHAGEAL ECHOCARDIOGRAM (TEE);  Surgeon: Candee Furbish, MD;  Location: [facility] ENDOSCOPY;  Service: Cardiovascular;  Laterality: N/A;     Current Outpatient Prescriptions  Medication Sig Dispense Refill  . atorvastatin (LIPITOR) 10 MG tablet Take 1 tablet (10 mg total) by mouth daily. 30 tablet 5  . finasteride (PROSCAR) 5 MG tablet Take 5 mg by mouth daily.  3  . telmisartan (MICARDIS) 40 MG tablet Take 40 mg by mouth daily.  2   No current facility-administered medications for this visit.    Allergies:   No Known Allergies  Social History:  The patient  reports that he has never smoked. He has never used smokeless tobacco. He reports that he drinks about 1.2 oz of alcohol per week. He reports that he does not use illicit drugs.   ROS:  Please see the history of present illness.   Denies any recent fevers, chills, orthopnea, PND, chest pain.   All other systems reviewed and negative.   PHYSICAL EXAM: VS:  BP 132/72 mmHg  Pulse 73  Ht 5\' 8"  (1.727 m)  Wt 208 lb (94.348 kg)  BMI 31.63 kg/m2 Well nourished, well developed, in no acute distress HEENT: normal Neck: no JVD Cardiac:  normal S1, S2; RRR; 1/6 diastolic slight musical murmur Lungs:  clear to auscultation bilaterally, no wheezing, rhonchi or rales Abd: soft, nontender, no hepatomegaly Ext: no edema Skin: warm and dry Neuro: no focal abnormalities noted     EKG: 04/01/15-sinus rhythm, 73, left axis deviation  ASSESSMENT AND PLAN:  1. Endocarditis- overall stable, no fevers. No significant  change in endurance. He is able to feed the horses well. I reviewed Dr. Tommy Medal note. Still continuing to search for possible etiologies. Cultures were negative. Tricuspid valve still demonstrates strand-like projections. Interesting hand manifestations with cracking in his fingertips developed prior to both episodes of endocarditis. We will continue to monitor. 2. Aortic regurgitation-soft musical murmur heard on exam. Diastolic.  3. Hyperlipidemia-Lipitor 10mg   since hospitalization/antibiotics. Dr. Harrington Challenger is following lipid panel. Calf pain with 20. 4. Essential  hypertension-currently under good control. Amlodipine is being utilized because of prior acute kidney injury.  5. We'll see him back in 12 months or sooner if symptoms manifested themselves.  Signed, Candee Furbish, MD [facility]  04/01/2015 8:58 AM

## 2015-04-22 DIAGNOSIS — N401 Enlarged prostate with lower urinary tract symptoms: Secondary | ICD-10-CM | POA: Diagnosis not present

## 2015-04-22 DIAGNOSIS — R972 Elevated prostate specific antigen [PSA]: Secondary | ICD-10-CM | POA: Diagnosis not present

## 2015-04-22 DIAGNOSIS — N139 Obstructive and reflux uropathy, unspecified: Secondary | ICD-10-CM | POA: Diagnosis not present

## 2015-04-22 DIAGNOSIS — R3912 Poor urinary stream: Secondary | ICD-10-CM | POA: Diagnosis not present

## 2015-05-13 DIAGNOSIS — Z23 Encounter for immunization: Secondary | ICD-10-CM | POA: Diagnosis not present

## 2015-05-14 DIAGNOSIS — C4441 Basal cell carcinoma of skin of scalp and neck: Secondary | ICD-10-CM | POA: Diagnosis not present

## 2015-05-20 DIAGNOSIS — Z23 Encounter for immunization: Secondary | ICD-10-CM | POA: Diagnosis not present

## 2015-06-10 DIAGNOSIS — Z23 Encounter for immunization: Secondary | ICD-10-CM | POA: Diagnosis not present

## 2015-07-04 DIAGNOSIS — Z0001 Encounter for general adult medical examination with abnormal findings: Secondary | ICD-10-CM | POA: Diagnosis not present

## 2015-07-04 DIAGNOSIS — I350 Nonrheumatic aortic (valve) stenosis: Secondary | ICD-10-CM | POA: Diagnosis not present

## 2015-07-04 DIAGNOSIS — I10 Essential (primary) hypertension: Secondary | ICD-10-CM | POA: Diagnosis not present

## 2015-07-04 DIAGNOSIS — E780 Pure hypercholesterolemia: Secondary | ICD-10-CM | POA: Diagnosis not present

## 2015-08-20 IMAGING — CR DG CHEST 2V
2 series · 2 of 2 positions shown · non-contrast
Comparison: None

CLINICAL DATA: Cough and chills.

CHEST - 2 VIEW

[w chest pa]
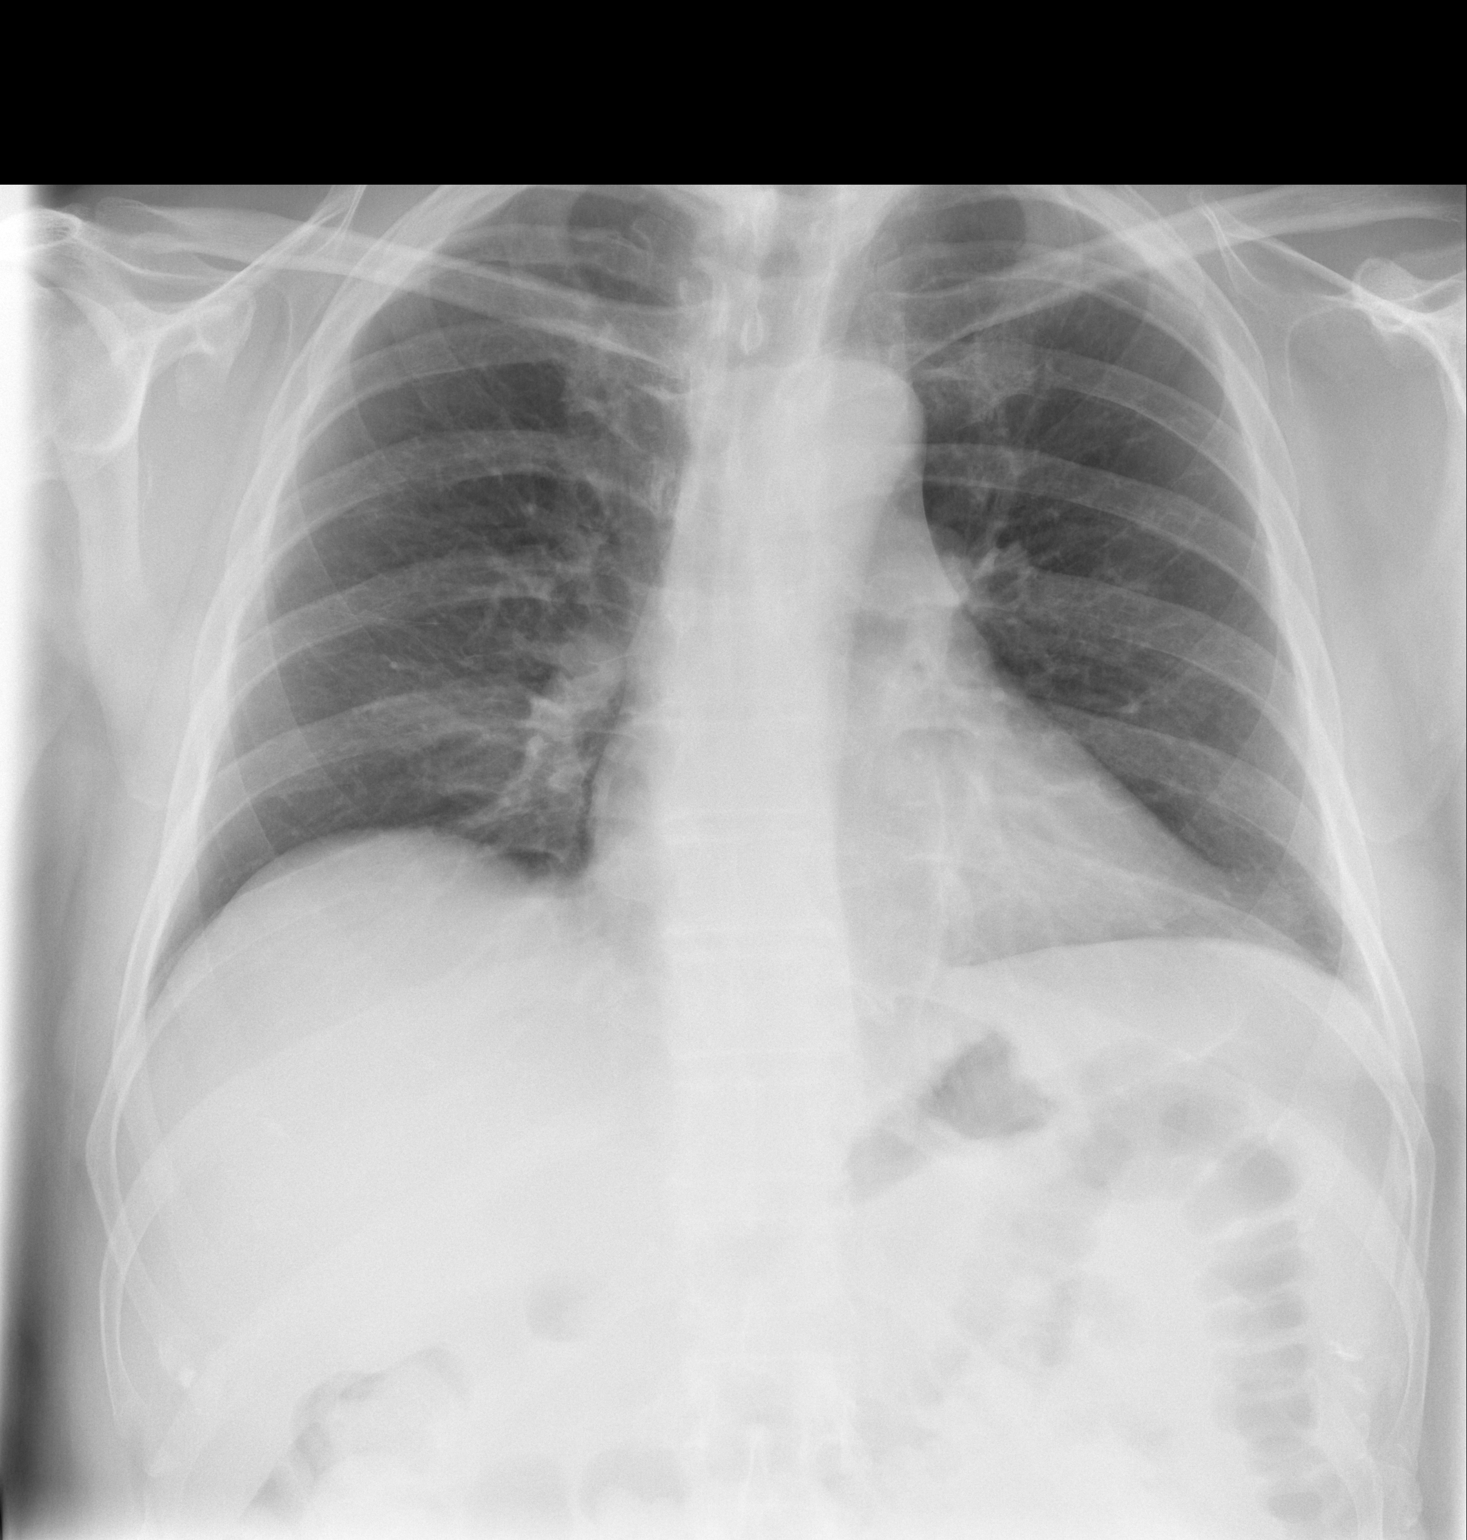

[w chest lat]
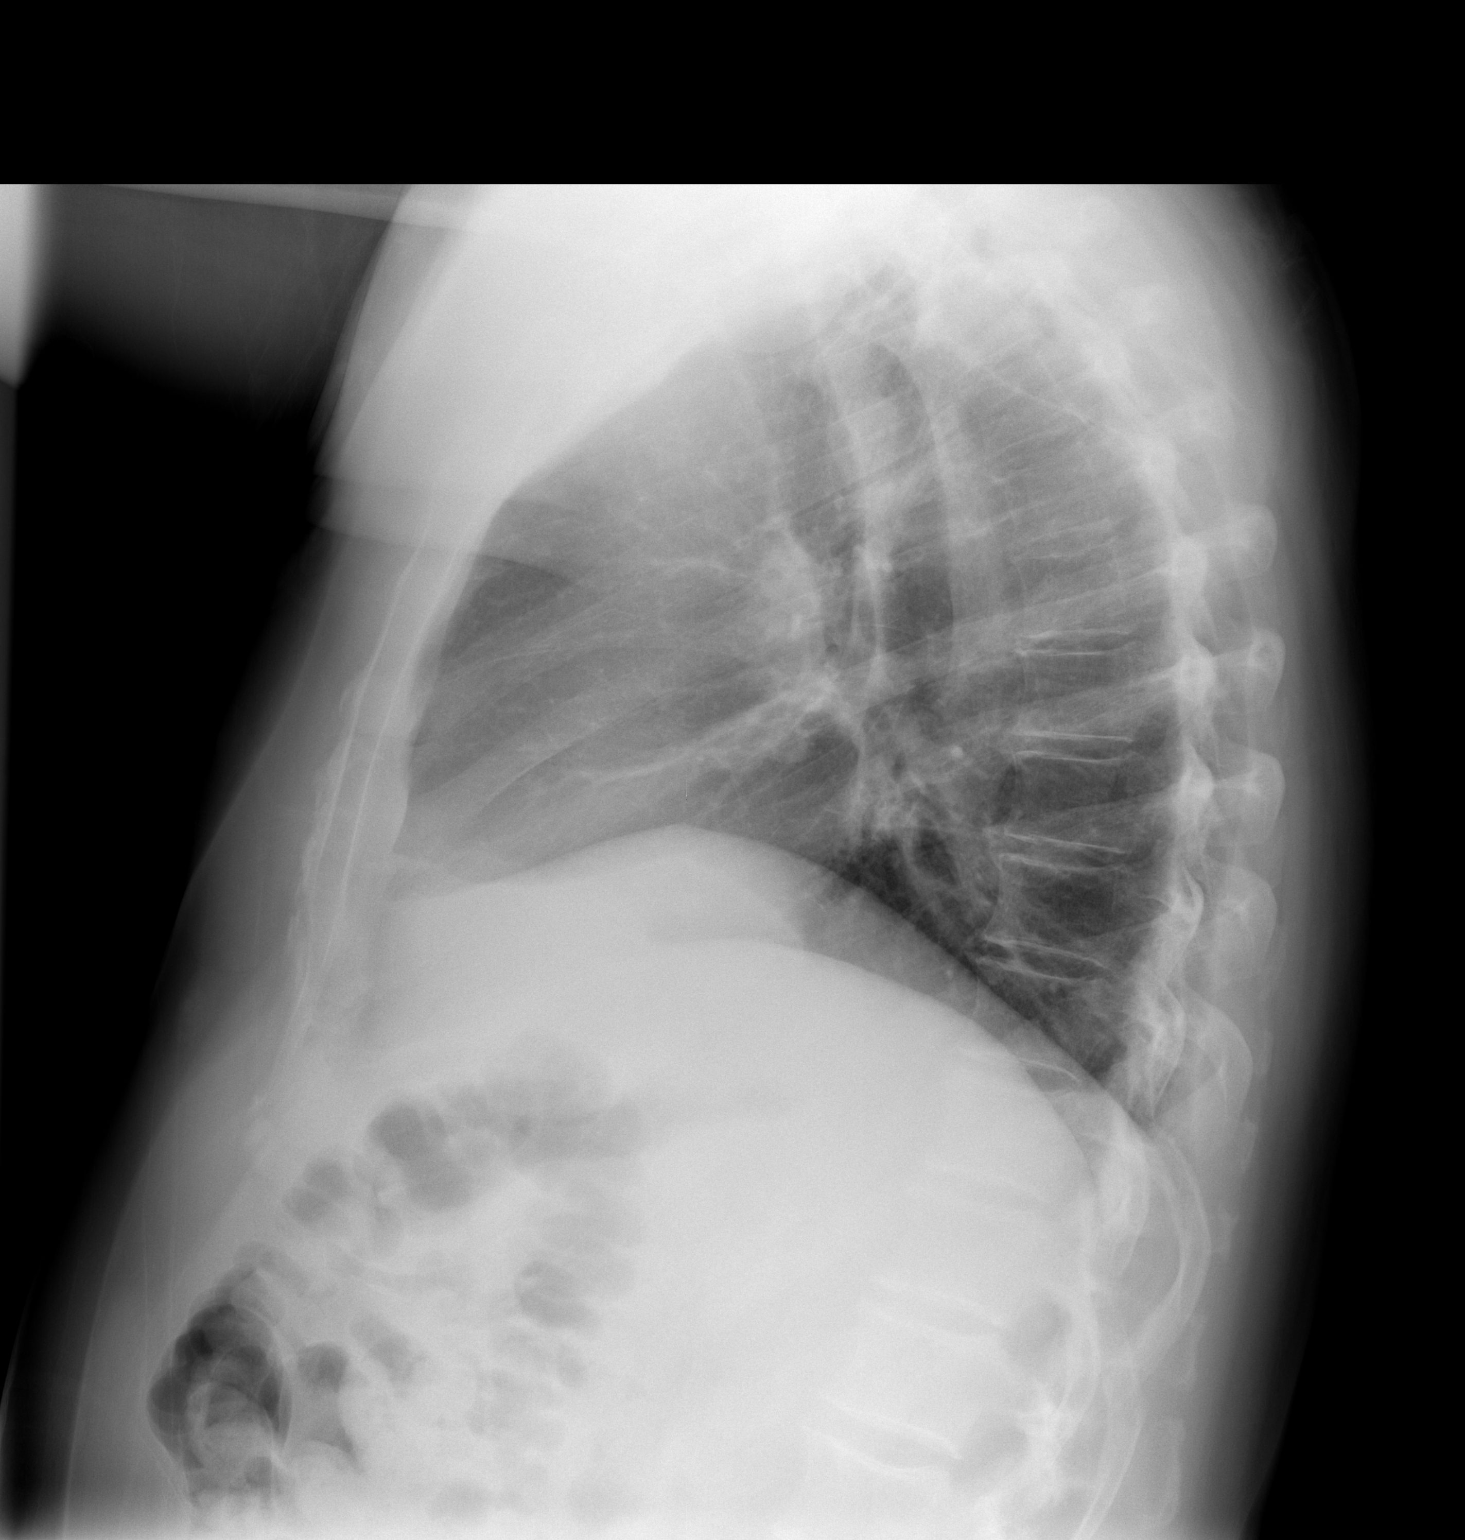

[2 of 2 positions shown; findings below may reference images not displayed]

FINDINGS: The cardiac silhouette, mediastinal and hilar contours
are within normal limits.  The lungs are clear.  No pleural
effusion.  The bony thorax is intact.
IMPRESSION: No acute cardiopulmonary findings.

## 2015-09-02 IMAGING — CR DG KNEE 1-2V*L*
2 series · 2 of 2 positions shown · non-contrast
Comparison: None

CLINICAL DATA: Knee pain.

EXAM:
LEFT KNEE - 1-2 VIEW

[t knee ap left]
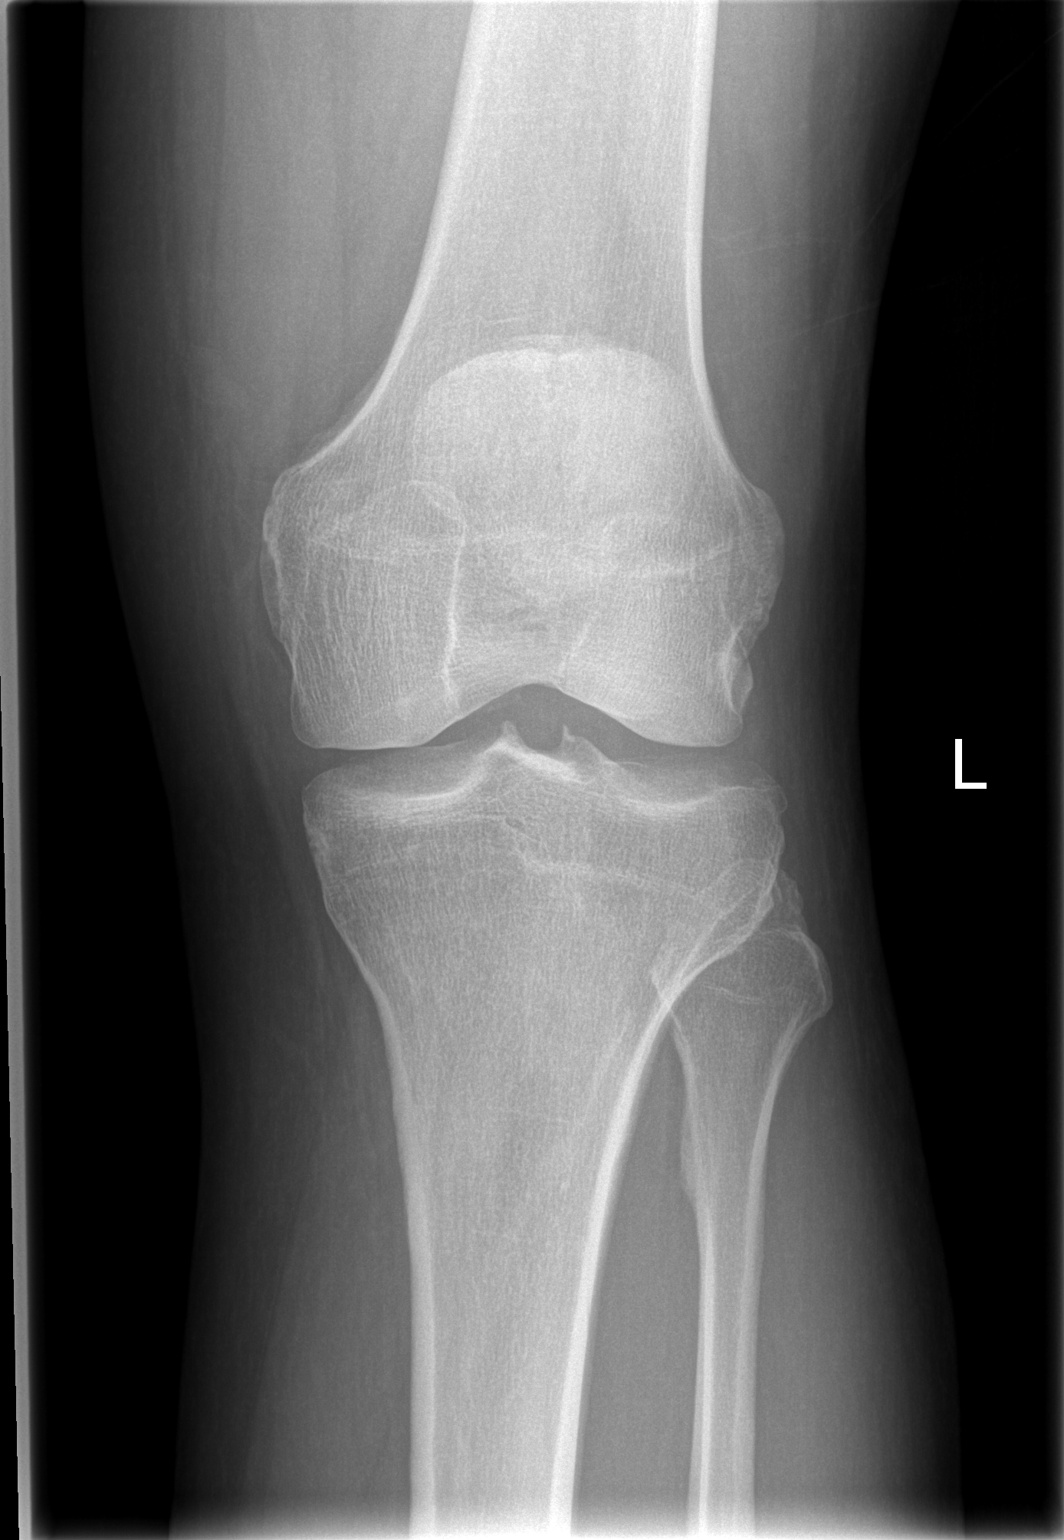

[t knee lat left]
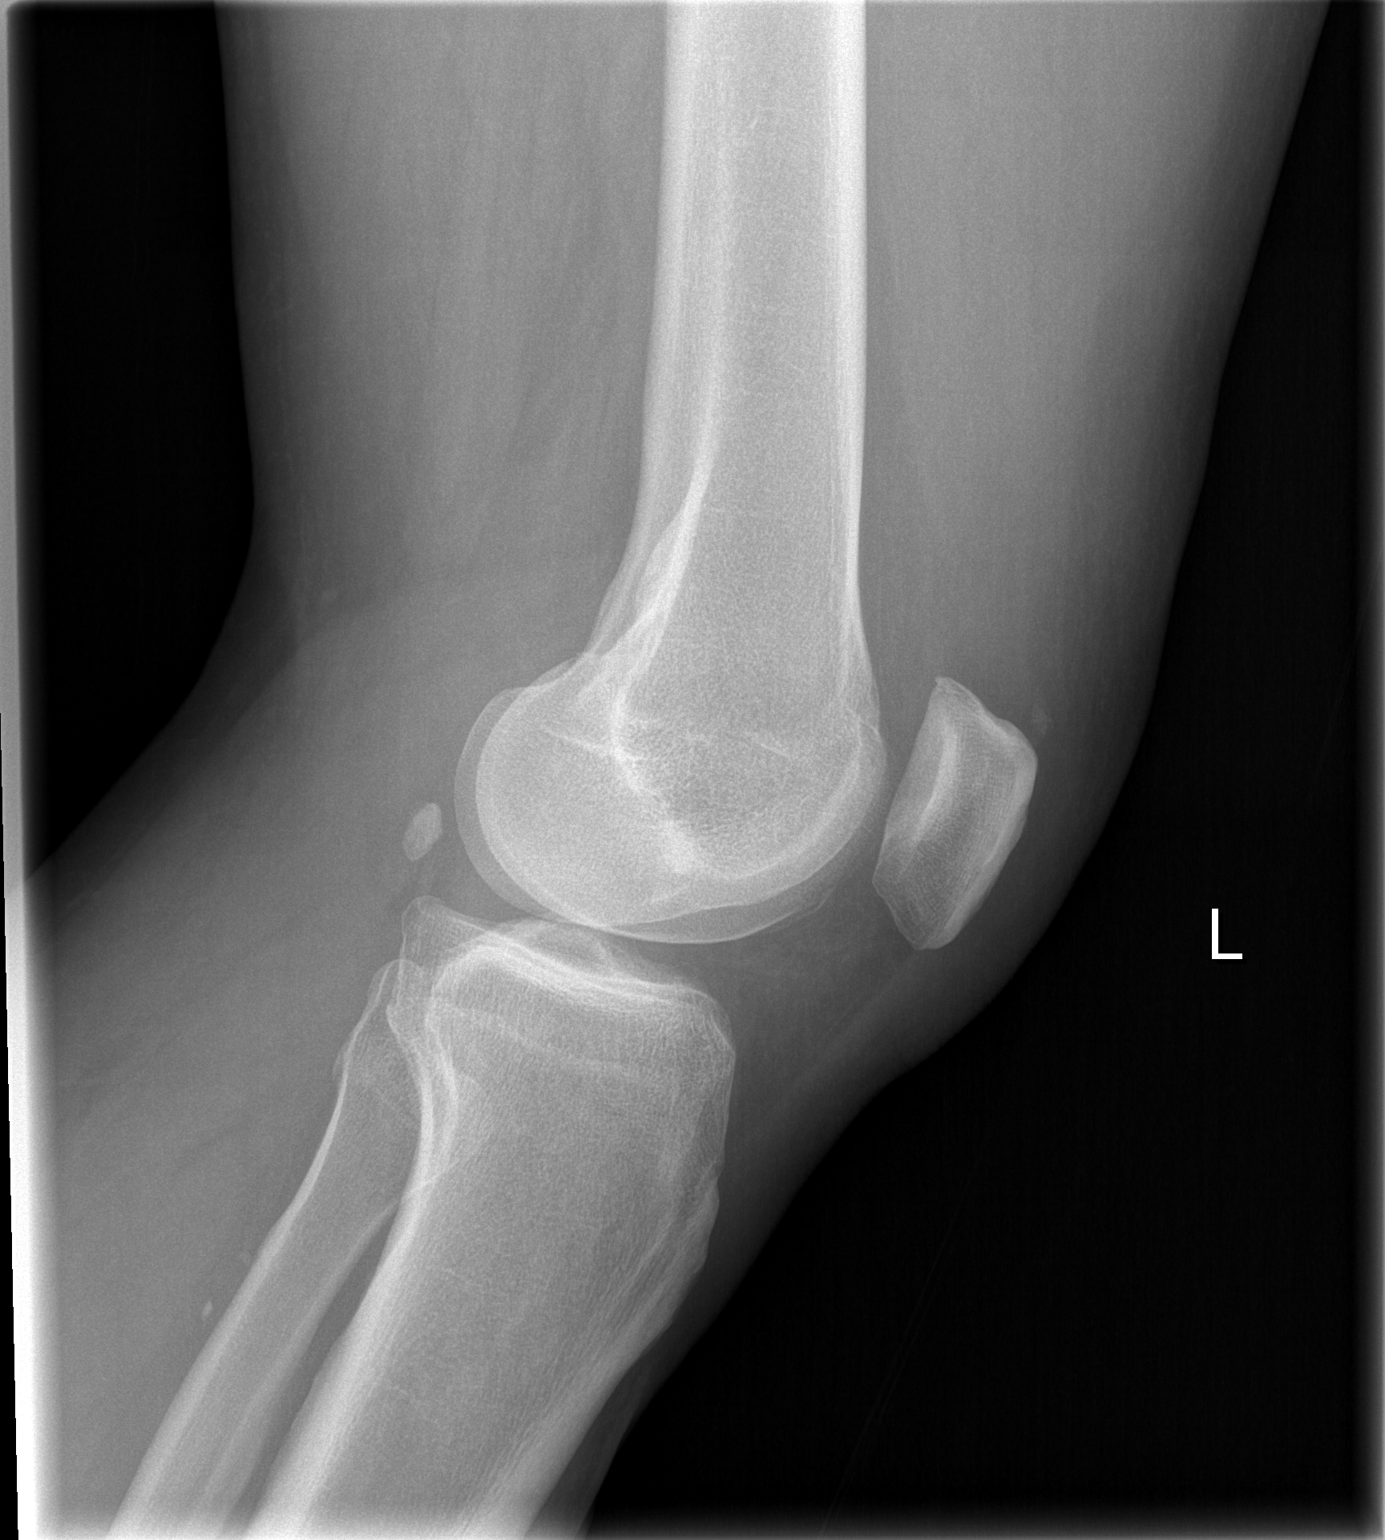

[2 of 2 positions shown; findings below may reference images not displayed]

FINDINGS: Mild degenerative spurring along the tibial spines. The No acute
bony abnormality. Specifically, no fracture, subluxation, or
dislocation. Soft tissues are intact. No joint effusion.
IMPRESSION: No acute bony abnormality.

## 2015-09-25 DIAGNOSIS — L570 Actinic keratosis: Secondary | ICD-10-CM | POA: Diagnosis not present

## 2015-09-25 DIAGNOSIS — L821 Other seborrheic keratosis: Secondary | ICD-10-CM | POA: Diagnosis not present

## 2015-09-25 DIAGNOSIS — D1801 Hemangioma of skin and subcutaneous tissue: Secondary | ICD-10-CM | POA: Diagnosis not present

## 2015-09-25 DIAGNOSIS — Z85828 Personal history of other malignant neoplasm of skin: Secondary | ICD-10-CM | POA: Diagnosis not present

## 2015-10-07 DIAGNOSIS — H40011 Open angle with borderline findings, low risk, right eye: Secondary | ICD-10-CM | POA: Diagnosis not present

## 2015-10-07 DIAGNOSIS — H2513 Age-related nuclear cataract, bilateral: Secondary | ICD-10-CM | POA: Diagnosis not present

## 2015-10-24 DIAGNOSIS — R972 Elevated prostate specific antigen [PSA]: Secondary | ICD-10-CM | POA: Diagnosis not present

## 2015-11-04 DIAGNOSIS — N139 Obstructive and reflux uropathy, unspecified: Secondary | ICD-10-CM | POA: Diagnosis not present

## 2015-11-04 DIAGNOSIS — R3912 Poor urinary stream: Secondary | ICD-10-CM | POA: Diagnosis not present

## 2015-11-04 DIAGNOSIS — R972 Elevated prostate specific antigen [PSA]: Secondary | ICD-10-CM | POA: Diagnosis not present

## 2015-11-04 DIAGNOSIS — N401 Enlarged prostate with lower urinary tract symptoms: Secondary | ICD-10-CM | POA: Diagnosis not present

## 2015-11-20 DIAGNOSIS — Z23 Encounter for immunization: Secondary | ICD-10-CM | POA: Diagnosis not present

## 2015-12-08 DIAGNOSIS — M545 Low back pain: Secondary | ICD-10-CM | POA: Diagnosis not present

## 2015-12-09 ENCOUNTER — Emergency Department (HOSPITAL_COMMUNITY)
Admission: EM | Admit: 2015-12-09 | Discharge: 2015-12-09 | Disposition: A | Discharge status: Home / Self Care | Payer: Medicare Other | Attending: Emergency Medicine | Admitting: Emergency Medicine

## 2015-12-09 ENCOUNTER — Encounter (HOSPITAL_COMMUNITY): Payer: Self-pay | Admitting: *Deleted

## 2015-12-09 ENCOUNTER — Emergency Department (HOSPITAL_COMMUNITY): Payer: Medicare Other

## 2015-12-09 DIAGNOSIS — M5441 Lumbago with sciatica, right side: Secondary | ICD-10-CM | POA: Insufficient documentation

## 2015-12-09 DIAGNOSIS — M545 Low back pain: Secondary | ICD-10-CM | POA: Diagnosis not present

## 2015-12-09 DIAGNOSIS — E7800 Pure hypercholesterolemia, unspecified: Secondary | ICD-10-CM | POA: Insufficient documentation

## 2015-12-09 DIAGNOSIS — I10 Essential (primary) hypertension: Secondary | ICD-10-CM | POA: Diagnosis not present

## 2015-12-09 DIAGNOSIS — R011 Cardiac murmur, unspecified: Secondary | ICD-10-CM | POA: Diagnosis not present

## 2015-12-09 DIAGNOSIS — Z8701 Personal history of pneumonia (recurrent): Secondary | ICD-10-CM | POA: Diagnosis not present

## 2015-12-09 DIAGNOSIS — M79604 Pain in right leg: Secondary | ICD-10-CM | POA: Diagnosis not present

## 2015-12-09 DIAGNOSIS — Z8669 Personal history of other diseases of the nervous system and sense organs: Secondary | ICD-10-CM | POA: Insufficient documentation

## 2015-12-09 DIAGNOSIS — Z79899 Other long term (current) drug therapy: Secondary | ICD-10-CM | POA: Insufficient documentation

## 2015-12-09 DIAGNOSIS — Z87438 Personal history of other diseases of male genital organs: Secondary | ICD-10-CM | POA: Diagnosis not present

## 2015-12-09 MED ORDER — IBUPROFEN 800 MG PO TABS
800.0000 mg | ORAL_TABLET | Freq: Once | ORAL | Status: AC
  Administered 2015-12-09: 800 mg via ORAL
  Filled 2015-12-09: qty 1

## 2015-12-09 MED ORDER — ACETAMINOPHEN 500 MG PO TABS
1000.0000 mg | ORAL_TABLET | Freq: Once | ORAL | Status: AC
  Administered 2015-12-09: 1000 mg via ORAL
  Filled 2015-12-09: qty 2

## 2015-12-09 NOTE — Discharge Instructions (Signed)
Take 4 over the counter ibuprofen tablets 3 times a day or 2 over-the-counter naproxen tablets twice a day for pain. No lifting greater than 10 pounds for the next week.  Sciatica Sciatica is pain, weakness, numbness, or tingling along your sciatic nerve. The nerve starts in the lower back and runs down the back of each leg. Nerve damage or certain conditions pinch or put pressure on the sciatic nerve. This causes the pain, weakness, and other discomforts of sciatica. HOME CARE   Only take medicine as told by your doctor.  Apply ice to the affected area for 20 minutes. Do this 3-4 times a day for the first 48-72 hours. Then try heat in the same way.  Exercise, stretch, or do your usual activities if these do not make your pain worse.  Go to physical therapy as told by your doctor.  Keep all doctor visits as told.  Do not wear high heels or shoes that are not supportive.  Get a firm mattress if your mattress is too soft to lessen pain and discomfort. GET HELP RIGHT AWAY IF:   You cannot control when you poop (bowel movement) or pee (urinate).  You have more weakness in your lower back, lower belly (pelvis), butt (buttocks), or legs.  You have redness or puffiness (swelling) of your back.  You have a burning feeling when you pee.  You have pain that gets worse when you lie down.  You have pain that wakes you from your sleep.  Your pain is worse than past pain.  Your pain lasts longer than 4 weeks.  You are suddenly losing weight without reason. MAKE SURE YOU:   Understand these instructions.  Will watch this condition.  Will get help right away if you are not doing well or get worse.   This information is not intended to replace advice given to you by your health care provider. Make sure you discuss any questions you have with your health care provider.   Document Released: 09/14/2008 Document Revised: 08/27/2015 Document Reviewed: 04/16/2012 Elsevier Interactive  Patient Education [employer].

## 2015-12-09 NOTE — ED Provider Notes (Signed)
CSN: [id number]     Arrival date & time 12/09/15  [id number] History   First MD Initiated Contact with Patient 12/09/15 [id number]     Chief Complaint  Patient presents with  . Hip Pain  . Leg Pain     (Consider location/radiation/quality/duration/timing/severity/associated sxs/prior Treatment) Patient is a 69 y.o. male presenting with back pain. The history is provided by the patient.  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 Patient states that he picked up a couple propane bottles on the way out of [employer] and felt a twinge back there. Worse with palpation twisting and walking. Patient saw his family doctor yesterday to take ibuprofen. 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 Pain radiates down the outside of his right leg to his foot. Denies loss of bowel or bladder denies loss of perirectal sensation denies abdominal pain. Denies fevers or chills.  Past Medical History  Diagnosis Date  . Endocarditis   . High cholesterol   . Hypertension   . Enlarged prostate   . Heart murmur     "think I have one now" (08/16/2013)  . Pneumonia     "almost once/yr" (08/16/2013)  . Sleep apnea     "don't wear mask" (08/16/2013)  . Aortic valve regurgitation   . Endocarditis     Possible vegetation associated with tricuspid valve (seen on post tx echo as well)    Past Surgical History  Procedure Laterality Date  . Tonsillectomy  ~ 1957  . Inguinal hernia repair Right ~  1950  . Umbilical hernia repair  ~ 2011  . Tee without cardioversion N/A 08/17/2013    Procedure: TRANSESOPHAGEAL ECHOCARDIOGRAM (TEE);  Surgeon: Candee Furbish, MD;  Location: [facility] ENDOSCOPY;  Service: Cardiovascular;  Laterality: N/A;   Family History  Problem Relation Age of Onset  . Hypertension Mother   . Heart attack Father 49   Social History  Substance Use Topics  . Smoking status: Never Smoker   . Smokeless tobacco: Never Used  . Alcohol Use: 1.2 oz/week    2 Glasses of wine per week    Review of Systems  Constitutional: Negative for fever and chills.  HENT: Negative for congestion and facial swelling.   Eyes: Negative for discharge and visual disturbance.  Respiratory: Negative for shortness of breath.   Cardiovascular: Negative for chest pain and palpitations.  Gastrointestinal: Negative for vomiting, abdominal pain and diarrhea.  Musculoskeletal: Positive for back pain and arthralgias. Negative for myalgias.  Skin: Negative for color change and rash.  Neurological: Negative for tremors, syncope and headaches.  Psychiatric/Behavioral: Negative for confusion and dysphoric mood.      Allergies  Review of patient's allergies indicates no known allergies.  Home Medications   Prior to Admission medications   Medication Sig Start Date End Date Taking? Authorizing Provider  atorvastatin (LIPITOR) 10 MG tablet Take 1 tablet (10 mg total) by mouth daily. 04/18/14  Yes Jerline Pain, MD  ibuprofen (ADVIL) 200  MG tablet Take 600 mg by mouth every 6 (six) hours as needed for moderate pain.   Yes Historical Provider, MD  telmisartan (MICARDIS) 40 MG tablet Take 40 mg by mouth daily. 01/06/15  Yes Historical Provider, MD   BP 153/74 mmHg  Pulse 58  Temp(Src) 97.3 F (36.3 C) (Oral)  Resp 16  SpO2 99% Physical Exam  Constitutional: He is oriented to person, place, and time. He appears well-developed and well-nourished.  HENT:  Head: Normocephalic and atraumatic.  Eyes: EOM  are normal. Pupils are equal, round, and reactive to light.  Neck: Normal range of motion. Neck supple. No JVD present.  Cardiovascular: Normal rate, regular rhythm and intact distal pulses.  Exam reveals no gallop and no friction rub.   No murmur heard. Pulmonary/Chest: No respiratory distress. He has no wheezes.  Abdominal: He exhibits no distension. There is no tenderness. There is no rebound and no guarding.  Benign abdominal exam no palpable mass  Musculoskeletal: Normal range of motion. He exhibits tenderness.  Patient able to ambulate without difficulty. Pulse motor and sensation intact distally. Tender palpation worst about the paraspinal musculature L2 through 4. Reflexes intact and equal bilaterally.   Neurological: He is alert and oriented to person, place, and time.  Skin: No rash noted. No pallor.  Psychiatric: He has a normal mood and affect. His behavior is normal.  Nursing note and vitals reviewed.   ED Course  Procedures (including critical care time) Labs Review Labs Reviewed - No data to display  Imaging Review Dg Lumbar Spine Complete  12/09/2015  CLINICAL DATA:  Patient with trauma lifting heavy object 1 week prior. Increased lower back pain. Initial encounter. EXAM: LUMBAR SPINE - COMPLETE 4+ VIEW COMPARISON:  None. FINDINGS: Normal anatomic alignment. No evidence for acute fracture or dislocation. Lower lumbar spine degenerative disc and facet disease. Preservation of the vertebral body heights. SI joints are unremarkable. Unremarkable visualized bowel gas pattern. IMPRESSION: Lower lumbar spine degenerative changes without acute osseous abnormality. Electronically Signed   By: Lovey Newcomer M.D.   On: 12/09/2015 10:37   I have personally reviewed and evaluated these images and lab results as part of my medical decision-making.   EKG Interpretation None      MDM   Final diagnoses:  Right-sided low back pain with right-sided sciatica    69 yo M with a chief  complaint of right-sided low back pain. Reproducible on exam.Only red flag is patient age. Will obtain a plain film. Suspect likely muscular skeletal in nature. We'll have him take ibuprofen round the clock, abstain from heavy lifting for the next couple weeks.    I have discussed the diagnosis/risks/treatment options with the patient and believe the pt to be eligible for discharge home to follow-up with PCP. We also discussed returning to the ED immediately if new or worsening sx occur. We discussed the sx which are most concerning (e.g., sudden worsening pain, fever, cauda equina) that necessitate immediate return. Medications administered to the patient during their visit and any new prescriptions provided to the patient are listed below.  Medications given during this visit Medications  acetaminophen (TYLENOL) tablet 1,000 mg (1,000 mg Oral Given 12/09/15 1008)  ibuprofen (ADVIL,MOTRIN) tablet 800 mg (800 mg Oral Given 12/09/15 1008)    Discharge Medication List as of 12/09/2015 10:41 AM      The patient appears reasonably screen and/or stabilized for discharge and I doubt any other medical condition or other [facility] requiring further screening, evaluation, or  treatment in the ED at this time prior to discharge.    Deno Etienne, DO 12/09/15 1501

## 2015-12-09 NOTE — ED Notes (Signed)
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 Pain has progressed. Pt went to pcp yesterday, was told to take aspirin and that pt might have pulled some ligaments in his hip. Today pts hip pain has increased and now pain radiates down right leg to calf. Pt limping when ambulating now. Pain 7/10.

## 2015-12-09 NOTE — ED Notes (Signed)
MD at bedside. 

## 2015-12-30 DIAGNOSIS — L570 Actinic keratosis: Secondary | ICD-10-CM | POA: Diagnosis not present

## 2016-01-12 DIAGNOSIS — M545 Low back pain: Secondary | ICD-10-CM | POA: Diagnosis not present

## 2016-01-19 DIAGNOSIS — M545 Low back pain: Secondary | ICD-10-CM | POA: Diagnosis not present

## 2016-01-26 DIAGNOSIS — M545 Low back pain: Secondary | ICD-10-CM | POA: Diagnosis not present

## 2016-01-28 ENCOUNTER — Encounter: Payer: Self-pay | Admitting: Cardiology

## 2016-02-02 DIAGNOSIS — M545 Low back pain: Secondary | ICD-10-CM | POA: Diagnosis not present

## 2016-02-05 ENCOUNTER — Ambulatory Visit (INDEPENDENT_AMBULATORY_CARE_PROVIDER_SITE_OTHER): Payer: Medicare Other | Admitting: Cardiology

## 2016-02-05 ENCOUNTER — Encounter: Payer: Self-pay | Admitting: Cardiology

## 2016-02-05 VITALS — BP 126/78 | HR 67 | Ht 68.0 in | Wt 206.8 lb

## 2016-02-05 DIAGNOSIS — I351 Nonrheumatic aortic (valve) insufficiency: Secondary | ICD-10-CM | POA: Diagnosis not present

## 2016-02-05 DIAGNOSIS — I10 Essential (primary) hypertension: Secondary | ICD-10-CM

## 2016-02-05 DIAGNOSIS — I38 Endocarditis, valve unspecified: Secondary | ICD-10-CM

## 2016-02-05 DIAGNOSIS — R011 Cardiac murmur, unspecified: Secondary | ICD-10-CM

## 2016-02-05 DIAGNOSIS — E785 Hyperlipidemia, unspecified: Secondary | ICD-10-CM

## 2016-02-05 NOTE — Patient Instructions (Signed)

## 2016-02-05 NOTE — Progress Notes (Signed)
Eric Chambers. [address], [address] [place], [place]  [address] Phone: [phone] Fax:  [phone]  Date:  02/05/2016   ID:  Eric Chambers, DOB 11/21/1946, MRN [id number]  PCP:   Melinda Crutch, MD   History of Present Illness: Eric Chambers is a 70 y.o. male with history of endocarditis, previously in [place], with admission August 2014 for presumed endocarditis. Transesophageal echocardiogram was performed which demonstrated fingerlike projections surrounding the tricuspid valve, possible vegetation. Repeat limited echocardiogram was performed in October of 2014 which demonstrated similar findings.   I reviewed infectious disease notes that he has completed a course of IV antibiotics and is continuing on PO Cipro. Daptomycin. Had reaction to Vanco and ceftraxone.   ?cracking of hands manifestation of endocarditis. He has battled with dry cracked hands for several years. His prior dermatologist in [place] tried everything he states.  Takes dental abx.    04/01/15-overall doing well. No change clinically. No chest pain. No significant change in shortness of breath. His weight has increased a few pounds over the past few years.  02/05/16-overall doing well. Sometimes feels fatigued but he is resting well. No shortness of breath, no chest pain. Still battling with dry cracked hands from most his entire life he states.   Wt Readings from Last 3 Encounters:  02/05/16 206 lb 12.8 oz (93.804 kg)  04/01/15 208 lb (94.348 kg)  11/21/13 207 lb (93.895 kg)     Past Medical History  Diagnosis Date  . Endocarditis   . High cholesterol   . Hypertension   . Enlarged prostate   . Heart murmur     "think I have one now" (08/16/2013)  . Pneumonia     "almost once/yr" (08/16/2013)  . Sleep apnea     "don't wear mask" (08/16/2013)  . Aortic valve regurgitation   . Endocarditis     Possible vegetation associated with tricuspid valve (seen on post tx echo as well)     Past Surgical History    Procedure Laterality Date  . Tonsillectomy  ~ 1957  . Inguinal hernia repair Right ~ 1950  . Umbilical hernia repair  ~ 2011  . Tee without cardioversion N/A 08/17/2013    Procedure: TRANSESOPHAGEAL ECHOCARDIOGRAM (TEE);  Surgeon: Candee Furbish, MD;  Location: [facility] ENDOSCOPY;  Service: Cardiovascular;  Laterality: N/A;    Current Outpatient Prescriptions  Medication Sig Dispense Refill  . atorvastatin (LIPITOR) 10 MG tablet Take 1 tablet (10 mg total) by mouth daily. 30 tablet 5  . ibuprofen (ADVIL) 200 MG tablet Take 600 mg by mouth every 6 (six) hours as needed for moderate pain.    Marland Kitchen telmisartan (MICARDIS) 40 MG tablet Take 40 mg by mouth daily.  2   No current facility-administered medications for this visit.    Allergies:   No Known Allergies  Social History:  The patient  reports that he has never smoked. He has never used smokeless tobacco. He reports that he drinks about 1.2 oz of alcohol per week. He reports that he does not use illicit drugs.   ROS:  Please see the history of present illness.   Denies any recent fevers, chills, orthopnea, PND, chest pain.   All other systems reviewed and negative.   PHYSICAL EXAM: VS:  BP 126/78 mmHg  Pulse 67  Ht 5\' 8"  (1.727 m)  Wt 206 lb 12.8 oz (93.804 kg)  BMI 31.45 kg/m2 Well nourished, well developed, in no acute distress HEENT: normal Neck: no JVD  Cardiac:  normal S1, S2; RRR; 1/6 diastolic slight musical murmur Lungs:  clear to auscultation bilaterally, no wheezing, rhonchi or rales Abd: soft, nontender, no hepatomegaly Ext: no edema Skin: warm and dry Neuro: no focal abnormalities noted     EKG: EKG ordered today. EKG done today normal sinus rhythm, 67, left axis deviation 04/01/15-sinus rhythm, 73, left axis deviation.  ASSESSMENT AND PLAN:  1. History of Endocarditis- overall stable, no fevers. No significant change in endurance. Sometimes feels tired. He is able to feed the horses well. I reviewed Dr. Tommy Medal note.  Cultures were negative. Tricuspid valve still demonstrates strand-like projections. I will repeat echocardiogram. Interesting hand manifestations with cracking in his fingertips developed prior to both episodes of endocarditis. We will continue to monitor. 2. Aortic regurgitation-soft musical murmur heard on exam. Interesting. Diastolic.  3. Hyperlipidemia-Lipitor 10mg   since hospitalization/antibiotics. Dr. Harrington Challenger is following lipid panel. Calf pain with 20. 4. Essential hypertension-currently under good control. Amlodipine is being utilized because of prior acute kidney injury.  5. We'll see him back in 12 months or sooner if symptoms manifested themselves.  Signed, Candee Furbish, MD [facility]  02/05/2016 8:53 AM

## 2016-02-09 DIAGNOSIS — M545 Low back pain: Secondary | ICD-10-CM | POA: Diagnosis not present

## 2016-03-08 DIAGNOSIS — L570 Actinic keratosis: Secondary | ICD-10-CM | POA: Diagnosis not present

## 2016-03-09 DIAGNOSIS — Z0000 Encounter for general adult medical examination without abnormal findings: Secondary | ICD-10-CM | POA: Diagnosis not present

## 2016-03-09 DIAGNOSIS — N139 Obstructive and reflux uropathy, unspecified: Secondary | ICD-10-CM | POA: Diagnosis not present

## 2016-03-09 DIAGNOSIS — R361 Hematospermia: Secondary | ICD-10-CM | POA: Diagnosis not present

## 2016-03-09 DIAGNOSIS — N401 Enlarged prostate with lower urinary tract symptoms: Secondary | ICD-10-CM | POA: Diagnosis not present

## 2016-03-16 ENCOUNTER — Other Ambulatory Visit: Payer: Self-pay | Admitting: Family Medicine

## 2016-03-16 DIAGNOSIS — M5416 Radiculopathy, lumbar region: Secondary | ICD-10-CM | POA: Diagnosis not present

## 2016-04-01 ENCOUNTER — Other Ambulatory Visit (HOSPITAL_COMMUNITY): Payer: Self-pay

## 2016-04-05 ENCOUNTER — Ambulatory Visit
Admission: RE | Admit: 2016-04-05 | Discharge: 2016-04-05 | Disposition: A | Discharge status: Home / Self Care | Payer: Medicare Other | Source: Ambulatory Visit | Attending: Family Medicine | Admitting: Family Medicine

## 2016-04-05 DIAGNOSIS — M5416 Radiculopathy, lumbar region: Secondary | ICD-10-CM

## 2016-04-05 DIAGNOSIS — M4806 Spinal stenosis, lumbar region: Secondary | ICD-10-CM | POA: Diagnosis not present

## 2016-04-06 DIAGNOSIS — D225 Melanocytic nevi of trunk: Secondary | ICD-10-CM | POA: Diagnosis not present

## 2016-04-06 DIAGNOSIS — Z85828 Personal history of other malignant neoplasm of skin: Secondary | ICD-10-CM | POA: Diagnosis not present

## 2016-04-06 DIAGNOSIS — D1801 Hemangioma of skin and subcutaneous tissue: Secondary | ICD-10-CM | POA: Diagnosis not present

## 2016-04-06 DIAGNOSIS — L821 Other seborrheic keratosis: Secondary | ICD-10-CM | POA: Diagnosis not present

## 2016-04-06 DIAGNOSIS — L570 Actinic keratosis: Secondary | ICD-10-CM | POA: Diagnosis not present

## 2016-04-08 ENCOUNTER — Other Ambulatory Visit: Payer: Self-pay

## 2016-04-08 ENCOUNTER — Ambulatory Visit (HOSPITAL_COMMUNITY): Payer: Medicare Other | Attending: Cardiology

## 2016-04-08 DIAGNOSIS — I10 Essential (primary) hypertension: Secondary | ICD-10-CM

## 2016-04-08 DIAGNOSIS — I77810 Thoracic aortic ectasia: Secondary | ICD-10-CM

## 2016-04-08 DIAGNOSIS — R011 Cardiac murmur, unspecified: Secondary | ICD-10-CM | POA: Diagnosis not present

## 2016-04-08 DIAGNOSIS — I351 Nonrheumatic aortic (valve) insufficiency: Secondary | ICD-10-CM | POA: Diagnosis not present

## 2016-04-08 DIAGNOSIS — I253 Aneurysm of heart: Secondary | ICD-10-CM | POA: Diagnosis not present

## 2016-04-08 DIAGNOSIS — G4733 Obstructive sleep apnea (adult) (pediatric): Secondary | ICD-10-CM | POA: Diagnosis not present

## 2016-04-08 DIAGNOSIS — I119 Hypertensive heart disease without heart failure: Secondary | ICD-10-CM | POA: Diagnosis not present

## 2016-04-08 DIAGNOSIS — E785 Hyperlipidemia, unspecified: Secondary | ICD-10-CM | POA: Diagnosis not present

## 2016-04-08 HISTORY — DX: Thoracic aortic ectasia: I77.810

## 2016-04-15 DIAGNOSIS — M5416 Radiculopathy, lumbar region: Secondary | ICD-10-CM | POA: Diagnosis not present

## 2016-04-15 DIAGNOSIS — M542 Cervicalgia: Secondary | ICD-10-CM | POA: Diagnosis not present

## 2016-04-15 DIAGNOSIS — Z23 Encounter for immunization: Secondary | ICD-10-CM | POA: Diagnosis not present

## 2016-04-19 DIAGNOSIS — M549 Dorsalgia, unspecified: Secondary | ICD-10-CM | POA: Diagnosis not present

## 2016-04-19 DIAGNOSIS — M5126 Other intervertebral disc displacement, lumbar region: Secondary | ICD-10-CM | POA: Diagnosis not present

## 2016-04-19 DIAGNOSIS — Z6831 Body mass index (BMI) 31.0-31.9, adult: Secondary | ICD-10-CM | POA: Diagnosis not present

## 2016-04-22 ENCOUNTER — Other Ambulatory Visit: Payer: Self-pay | Admitting: Neurosurgery

## 2016-04-22 DIAGNOSIS — M5126 Other intervertebral disc displacement, lumbar region: Secondary | ICD-10-CM

## 2016-04-27 DIAGNOSIS — L570 Actinic keratosis: Secondary | ICD-10-CM | POA: Diagnosis not present

## 2016-04-29 IMAGING — MR MR PROSTATE WO/W CM
33 of 54 series · 33 of 54 positions shown · IV contrast (multihance)
Comparison: None.

CLINICAL DATA: Prior negative biopsy. Increased PSA level. Evaluate
for possible carcinoma.

EXAM:
MR PROSTATE WITHOUT AND WITH CONTRAST
TECHNIQUE: Multiplanar multisequence MRI images were obtained of the pelvis
centered about the prostate. Pre and post contrast images were
obtained.
CONTRAST:  20mL MULTIHANCE GADOBENATE DIMEGLUMINE 529 MG/ML IV SOLN

[Series 1: (id) loc · axial · 8.0mm · 0.78mm/px · 1 of 15 slices shown]
[im 1/15]
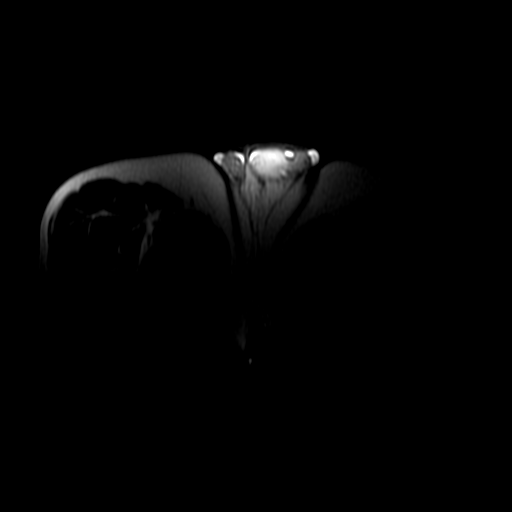

[Series 3: bSSFP fat-sat · axial · 6.0mm · 0.86mm/px · 1 of 44 slices shown]
[im 1/44]
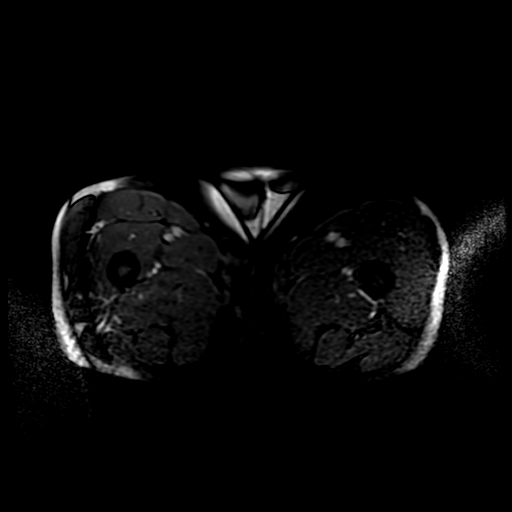

[Series 4: T1 · axial · 6.0mm · 0.86mm/px · 1 of 43 slices shown (1 of 2)]
[im 1/43]
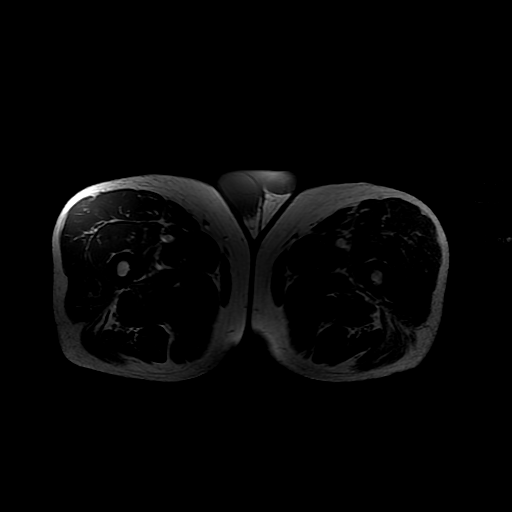

[Series 5: T2 · axial · 3.0mm · 0.29mm/px · 1 of 30 slices shown (1 of 3)]
[im 1/30]
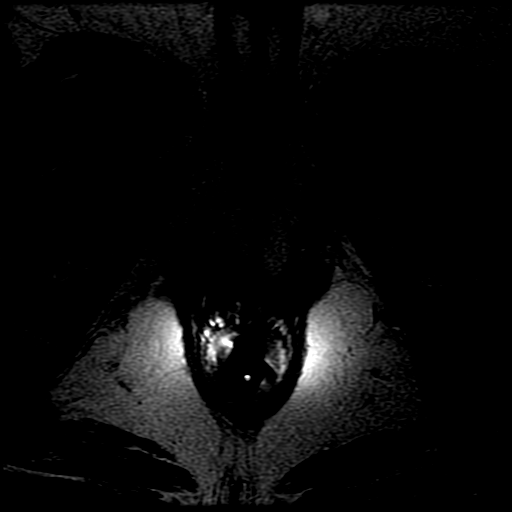

[Series 6: T1 · axial · 3.0mm · 0.29mm/px · 1 of 30 slices shown (2 of 2)]
[im 1/30]
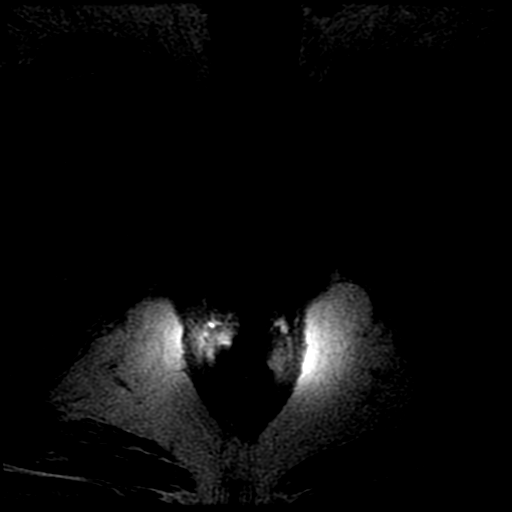

[Series 7: T2 · sagittal · 4.0mm · 0.29mm/px · 1 of 20 slices shown (2 of 3)]
[im 1/20]
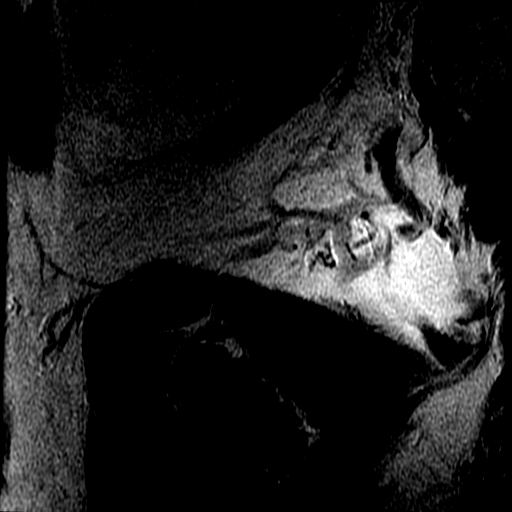

[Series 8: T2 · coronal · 4.0mm · 0.29mm/px · 1 of 19 slices shown (3 of 3)]
[im 1/19]
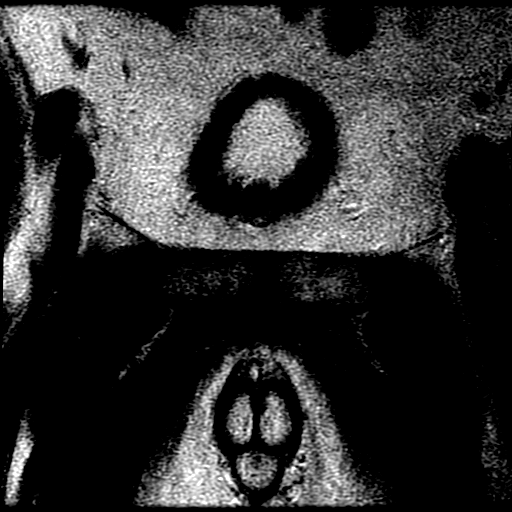

[Series 9: DWI · axial · 3.0mm · 0.59mm/px · 1 of 58 slices shown (1 of 6)]
[im 1/58]
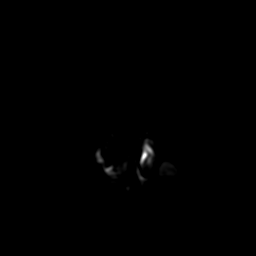

[Series 10: DWI · axial · 3.0mm · 0.59mm/px · 1 of 59 slices shown (2 of 6)]
[im 1/59]
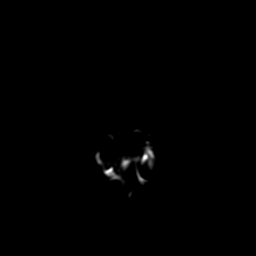

[Series 11: DWI · axial · 3.0mm · 0.59mm/px · 1 of 59 slices shown (3 of 6)]
[im 1/59]
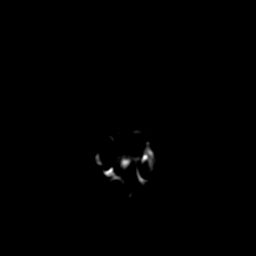

[Series 900: DWI · axial · 3.0mm · 0.59mm/px · 1 of 30 slices shown (4 of 6)]
[im 1/30]
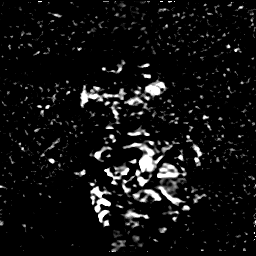

[Series 1000: DWI · axial · 3.0mm · 0.59mm/px · 1 of 30 slices shown (5 of 6)]
[im 1/30]
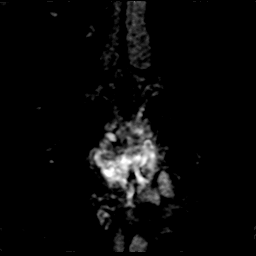

[Series 1100: DWI · axial · 3.0mm · 0.59mm/px · 1 of 30 slices shown (6 of 6)]
[im 1/30]
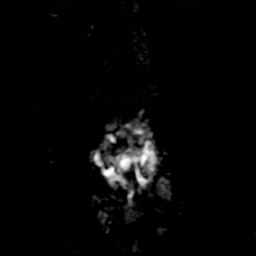

[((id)/(id)/1)-((id)/(id)/1) · axial · 3.0mm · 0.86mm/px · 1 of 71 slices shown (1 of 20)]
[im 1/71]
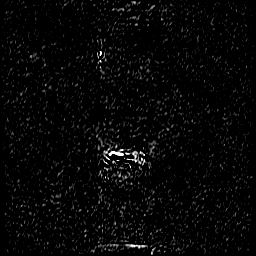

[((id)/(id)/1)-((id)/(id)/1) · axial · 3.0mm · 0.86mm/px · 1 of 68 slices shown (2 of 20)]
[im 1/68]
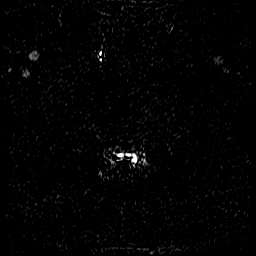

[((id)/(id)/1)-((id)/(id)/1) · axial · 3.0mm · 0.86mm/px · 1 of 63 slices shown (3 of 20)]
[im 1/63]
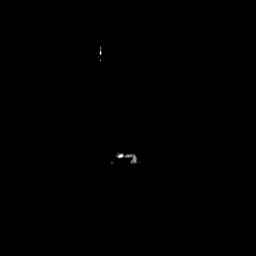

[((id)/(id)/1)-((id)/(id)/1) · axial · 3.0mm · 0.86mm/px · 1 of 60 slices shown (4 of 20)]
[im 1/60]
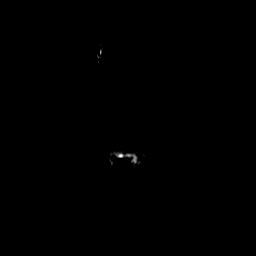

[((id)/(id)/1)-((id)/(id)/1) · axial · 3.0mm · 0.86mm/px · 1 of 61 slices shown (5 of 20)]
[im 1/61]
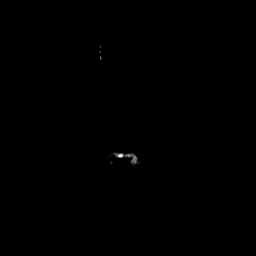

[((id)/(id)/1)-((id)/(id)/1) · axial · 3.0mm · 0.86mm/px · 1 of 61 slices shown (6 of 20)]
[im 1/61]
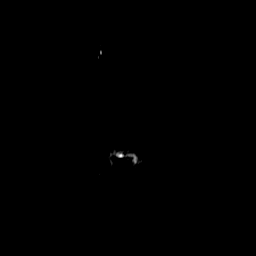

[((id)/(id)/1)-((id)/(id)/1) · axial · 3.0mm · 0.86mm/px · 1 of 61 slices shown (7 of 20)]
[im 1/61]
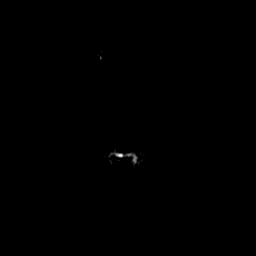

[((id)/(id)/1)-((id)/(id)/1) · axial · 3.0mm · 0.86mm/px · 1 of 60 slices shown (8 of 20)]
[im 1/60]
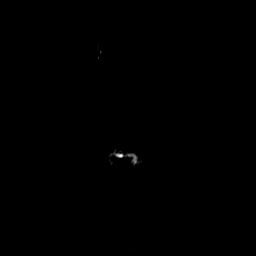

[((id)/(id)/1)-((id)/(id)/1) · axial · 3.0mm · 0.86mm/px · 1 of 62 slices shown (9 of 20)]
[im 1/62]
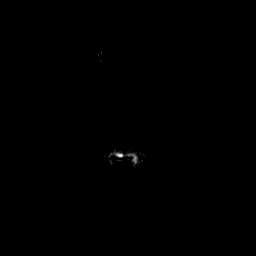

[((id)/(id)/1)-((id)/(id)/1) · axial · 3.0mm · 0.86mm/px · 1 of 62 slices shown (10 of 20)]
[im 1/62]
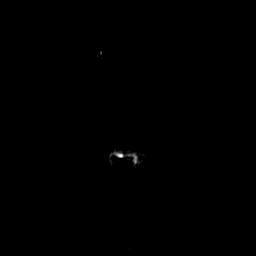

[((id)/(id)/1)-((id)/(id)/1) · axial · 3.0mm · 0.86mm/px · 1 of 63 slices shown (11 of 20)]
[im 1/63]
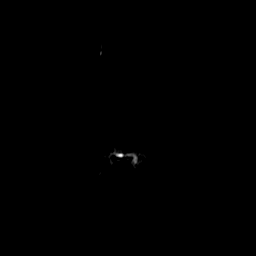

[((id)/(id)/1)-((id)/(id)/1) · axial · 3.0mm · 0.86mm/px · 1 of 61 slices shown (12 of 20)]
[im 1/61]
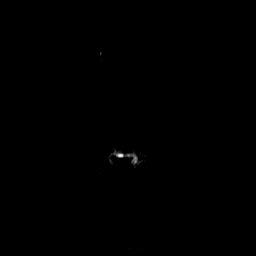

[((id)/(id)/1)-((id)/(id)/1) · axial · 3.0mm · 0.86mm/px · 1 of 61 slices shown (13 of 20)]
[im 1/61]
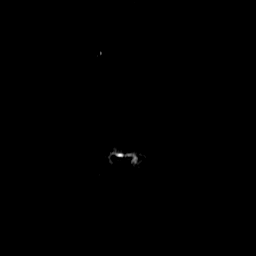

[((id)/(id)/1)-((id)/(id)/1) · axial · 3.0mm · 0.86mm/px · 1 of 61 slices shown (14 of 20)]
[im 1/61]
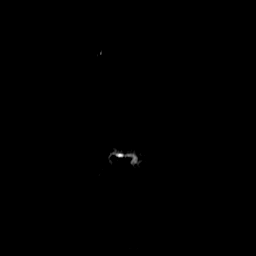

[((id)/(id)/1)-((id)/(id)/1) · axial · 3.0mm · 0.86mm/px · 1 of 62 slices shown (15 of 20)]
[im 1/62]
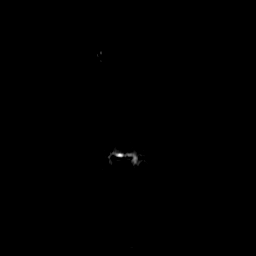

[((id)/(id)/1)-((id)/(id)/1) · axial · 3.0mm · 0.86mm/px · 1 of 62 slices shown (16 of 20)]
[im 1/62]
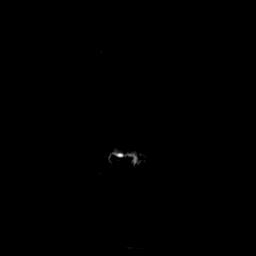

[((id)/(id)/1)-((id)/(id)/1) · axial · 3.0mm · 0.86mm/px · 1 of 65 slices shown (17 of 20)]
[im 1/65]
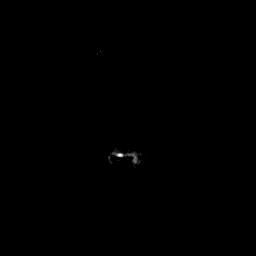

[((id)/(id)/1)-((id)/(id)/1) · axial · 3.0mm · 0.86mm/px · 1 of 62 slices shown (18 of 20)]
[im 1/62]
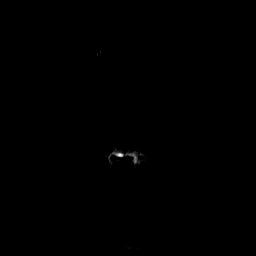

[((id)/(id)/1)-((id)/(id)/1) · axial · 3.0mm · 0.86mm/px · 1 of 61 slices shown (19 of 20)]
[im 1/61]
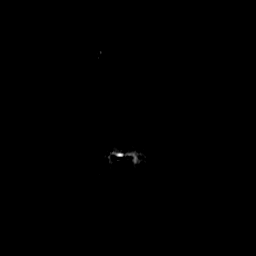

[((id)/(id)/1)-((id)/(id)/1) · axial · 3.0mm · 0.86mm/px · 1 of 64 slices shown (20 of 20)]
[im 1/64]
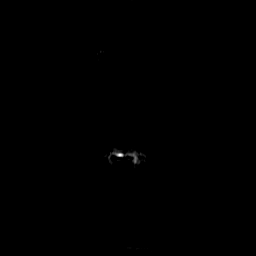

[33 of 54 positions shown; findings below may reference images not displayed]

FINDINGS: Prostate: Moderate central gland enlargement with multiple benign
prostatic hyperplasia nodules. Tiny focus of left lateral mid
peripheral zone T1 hyperintensity on image 16/series 6. Likely
related to hemorrhage.

No areas of peripheral zone T2 hypointensity to suggest
adenocarcinoma. No abnormal restricted diffusion or early
post-contrast enhancement.

Transcapsular spread:  Absent

Seminal vesicle involvement: Absent

Neurovascular bundle involvement: Absent

Pelvic adenopathy: Absent

Bone metastasis: Absent

Other findings: Bladder wall thickening, superimposed upon
underdistention.
IMPRESSION: 1. No evidence of prostate carcinoma.
2. Findings of benign prostatic hyperplasia, moderate. Urinary
bladder wall thickening is likely secondary and related to a
component of bladder outlet obstruction.

## 2016-05-04 ENCOUNTER — Other Ambulatory Visit: Payer: Self-pay | Admitting: Neurosurgery

## 2016-05-04 ENCOUNTER — Ambulatory Visit
Admission: RE | Admit: 2016-05-04 | Discharge: 2016-05-04 | Disposition: A | Discharge status: Home / Self Care | Payer: Medicare Other | Source: Ambulatory Visit | Attending: Neurosurgery | Admitting: Neurosurgery

## 2016-05-04 DIAGNOSIS — M5126 Other intervertebral disc displacement, lumbar region: Secondary | ICD-10-CM | POA: Diagnosis not present

## 2016-05-04 MED ORDER — METHYLPREDNISOLONE ACETATE 40 MG/ML INJ SUSP (RADIOLOG
120.0000 mg | Freq: Once | INTRAMUSCULAR | Status: AC
  Administered 2016-05-04: 120 mg via EPIDURAL

## 2016-05-04 MED ORDER — IOPAMIDOL (ISOVUE-M 200) INJECTION 41%
1.0000 mL | Freq: Once | INTRAMUSCULAR | Status: AC
  Administered 2016-05-04: 1 mL via EPIDURAL

## 2016-05-04 NOTE — Discharge Instructions (Signed)

## 2016-06-07 DIAGNOSIS — L570 Actinic keratosis: Secondary | ICD-10-CM | POA: Diagnosis not present

## 2016-09-02 DIAGNOSIS — Z23 Encounter for immunization: Secondary | ICD-10-CM | POA: Diagnosis not present

## 2016-09-02 DIAGNOSIS — E7800 Pure hypercholesterolemia, unspecified: Secondary | ICD-10-CM | POA: Diagnosis not present

## 2016-09-02 DIAGNOSIS — I10 Essential (primary) hypertension: Secondary | ICD-10-CM | POA: Diagnosis not present

## 2016-09-13 DIAGNOSIS — D485 Neoplasm of uncertain behavior of skin: Secondary | ICD-10-CM | POA: Diagnosis not present

## 2016-09-13 DIAGNOSIS — L821 Other seborrheic keratosis: Secondary | ICD-10-CM | POA: Diagnosis not present

## 2016-09-13 DIAGNOSIS — D692 Other nonthrombocytopenic purpura: Secondary | ICD-10-CM | POA: Diagnosis not present

## 2016-09-13 DIAGNOSIS — S1086XA Insect bite of other specified part of neck, initial encounter: Secondary | ICD-10-CM | POA: Diagnosis not present

## 2016-09-13 DIAGNOSIS — L570 Actinic keratosis: Secondary | ICD-10-CM | POA: Diagnosis not present

## 2016-11-01 DIAGNOSIS — R972 Elevated prostate specific antigen [PSA]: Secondary | ICD-10-CM | POA: Diagnosis not present

## 2016-11-08 DIAGNOSIS — R361 Hematospermia: Secondary | ICD-10-CM | POA: Diagnosis not present

## 2016-11-08 DIAGNOSIS — R3912 Poor urinary stream: Secondary | ICD-10-CM | POA: Diagnosis not present

## 2016-11-08 DIAGNOSIS — R972 Elevated prostate specific antigen [PSA]: Secondary | ICD-10-CM | POA: Diagnosis not present

## 2016-11-08 DIAGNOSIS — N401 Enlarged prostate with lower urinary tract symptoms: Secondary | ICD-10-CM | POA: Diagnosis not present

## 2016-12-26 DIAGNOSIS — I10 Essential (primary) hypertension: Secondary | ICD-10-CM | POA: Diagnosis not present

## 2016-12-26 DIAGNOSIS — R9431 Abnormal electrocardiogram [ECG] [EKG]: Secondary | ICD-10-CM | POA: Diagnosis not present

## 2016-12-26 DIAGNOSIS — R05 Cough: Secondary | ICD-10-CM | POA: Diagnosis not present

## 2016-12-26 DIAGNOSIS — R079 Chest pain, unspecified: Secondary | ICD-10-CM | POA: Diagnosis not present

## 2016-12-26 DIAGNOSIS — J069 Acute upper respiratory infection, unspecified: Secondary | ICD-10-CM | POA: Diagnosis not present

## 2017-02-21 DIAGNOSIS — J309 Allergic rhinitis, unspecified: Secondary | ICD-10-CM | POA: Diagnosis not present

## 2017-03-08 DIAGNOSIS — H2513 Age-related nuclear cataract, bilateral: Secondary | ICD-10-CM | POA: Diagnosis not present

## 2017-03-08 DIAGNOSIS — H40013 Open angle with borderline findings, low risk, bilateral: Secondary | ICD-10-CM | POA: Diagnosis not present

## 2017-03-17 DIAGNOSIS — D1801 Hemangioma of skin and subcutaneous tissue: Secondary | ICD-10-CM | POA: Diagnosis not present

## 2017-03-17 DIAGNOSIS — L570 Actinic keratosis: Secondary | ICD-10-CM | POA: Diagnosis not present

## 2017-03-17 DIAGNOSIS — D225 Melanocytic nevi of trunk: Secondary | ICD-10-CM | POA: Diagnosis not present

## 2017-03-17 DIAGNOSIS — Z85828 Personal history of other malignant neoplasm of skin: Secondary | ICD-10-CM | POA: Diagnosis not present

## 2017-03-17 DIAGNOSIS — L821 Other seborrheic keratosis: Secondary | ICD-10-CM | POA: Diagnosis not present

## 2017-04-07 DIAGNOSIS — I10 Essential (primary) hypertension: Secondary | ICD-10-CM | POA: Diagnosis not present

## 2017-04-07 DIAGNOSIS — R05 Cough: Secondary | ICD-10-CM | POA: Diagnosis not present

## 2017-04-07 DIAGNOSIS — J22 Unspecified acute lower respiratory infection: Secondary | ICD-10-CM | POA: Diagnosis not present

## 2017-04-07 DIAGNOSIS — R509 Fever, unspecified: Secondary | ICD-10-CM | POA: Diagnosis not present

## 2017-05-30 DIAGNOSIS — L570 Actinic keratosis: Secondary | ICD-10-CM | POA: Diagnosis not present

## 2017-06-16 DIAGNOSIS — H401131 Primary open-angle glaucoma, bilateral, mild stage: Secondary | ICD-10-CM | POA: Diagnosis not present

## 2017-08-29 DIAGNOSIS — H2513 Age-related nuclear cataract, bilateral: Secondary | ICD-10-CM | POA: Diagnosis not present

## 2017-08-29 DIAGNOSIS — H40013 Open angle with borderline findings, low risk, bilateral: Secondary | ICD-10-CM | POA: Diagnosis not present

## 2017-08-29 DIAGNOSIS — H43813 Vitreous degeneration, bilateral: Secondary | ICD-10-CM | POA: Diagnosis not present

## 2017-08-29 DIAGNOSIS — H25043 Posterior subcapsular polar age-related cataract, bilateral: Secondary | ICD-10-CM | POA: Diagnosis not present

## 2017-10-03 DIAGNOSIS — Z23 Encounter for immunization: Secondary | ICD-10-CM | POA: Diagnosis not present

## 2017-10-03 DIAGNOSIS — I10 Essential (primary) hypertension: Secondary | ICD-10-CM | POA: Diagnosis not present

## 2017-10-03 DIAGNOSIS — I350 Nonrheumatic aortic (valve) stenosis: Secondary | ICD-10-CM | POA: Diagnosis not present

## 2017-10-03 DIAGNOSIS — Z0000 Encounter for general adult medical examination without abnormal findings: Secondary | ICD-10-CM | POA: Diagnosis not present

## 2017-10-03 DIAGNOSIS — E7800 Pure hypercholesterolemia, unspecified: Secondary | ICD-10-CM | POA: Diagnosis not present

## 2017-10-21 DIAGNOSIS — R972 Elevated prostate specific antigen [PSA]: Secondary | ICD-10-CM | POA: Diagnosis not present

## 2017-11-15 DIAGNOSIS — R3912 Poor urinary stream: Secondary | ICD-10-CM | POA: Diagnosis not present

## 2017-11-15 DIAGNOSIS — R972 Elevated prostate specific antigen [PSA]: Secondary | ICD-10-CM | POA: Diagnosis not present

## 2017-11-15 DIAGNOSIS — N401 Enlarged prostate with lower urinary tract symptoms: Secondary | ICD-10-CM | POA: Diagnosis not present

## 2017-11-15 DIAGNOSIS — L570 Actinic keratosis: Secondary | ICD-10-CM | POA: Diagnosis not present

## 2017-11-16 DIAGNOSIS — H401131 Primary open-angle glaucoma, bilateral, mild stage: Secondary | ICD-10-CM | POA: Diagnosis not present

## 2018-01-04 ENCOUNTER — Telehealth: Payer: Self-pay

## 2018-01-04 NOTE — Telephone Encounter (Signed)
Sent notes from [facility] to scheduling.

## 2018-01-06 ENCOUNTER — Telehealth: Payer: Self-pay | Admitting: Cardiology

## 2018-01-06 NOTE — Telephone Encounter (Signed)
New message  Pt verbalized that he is calling for the RN  Pt is wanting to have an Echo

## 2018-01-06 NOTE — Telephone Encounter (Signed)
Left message for pt to c/b to schedule a f/u appt.  He was due 01/2017 and has not been seen.

## 2018-01-11 ENCOUNTER — Ambulatory Visit (INDEPENDENT_AMBULATORY_CARE_PROVIDER_SITE_OTHER): Payer: Medicare Other | Admitting: Cardiology

## 2018-01-11 ENCOUNTER — Encounter: Payer: Self-pay | Admitting: Cardiology

## 2018-01-11 VITALS — BP 128/64 | HR 73 | Ht 68.0 in | Wt 207.4 lb

## 2018-01-11 DIAGNOSIS — I351 Nonrheumatic aortic (valve) insufficiency: Secondary | ICD-10-CM

## 2018-01-11 DIAGNOSIS — R0600 Dyspnea, unspecified: Secondary | ICD-10-CM

## 2018-01-11 DIAGNOSIS — Z8679 Personal history of other diseases of the circulatory system: Secondary | ICD-10-CM | POA: Diagnosis not present

## 2018-01-11 NOTE — Progress Notes (Addendum)
[facility]. [address], [place]  [address] Phone: [phone] Fax:  [phone]  Date:  01/11/2018   ID:  Eric Chambers, DOB [birth year], MRN [id number]  PCP:  Lawerance Cruel, MD   History of Present Illness: Eric Chambers is a 72 y.o. male with history of endocarditis, previously in [place], with admission August 2014 for presumed endocarditis. Transesophageal echocardiogram was performed which demonstrated fingerlike projections surrounding the tricuspid valve, possible vegetation. Repeat limited echocardiogram was performed in October of 2014 which demonstrated similar findings.   I reviewed infectious disease notes that he has completed a course of IV antibiotics and is continuing on PO Cipro. Daptomycin. Had reaction to Vanco and ceftraxone.   ?cracking of hands manifestation of endocarditis. He has battled with dry cracked hands for several years. His prior dermatologist in [place] tried everything he states.  Takes dental abx.    04/01/15-overall doing well. No change clinically. No chest pain. No significant change in shortness of breath. His weight has increased a few pounds over the past few years.  02/05/16-overall doing well. Sometimes feels fatigued but he is resting well. No shortness of breath, no chest pain. Still battling with dry cracked hands from most his entire life he states.  01/11/18-overall been feeling fairly well.  When he does walk he notices some shortness of breath and his heart rate increases over 100 he states.  No chest pain fevers chills nausea vomiting syncope.  Tolerating his low-dose statin well.  Tolerating his blood pressure medication well.  Wt Readings from Last 3 Encounters:  01/11/18 207 lb 6.4 oz (94.1 kg)  02/05/16 206 lb 12.8 oz (93.8 kg)  04/01/15 208 lb (94.3 kg)     Past Medical History:  Diagnosis Date  . Aortic valve regurgitation   . Endocarditis   . Endocarditis    Possible vegetation associated with  tricuspid valve (seen on post tx echo as well)   . Enlarged prostate   . Heart murmur    "think I have one now" (08/16/2013)  . High cholesterol   . Hypertension   . Pneumonia    "almost once/yr" (08/16/2013)  . Sleep apnea    "don't wear mask" (08/16/2013)    Past Surgical History:  Procedure Laterality Date  . INGUINAL HERNIA REPAIR Right ~ 1950  . TEE WITHOUT CARDIOVERSION N/A 08/17/2013   Procedure: TRANSESOPHAGEAL ECHOCARDIOGRAM (TEE);  Surgeon: Candee Furbish, MD;  Location: [facility] ENDOSCOPY;  Service: Cardiovascular;  Laterality: N/A;  . TONSILLECTOMY  ~ 1957  . UMBILICAL HERNIA REPAIR  ~ 2011    Current Outpatient Medications  Medication Sig Dispense Refill  . atorvastatin (LIPITOR) 10 MG tablet Take 1 tablet (10 mg total) by mouth daily. 30 tablet 5  . ibuprofen (ADVIL) 200 MG tablet Take 600 mg by mouth every 6 (six) hours as needed for moderate pain.    Marland Kitchen LUMIGAN 0.01 % SOLN Place 1 drop into both eyes daily.    Marland Kitchen telmisartan (MICARDIS) 40 MG tablet Take 40 mg by mouth daily.  2   No current facility-administered medications for this visit.     Allergies:   No Known Allergies  Social History:  The patient  reports that  has never smoked. he has never used smokeless tobacco. He reports that he drinks about 1.2 oz of alcohol per week. He reports that he does not use drugs.   ROS:  Please see the history of present illness.   No fever  chills nausea vomiting syncope bleeding  PHYSICAL EXAM: VS:  BP 128/64   Pulse 73   Ht 5\' 8"  (1.727 m)   Wt 207 lb 6.4 oz (94.1 kg)   SpO2 96%   BMI 31.54 kg/m  Well nourished, well developed, in no acute distress, normal HEENT: normal  Neck: no JVD  Cardiac:  normal S1, S2; RRR; 1/6 diastolic slight musical murmur once again Lungs: Clear to auscultation bilaterally, no wheezing, rhonchi or rales  Abd: soft, nontender, no hepatomegaly  Ext: No edema Skin: warm and dry  Neuro: no focal abnormalities noted     EKG: EKG ordered today.  EKG done today 01/11/18 - NSR, LAD personally viewed, prior normal sinus rhythm, 67, left axis deviation 04/01/15-sinus rhythm, 73, left axis deviation.  ECHO 2017:  - Left ventricle: The cavity size was normal. Wall thickness was   increased in a pattern of mild LVH. Systolic function was normal.   The estimated ejection fraction was in the range of 55% to 60%.   Wall motion was normal; there were no regional wall motion   abnormalities. Doppler parameters are consistent with abnormal   left ventricular relaxation (grade 1 diastolic dysfunction). - Aortic valve: There was moderate regurgitation. - Aortic root: The aortic root was mildly dilated. - Atrial septum: There was an atrial septal aneurysm.  Impressions:  - Normal LV systolic function; mild LVH; grade 1 diastolic   dysfunction; thickened aortic valve with eccentric AI difficult   to quantitate but likely moderate; trace TR,.  ASSESSMENT AND PLAN:  1. History of Endocarditis- overall stable, no fevers. No significant change in endurance. Sometimes feels tired. He is able to feed the horses well. I reviewed Dr. Tommy Medal note. Cultures were negative. Tricuspid valve still demonstrates strand-like projections. I will repeat echocardiogram. Interesting hand manifestations with cracking in his fingertips developed prior to both episodes of endocarditis. We will continue to monitor. 2. Aortic regurgitation-soft musical murmur heard on exam. Interesting. Diastolic. Has been feeling dyspnea with exertion and HR increases when walking over 100.  Will repeat ECHO.  3. Hyperlipidemia-Lipitor 10mg   since hospitalization/antibiotics. Dr. Harrington Challenger is following lipid panel. Calf pain with 20. 4. Essential hypertension-currently under good control.  5. We'll see him back in 12 months or sooner if symptoms manifested themselves.  Signed, Candee Furbish, MD [facility]  01/11/2018 9:13 AM

## 2018-01-11 NOTE — Patient Instructions (Signed)

## 2018-01-11 NOTE — Telephone Encounter (Signed)
Pt seen in clinic today.  Echo has been ordered. He should f/u in 1 year as long as echo is normal/stable.

## 2018-01-12 ENCOUNTER — Other Ambulatory Visit: Payer: Self-pay

## 2018-01-12 ENCOUNTER — Ambulatory Visit (HOSPITAL_COMMUNITY): Payer: Medicare Other | Attending: Cardiology

## 2018-01-12 DIAGNOSIS — R0600 Dyspnea, unspecified: Secondary | ICD-10-CM | POA: Diagnosis not present

## 2018-01-12 DIAGNOSIS — I351 Nonrheumatic aortic (valve) insufficiency: Secondary | ICD-10-CM | POA: Diagnosis not present

## 2018-01-12 DIAGNOSIS — I371 Nonrheumatic pulmonary valve insufficiency: Secondary | ICD-10-CM | POA: Insufficient documentation

## 2018-01-16 ENCOUNTER — Encounter: Payer: Self-pay | Admitting: Cardiology

## 2018-01-16 ENCOUNTER — Ambulatory Visit (INDEPENDENT_AMBULATORY_CARE_PROVIDER_SITE_OTHER): Payer: Medicare Other | Admitting: Cardiology

## 2018-01-16 VITALS — BP 130/66 | HR 79 | Ht 68.0 in | Wt 209.2 lb

## 2018-01-16 DIAGNOSIS — Z8679 Personal history of other diseases of the circulatory system: Secondary | ICD-10-CM | POA: Diagnosis not present

## 2018-01-16 DIAGNOSIS — I351 Nonrheumatic aortic (valve) insufficiency: Secondary | ICD-10-CM

## 2018-01-16 NOTE — Patient Instructions (Addendum)
Medication Instructions:  The current medical regimen is effective;  continue present plan and medications.  You have been referred to [facility] for further evaluation and need for Aortic Valve Replacement.  Follow-Up: Follow up with Dr Marlou Porch to be determine after surgery.  If you need a refill on your cardiac medications before your next appointment, please call your pharmacy.  Thank you for choosing [facility]!!

## 2018-01-16 NOTE — Progress Notes (Signed)
[facility]. [address], [address] [place] [place], [place]  [address] Phone: [phone] Fax:  [phone]  Date:  01/16/2018   ID:  Eric Chambers, DOB [birth year], MRN [id number]  PCP:  Lawerance Cruel, MD   History of Present Illness: Eric Chambers is a 72 y.o. male with history of endocarditis, previously in [place], with admission August 2014 for presumed endocarditis. Transesophageal echocardiogram was performed which demonstrated fingerlike projections surrounding the tricuspid valve, possible vegetation. Repeat limited echocardiogram was performed in October of 2014 which demonstrated similar findings. He has had moderate aortic regurgitation.   I reviewed infectious disease notes that he has completed a course of IV antibiotics and is continuing on PO Cipro. Daptomycin. Had reaction to Vanco and ceftraxone.   ?cracking of hands manifestation of endocarditis. He has battled with dry cracked hands for several years. His prior dermatologist in [place] tried everything he states.  Takes dental abx.    04/01/15-overall doing well. No change clinically. No chest pain. No significant change in shortness of breath. His weight has increased a few pounds over the past few years.  02/05/16-overall doing well. Sometimes feels fatigued but he is resting well. No shortness of breath, no chest pain. Still battling with dry cracked hands from most his entire life he states.  01/11/18-overall been feeling fairly well.  When he does walk he notices some shortness of breath and his heart rate increases over 100 he states.  No chest pain fevers chills nausea vomiting syncope.  Tolerating his low-dose statin well.  Tolerating his blood pressure medication well.  01/16/18-echocardiogram performed demonstrated aortic regurgitation more in the severe category with pressure half-time approximately 250 ms.  It seems to have advanced from prior.  Given his increasing dyspnea, is here today to discuss potential  valve replacement. Willing to discuss with surgery team.   Wt Readings from Last 3 Encounters:  01/16/18 209 lb 3.2 oz (94.9 kg)  01/11/18 207 lb 6.4 oz (94.1 kg)  02/05/16 206 lb 12.8 oz (93.8 kg)     Past Medical History:  Diagnosis Date  . Aortic valve regurgitation   . Endocarditis   . 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 Enlarged prostate   . Heart murmur    "think I have one now" (08/16/2013)  . High cholesterol   . Hypertension   . Pneumonia    "almost once/yr" (08/16/2013)  . Sleep apnea    "don't wear mask" (08/16/2013)    Past Surgical History:  Procedure Laterality Date  . INGUINAL HERNIA REPAIR Right ~ 1950  . TEE WITHOUT CARDIOVERSION N/A 08/17/2013   Procedure: TRANSESOPHAGEAL ECHOCARDIOGRAM (TEE);  Surgeon: Candee Furbish, MD;  Location: [facility] ENDOSCOPY;  Service: Cardiovascular;  Laterality: N/A;  . TONSILLECTOMY  ~ 1957  . UMBILICAL HERNIA REPAIR  ~ 2011    Current Outpatient Medications  Medication Sig Dispense Refill  . atorvastatin (LIPITOR) 10 MG tablet Take 1 tablet (10 mg total) by mouth daily. 30 tablet 5  . ibuprofen (ADVIL) 200 MG tablet Take 600 mg by mouth every 6 (six) hours as needed for moderate pain.    Marland Kitchen LUMIGAN 0.01 % SOLN Place 1 drop into both eyes daily.    Marland Kitchen telmisartan (MICARDIS) 40 MG tablet Take 40 mg by mouth daily.  2   No current facility-administered medications for this visit.     Allergies:   No Known Allergies  Social History:  The patient  reports that  has never smoked. he has never used smokeless tobacco. He reports that he drinks about 1.2 oz of alcohol per week. He reports that he does not use drugs.   ROS:  Please see the history of present illness.   No fever chills nausea vomiting syncope bleeding  PHYSICAL EXAM: VS:  BP 130/66   Pulse 79   Ht 5\' 8"  (1.727 m)   Wt 209 lb 3.2 oz (94.9 kg)   SpO2 97%   BMI 31.81 kg/m  Well nourished, well developed,  in no acute distress, normal HEENT: normal  Neck: no JVD  Cardiac:  normal S1, S2; RRR; 1/6 diastolic slight musical murmur once again Lungs: Clear to auscultation bilaterally, no wheezing, rhonchi or rales  Abd: soft, nontender, no hepatomegaly  Ext: No edema Skin: warm and dry  Neuro: no focal abnormalities noted     EKG: EKG ordered today. EKG done today 01/11/18 - NSR, LAD personally viewed, prior normal sinus rhythm, 67, left axis deviation 04/01/15-sinus rhythm, 73, left axis deviation.  ECHO 2017:  - Left ventricle: The cavity size was normal. Wall thickness was   increased in a pattern of mild LVH. Systolic function was normal.   The estimated ejection fraction was in the range of 55% to 60%.   Wall motion was normal; there were no regional wall motion   abnormalities. Doppler parameters are consistent with abnormal   left ventricular relaxation (grade 1 diastolic dysfunction). - Aortic valve: There was moderate regurgitation. - Aortic root: The aortic root was mildly dilated. - Atrial septum: There was an atrial septal aneurysm.  Impressions:  - Normal LV systolic function; mild LVH; grade 1 diastolic   dysfunction; thickened aortic valve with eccentric AI difficult   to quantitate but likely moderate; trace TR,.  ECHO 01/12/18: - Normal LVF, mild to moderate aortic annular calcification with   mild AVSC, moderate to severe AR with PHT 251msec eccentric   towards the AMVL, mildly dilated aortic root, G1DD. Compared to   echo 2017, the AR appears to have progressed. Consider TEE for   further evaluation.  ASSESSMENT AND PLAN:  1. History of Endocarditis- No further issues, overall stable, no fevers. Sometimes feels tired. He is able to feed the horses well. I reviewed Dr. Tommy Medal note. Cultures were negative. Tricuspid valve still demonstrates strand-like projections. Interesting hand manifestations with cracking in his fingertips developed prior to both episodes of  endocarditis.  He has had no further bouts of endocarditis. 2. Aortic regurgitation- severe aortic regurgitation now noted.   Soft musical murmur heard on exam. Diastolic. Has been feeling dyspnea with exertion and increased fatigue and HR increases when walking to over 100.  I think it is time for [place] to refer him to cardiothoracic surgery for symptomatic severe aortic regurgitation.  Echo images are adequate, I do not feel strongly that a repeat transesophageal echocardiogram is necessary at this time.  He will need a cardiac catheterization if he chooses to move forward with surgery.   3. Hyperlipidemia-Lipitor 10mg   since hospitalization/antibiotics. Dr. Harrington Challenger is following lipid panel. Calf pain with 20mg . 4. Essential hypertension-currently under good control.  No changes made 5. We'll see him back after surgery team direction.  Signed, Candee Furbish, MD [facility]  01/16/2018 11:11 AM

## 2018-02-01 ENCOUNTER — Institutional Professional Consult (permissible substitution) (INDEPENDENT_AMBULATORY_CARE_PROVIDER_SITE_OTHER): Payer: Medicare Other | Admitting: Surgery

## 2018-02-01 VITALS — BP 125/66 | HR 76 | Resp 20 | Ht 68.0 in | Wt 207.0 lb

## 2018-02-01 DIAGNOSIS — I351 Nonrheumatic aortic (valve) insufficiency: Secondary | ICD-10-CM | POA: Diagnosis not present

## 2018-02-03 ENCOUNTER — Encounter: Payer: Self-pay | Admitting: Surgery

## 2018-02-03 NOTE — Progress Notes (Signed)
Cardiothoracic Surgery Consultation  PCP is Lawerance Cruel, MD Referring Provider is Jerline Pain, MD  Chief Complaint  Patient presents with  . Aortic Insuffiency    Surgical eval, ECHO 01/12/2018, HX of Endcarditis    HPI:  The patient is a 72 year old gentleman with a history of hypertension and hypercholesterolemia who has residences in [place] as well as [place].  He reports that in 2004-2005 he was diagnosed with endocarditis but is not sure which valve it was.  He was treated with a prolonged course of intravenous antibiotics via PICC line.  He completely recovered and there is no clear source although he had had some dental work done just prior to that episode.  Then in August 2014 he was treated here for presumed endocarditis.  An echo at that time showed moderate to severe aortic insufficiency directed eccentrically into the LVOT along the septum.  There was an echogenic strand-like object in the right ventricle associated with the lateral aspect the tricuspid valve that was felt to be concerning for vegetation.  The mitral valve was unremarkable with trivial MR.  He was treated with antibiotics on the direction of ID and apparently no organism was ever identified.  He reportedly had a reaction to vancomycin and ceftriaxone and was treated with Cubicin. A follow-up echocardiogram on 10/05/2013 was essentially unchanged with this strand-like objects attached to the tricuspid valve chordae.  He had a follow-up echocardiogram on 04/08/2016 which showed trivial tricuspid regurgitation and no evidence of the previously noted strand-like object attached to the tricuspid valve chordae.  He has continued to do fairly well but has noted development of exertional fatigue and shortness of breath with moderate activity.  His most recent echo on 01/12/2018 shows a trileaflet aortic valve with mild calcification and thickening of the leaflets.  There was moderate to severe regurgitation with  a pressure half-time of 255 ms.  Left ventricular ejection fraction was 60-65%.  Left ventricular internal dimensions were normal.  The mitral and tricuspid valves were unremarkable.  He is here with his wife today.  He is originally from [place] but bought a house in [place] when 1 of his children moved here.  He flies back to [place] at least once per month to check on his house is there and to attend a meeting of the [employer].  Past Medical History:  Diagnosis Date  . Aortic valve regurgitation   . Endocarditis   . 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 Enlarged prostate   . Heart murmur    "think I have one now" (08/16/2013)  . High cholesterol   . Hypertension   . Pneumonia    "almost once/yr" (08/16/2013)  . Sleep apnea    "don't wear mask" (08/16/2013)    Past Surgical History:  Procedure Laterality Date  . INGUINAL HERNIA REPAIR Right ~ 1950  . TEE WITHOUT CARDIOVERSION N/A 08/17/2013   Procedure: TRANSESOPHAGEAL ECHOCARDIOGRAM (TEE);  Surgeon: Candee Furbish, MD;  Location: [facility] ENDOSCOPY;  Service: Cardiovascular;  Laterality: N/A;  . TONSILLECTOMY  ~ 1957  . UMBILICAL HERNIA REPAIR  ~ 2011    Family History  Problem Relation Age of Onset  . Hypertension Mother   . Heart attack Father 6    Social History Social History   Tobacco Use  . Smoking status: Never Smoker  . Smokeless tobacco: Never Used  Substance Use Topics  . Alcohol  use: Yes    Alcohol/week: 1.2 oz    Types: 2 Glasses of wine per week  . Drug use: No    Current Outpatient Medications  Medication Sig Dispense Refill  . atorvastatin (LIPITOR) 10 MG tablet Take 1 tablet (10 mg total) by mouth daily. 30 tablet 5  . ibuprofen (ADVIL) 200 MG tablet Take 600 mg by mouth every 6 (six) hours as needed for moderate pain.    Marland Kitchen LUMIGAN 0.01 % SOLN Place 1 drop into both eyes daily.    Marland Kitchen telmisartan (MICARDIS) 40 MG tablet  Take 40 mg by mouth daily.  2   No current facility-administered medications for this visit.     No Known Allergies  Review of Systems  Constitutional: Positive for fatigue. Negative for activity change, appetite change, chills and fever.  HENT: Negative.  Negative for dental problem.   Eyes: Negative.   Respiratory: Positive for shortness of breath.        With exertion  Cardiovascular: Negative for chest pain, palpitations and leg swelling.  Gastrointestinal: Negative.   Endocrine: Negative.   Genitourinary: Negative.   Musculoskeletal: Negative.   Skin: Negative.   Allergic/Immunologic: Negative.   Neurological: Negative.   Hematological: Negative.   Psychiatric/Behavioral: Negative.     BP 125/66   Pulse 76   Resp 20   Ht 5\' 8"  (1.727 m)   Wt 207 lb (93.9 kg)   SpO2 96% Comment: RA  BMI 31.47 kg/m  Physical Exam  Constitutional: He is oriented to person, place, and time. He appears well-developed and well-nourished. No distress.  HENT:  Head: Normocephalic and atraumatic.  Mouth/Throat: Oropharynx is clear and moist.  Eyes: Conjunctivae and EOM are normal. Pupils are equal, round, and reactive to light.  Neck: Normal range of motion. Neck supple. No JVD present. No thyromegaly present.  Cardiovascular: Normal rate, regular rhythm and intact distal pulses.  Murmur heard. 2/6 high pitched diastolic murmur  Pulmonary/Chest: Effort normal and breath sounds normal. No respiratory distress. He has no rales.  Abdominal: Soft. Bowel sounds are normal. He exhibits no distension. There is no tenderness.  Musculoskeletal: Normal range of motion. He exhibits no edema.  Lymphadenopathy:    He has no cervical adenopathy.  Neurological: He is alert and oriented to person, place, and time. He has normal strength. No cranial nerve deficit or sensory deficit.  Skin: Skin is warm and dry.  Psychiatric: He has a normal mood and affect.     Diagnostic Tests:        [facility]                        [address], [place] [address]                            [phone]  ------------------------------------------------------------------- Transthoracic Echocardiography  Patient:    Tramell, Piechota MR #:       [id number] Study Date: 01/12/2018 Gender:     M Age:        68 Height:     172.7 cm Weight:     94.1 kg BSA:        2.15 m^2 Pt. Status: Room:   ATTENDING    Fransico Him, MD  SONOGRAPHER  Blanch Media  Partee, RDCS  REFERRING    Maia Plan, M.D.  REFERRING    Candee Furbish, M.D.  REFERRING    Melinda Crutch  PERFORMING   [facility], Outpatient  cc:  ------------------------------------------------------------------- LV EF: 60% -   65%  ------------------------------------------------------------------- Indications:      AI (I35.1).  ------------------------------------------------------------------- History:   PMH:  Acquired from the patient and from the patient&'s chart.  Dyspnea.  Aortic valve disease.  Risk factors:  Sleep apnea. Hypertension. Dyslipidemia.  ------------------------------------------------------------------- Study Conclusions  - Left ventricle: The cavity size was normal. Systolic function was   normal. The estimated ejection fraction was in the range of 60%   to 65%. Wall motion was normal; there were no regional wall   motion abnormalities. There was an increased relative   contribution of atrial contraction to ventricular filling.   Doppler parameters are consistent with abnormal left ventricular   relaxation (grade 1 diastolic dysfunction). - Aortic valve: Mildly to moderately calcified annulus. Trileaflet;   mildly thickened, mildly calcified leaflets. There was moderate   to severe regurgitation directed eccentrically in the LVOT and   towards the mitral anterior leaflet. Regurgitation pressure   half-time: 255 ms. - Aorta: Aortic root  dimension: 38 mm (ED). - Aortic root: The aortic root was mildly dilated. - Pulmonic valve: Poorly visualized. - Pulmonary arteries: Systolic pressure could not be accurately   estimated.  Impressions:  - Normal LVF, mild to moderate aortic annular calcification with   mild AVSC, moderate to severe MR with PHT 217msec eccentric   towards the AMVL, mildly dilated aortic root, G1DD. Compared to   echo 2017, the AR appears to have progressed. Consider TEE for   further evaluation.  ------------------------------------------------------------------- Study data:  Comparison was made to the study of April 2017.  Study status:  Routine.  Procedure:  The patient reported no pain pre or post test. Transthoracic echocardiography for left ventricular function evaluation and for assessment of valvular function. Image quality was adequate.  Study completion:  There were no complications.          Transthoracic echocardiography.  M-mode, complete 2D, spectral Doppler, and color Doppler.  Birthdate: Patient birthdate: 17-Dec-1946.  Age:  Patient is 72 yr old.  Sex: Gender: male.    BMI: 31.5 kg/m^2.  Blood pressure:     128/64 Patient status:  Outpatient.  Study date:  Study date: 01/12/2018. Study time: 07:39 AM.  Location:  [facility]  -------------------------------------------------------------------  ------------------------------------------------------------------- Left ventricle:  The cavity size was normal. Systolic function was normal. The estimated ejection fraction was in the range of 60% to 65%. Wall motion was normal; there were no regional wall motion abnormalities. There was an increased relative contribution of atrial contraction to ventricular filling. Doppler parameters are consistent with abnormal left ventricular relaxation (grade 1 diastolic dysfunction).  ------------------------------------------------------------------- Aortic valve:   Mildly to moderately  calcified annulus. Trileaflet; mildly thickened, mildly calcified leaflets. Mobility was not restricted.  Doppler:  Transvalvular velocity was within the normal range. There was no stenosis. There was moderate to severe regurgitation directed eccentrically in the LVOT and towards the mitral anterior leaflet.  ------------------------------------------------------------------- Aorta:  Aortic root: The aortic root was mildly dilated.  ------------------------------------------------------------------- Mitral valve:   Structurally normal valve.   Mobility was not restricted.  Doppler:  Transvalvular velocity was within the normal range. There was no evidence for stenosis. There was no regurgitation.  ------------------------------------------------------------------- Left atrium:  The  atrium was normal in size.  ------------------------------------------------------------------- Right ventricle:  The cavity size was normal. Wall thickness was normal. Systolic function was normal.  ------------------------------------------------------------------- Pulmonic valve:   Poorly visualized.  Structurally normal valve. Cusp separation was normal.  Doppler:  Transvalvular velocity was within the normal range. There was no evidence for stenosis. There was mild to moderate regurgitation.  ------------------------------------------------------------------- Tricuspid valve:   Structurally normal valve.    Doppler: Transvalvular velocity was within the normal range. There was no regurgitation.  ------------------------------------------------------------------- Pulmonary artery:   The main pulmonary artery was normal-sized. Systolic pressure could not be accurately estimated.  ------------------------------------------------------------------- Right atrium:  The atrium was normal in size.  ------------------------------------------------------------------- Pericardium:  There was no  pericardial effusion.  ------------------------------------------------------------------- Systemic veins: Inferior vena cava: The vessel was normal in size.  ------------------------------------------------------------------- Measurements   Left ventricle                             Value        Reference  LV ID, ED, PLAX chordal                    44    mm     43 - 52  LV ID, ES, PLAX chordal                    24    mm     23 - 38  LV fx shortening, PLAX chordal             45    %      >=29  LV PW thickness, ED                        11    mm     ----------  IVS/LV PW ratio, ED                        0.82         <=1.3  Stroke volume, 2D                          87    ml     ----------  Stroke volume/bsa, 2D                      40    ml/m^2 ----------  LV e&', lateral                             5.11  cm/s   ----------  LV E/e&', lateral                           12.25        ----------  LV e&', medial                              4.9   cm/s   ----------  LV E/e&', medial                            12.78        ----------  LV e&', average  5.01  cm/s   ----------  LV E/e&', average                           12.51        ----------    Ventricular septum                         Value        Reference  IVS thickness, ED                          9     mm     ----------    LVOT                                       Value        Reference  LVOT ID, S                                 21    mm     ----------  LVOT area                                  3.46  cm^2   ----------  LVOT peak velocity, S                      114   cm/s   ----------  LVOT mean velocity, S                      72.4  cm/s   ----------  LVOT VTI, S                                25.1  cm     ----------  LVOT peak gradient, S                      5     mm Hg  ----------    Aortic valve                               Value        Reference  Aortic regurg pressure half-time            255   ms     ----------    Aorta                                      Value        Reference  Aortic root ID, ED                         38    mm     ----------    Left atrium                                Value  Reference  LA ID, A-P, ES                             38    mm     ----------  LA ID/bsa, A-P                             1.76  cm/m^2 <=2.2  LA volume, S                               36.3  ml     ----------  LA volume/bsa, S                           16.9  ml/m^2 ----------  LA volume, ES, 1-p A4C                     25.6  ml     ----------  LA volume/bsa, ES, 1-p A4C                 11.9  ml/m^2 ----------  LA volume, ES, 1-p A2C                     43.3  ml     ----------  LA volume/bsa, ES, 1-p A2C                 20.1  ml/m^2 ----------    Mitral valve                               Value        Reference  Mitral E-wave peak velocity                62.6  cm/s   ----------  Mitral A-wave peak velocity                90.2  cm/s   ----------  Mitral E/A ratio, peak                     0.7          ----------    Right atrium                               Value        Reference  RA ID, S-I, ES, A4C              (H)       50.8  mm     34 - 49  RA area, ES, A4C                           10.5  cm^2   8.3 - 19.5  RA volume, ES, A/L                         19.3  ml     ----------  RA volume/bsa, ES, A/L                     9     ml/m^2 ----------    Right  ventricle                            Value        Reference  TAPSE                                      22.9  mm     ----------  RV s&', lateral, S                          11.4  cm/s   ----------  Legend: (L)  and  (H)  mark values outside specified reference range.  ------------------------------------------------------------------- Prepared and Electronically Authenticated by  Fransico Him, MD 2019-01-24T09:02:38   Impression:  This 72 year old gentleman has severe, symptomatic aortic insufficiency with [place] Heart Association class II symptoms of exertional fatigue and shortness of breath consistent with chronic diastolic congestive heart failure.  His aortic insufficiency appears to have worsened over the past 2 years and the severe range with an AI pressure half-time of 255 ms.  He has a history of endocarditis x2 in the past although the exact details are unclear and I am not sure which valves were involved.  He did have some abnormality of the tricuspid valve in the past which appears to have resolve and I am not sure if he ever had any problem with the aortic valve.  I think aortic valve replacement is indicated at this time to prevent further progression of symptoms and left ventricular deterioration.  I will plan to use a bioprosthetic valve given his age.  He will require preoperative cardiac catheterization and I would like to have his cardiac catheterization done as an outpatient at least a week prior to surgery since his last creatinine level in epic on 09/03/2013 was 1.75 with an estimated GFR of 39 suggesting stage III chronic kidney disease.  He will require a repeat BMET prior to cath to reassess this.  I reviewed his echocardiogram images with him and his wife and answered all their questions.  I discussed the operative procedure with the patient and his wife including alternatives, benefits and risks; including but not limited to bleeding, blood transfusion, infection, stroke, myocardial infarction, graft failure, heart block requiring a permanent pacemaker, organ dysfunction, and death.  Burna Cash understands and agrees to proceed.    Plan:  He is going to think about the timing of surgery and will call us back to schedule that.  We will schedule cardiac catheterization with cardiology to be done at least 1 week prior to the surgical date.   I spent 60 minutes performing this consultation and > 50% of this time was spent face to face counseling and coordinating the care of this patient's  moderate to severe symptomatic aortic insufficiency.   Gaye Pollack, MD [facility] [phone]

## 2018-02-14 DIAGNOSIS — I351 Nonrheumatic aortic (valve) insufficiency: Secondary | ICD-10-CM | POA: Diagnosis not present

## 2018-02-14 DIAGNOSIS — R972 Elevated prostate specific antigen [PSA]: Secondary | ICD-10-CM | POA: Diagnosis not present

## 2018-02-16 ENCOUNTER — Ambulatory Visit: Payer: Self-pay | Admitting: Cardiology

## 2018-02-21 DIAGNOSIS — R972 Elevated prostate specific antigen [PSA]: Secondary | ICD-10-CM | POA: Diagnosis not present

## 2018-02-21 DIAGNOSIS — N401 Enlarged prostate with lower urinary tract symptoms: Secondary | ICD-10-CM | POA: Diagnosis not present

## 2018-02-27 DIAGNOSIS — H25043 Posterior subcapsular polar age-related cataract, bilateral: Secondary | ICD-10-CM | POA: Diagnosis not present

## 2018-02-27 DIAGNOSIS — H40013 Open angle with borderline findings, low risk, bilateral: Secondary | ICD-10-CM | POA: Diagnosis not present

## 2018-02-27 DIAGNOSIS — H25013 Cortical age-related cataract, bilateral: Secondary | ICD-10-CM | POA: Diagnosis not present

## 2018-02-27 DIAGNOSIS — H2513 Age-related nuclear cataract, bilateral: Secondary | ICD-10-CM | POA: Diagnosis not present

## 2018-02-28 DIAGNOSIS — L570 Actinic keratosis: Secondary | ICD-10-CM | POA: Diagnosis not present

## 2018-03-20 DIAGNOSIS — N401 Enlarged prostate with lower urinary tract symptoms: Secondary | ICD-10-CM | POA: Diagnosis not present

## 2018-03-20 DIAGNOSIS — R972 Elevated prostate specific antigen [PSA]: Secondary | ICD-10-CM | POA: Diagnosis not present

## 2018-03-20 DIAGNOSIS — R361 Hematospermia: Secondary | ICD-10-CM | POA: Diagnosis not present

## 2018-03-20 DIAGNOSIS — R3912 Poor urinary stream: Secondary | ICD-10-CM | POA: Diagnosis not present

## 2018-03-27 DIAGNOSIS — Z8679 Personal history of other diseases of the circulatory system: Secondary | ICD-10-CM | POA: Diagnosis not present

## 2018-03-27 DIAGNOSIS — I10 Essential (primary) hypertension: Secondary | ICD-10-CM | POA: Diagnosis not present

## 2018-03-27 DIAGNOSIS — I351 Nonrheumatic aortic (valve) insufficiency: Secondary | ICD-10-CM | POA: Diagnosis not present

## 2018-05-16 DIAGNOSIS — H2513 Age-related nuclear cataract, bilateral: Secondary | ICD-10-CM | POA: Diagnosis not present

## 2018-05-16 DIAGNOSIS — H401131 Primary open-angle glaucoma, bilateral, mild stage: Secondary | ICD-10-CM | POA: Diagnosis not present

## 2018-06-20 DIAGNOSIS — I10 Essential (primary) hypertension: Secondary | ICD-10-CM | POA: Diagnosis not present

## 2018-06-20 DIAGNOSIS — Z7189 Other specified counseling: Secondary | ICD-10-CM | POA: Diagnosis not present

## 2018-06-20 DIAGNOSIS — Q231 Congenital insufficiency of aortic valve: Secondary | ICD-10-CM | POA: Diagnosis not present

## 2018-06-20 DIAGNOSIS — E7800 Pure hypercholesterolemia, unspecified: Secondary | ICD-10-CM | POA: Diagnosis not present

## 2018-08-28 DIAGNOSIS — D485 Neoplasm of uncertain behavior of skin: Secondary | ICD-10-CM | POA: Diagnosis not present

## 2018-08-28 DIAGNOSIS — Z85828 Personal history of other malignant neoplasm of skin: Secondary | ICD-10-CM | POA: Diagnosis not present

## 2018-08-28 DIAGNOSIS — L570 Actinic keratosis: Secondary | ICD-10-CM | POA: Diagnosis not present

## 2018-08-28 DIAGNOSIS — L821 Other seborrheic keratosis: Secondary | ICD-10-CM | POA: Diagnosis not present

## 2018-08-28 DIAGNOSIS — C4442 Squamous cell carcinoma of skin of scalp and neck: Secondary | ICD-10-CM | POA: Diagnosis not present

## 2018-08-28 DIAGNOSIS — D225 Melanocytic nevi of trunk: Secondary | ICD-10-CM | POA: Diagnosis not present

## 2018-08-28 DIAGNOSIS — H40013 Open angle with borderline findings, low risk, bilateral: Secondary | ICD-10-CM | POA: Diagnosis not present

## 2018-08-28 DIAGNOSIS — D1801 Hemangioma of skin and subcutaneous tissue: Secondary | ICD-10-CM | POA: Diagnosis not present

## 2018-08-28 DIAGNOSIS — L814 Other melanin hyperpigmentation: Secondary | ICD-10-CM | POA: Diagnosis not present

## 2018-08-28 DIAGNOSIS — H2513 Age-related nuclear cataract, bilateral: Secondary | ICD-10-CM | POA: Diagnosis not present

## 2018-08-29 DIAGNOSIS — H401131 Primary open-angle glaucoma, bilateral, mild stage: Secondary | ICD-10-CM | POA: Diagnosis not present

## 2018-09-04 DIAGNOSIS — C4442 Squamous cell carcinoma of skin of scalp and neck: Secondary | ICD-10-CM | POA: Diagnosis not present

## 2018-09-08 ENCOUNTER — Emergency Department (HOSPITAL_COMMUNITY)
Admission: EM | Admit: 2018-09-08 | Discharge: 2018-09-09 | Discharge status: Against Medical Advice | Payer: Medicare Other | Attending: Emergency Medicine | Admitting: Emergency Medicine

## 2018-09-08 ENCOUNTER — Encounter (HOSPITAL_COMMUNITY): Payer: Self-pay | Admitting: Emergency Medicine

## 2018-09-08 ENCOUNTER — Emergency Department (HOSPITAL_COMMUNITY): Payer: Medicare Other

## 2018-09-08 ENCOUNTER — Other Ambulatory Visit: Payer: Self-pay

## 2018-09-08 DIAGNOSIS — R55 Syncope and collapse: Secondary | ICD-10-CM | POA: Insufficient documentation

## 2018-09-08 DIAGNOSIS — Z5321 Procedure and treatment not carried out due to patient leaving prior to being seen by health care provider: Secondary | ICD-10-CM | POA: Insufficient documentation

## 2018-09-08 DIAGNOSIS — R05 Cough: Secondary | ICD-10-CM | POA: Diagnosis not present

## 2018-09-08 DIAGNOSIS — S199XXA Unspecified injury of neck, initial encounter: Secondary | ICD-10-CM | POA: Diagnosis not present

## 2018-09-08 DIAGNOSIS — S0990XA Unspecified injury of head, initial encounter: Secondary | ICD-10-CM | POA: Diagnosis not present

## 2018-09-08 HISTORY — DX: Malignant (primary) neoplasm, unspecified: C80.1

## 2018-09-08 LAB — URINALYSIS, ROUTINE W REFLEX MICROSCOPIC
Bilirubin Urine: NEGATIVE
GLUCOSE, UA: NEGATIVE mg/dL
HGB URINE DIPSTICK: NEGATIVE
Ketones, ur: NEGATIVE mg/dL
LEUKOCYTES UA: NEGATIVE
Nitrite: NEGATIVE
PROTEIN: NEGATIVE mg/dL
SPECIFIC GRAVITY, URINE: 1.02 (ref 1.005–1.030)
pH: 5 (ref 5.0–8.0)

## 2018-09-08 LAB — BASIC METABOLIC PANEL
Anion gap: 12 (ref 5–15)
BUN: 24 mg/dL — ABNORMAL HIGH (ref 8–23)
CO2: 21 mmol/L — AB (ref 22–32)
CREATININE: 1.29 mg/dL — AB (ref 0.61–1.24)
Calcium: 9.2 mg/dL (ref 8.9–10.3)
Chloride: 106 mmol/L (ref 98–111)
GFR calc non Af Amer: 54 mL/min — ABNORMAL LOW (ref >60)
Glucose, Bld: 153 mg/dL — ABNORMAL HIGH (ref 70–99)
Potassium: 4 mmol/L (ref 3.5–5.1)
SODIUM: 139 mmol/L (ref 135–145)

## 2018-09-08 LAB — CBC
HCT: 43.5 % (ref 39.0–52.0)
Hemoglobin: 14.4 g/dL (ref 13.0–17.0)
MCH: 30.5 pg (ref 26.0–34.0)
MCHC: 33.1 g/dL (ref 30.0–36.0)
MCV: 92.2 fL (ref 78.0–100.0)
Platelets: 212 10*3/uL (ref 150–400)
RBC: 4.72 MIL/uL (ref 4.22–5.81)
RDW: 12.7 % (ref 11.5–15.5)
WBC: 7.5 10*3/uL (ref 4.0–10.5)

## 2018-09-08 NOTE — ED Triage Notes (Signed)
Pt reports a syncopal fall after a coughing fit today. Pt has had a productive cough the last month with green mucous. Pt has been taking otc medication with relief. Pt hit his head on the carpet, skin tear to forehead. Denies any other injuries. Denies being on blood thinners. Denies pain, reports stiff neck.

## 2018-09-09 ENCOUNTER — Emergency Department (HOSPITAL_COMMUNITY): Payer: Medicare Other

## 2018-09-09 DIAGNOSIS — R55 Syncope and collapse: Secondary | ICD-10-CM | POA: Diagnosis not present

## 2018-09-09 DIAGNOSIS — R05 Cough: Secondary | ICD-10-CM | POA: Diagnosis not present

## 2018-09-09 NOTE — ED Notes (Signed)
Pt st's he is going to leave and follow up with his MD.  Encouraged pt to stay

## 2018-09-09 NOTE — ED Notes (Signed)
Pt's family member concerned that pt has had a productive cough and thinks the cough may have been what caused him to have a syncopal episode.  Chest x-ray ordered

## 2018-09-11 DIAGNOSIS — R55 Syncope and collapse: Secondary | ICD-10-CM | POA: Diagnosis not present

## 2018-09-11 DIAGNOSIS — Z23 Encounter for immunization: Secondary | ICD-10-CM | POA: Diagnosis not present

## 2018-09-11 DIAGNOSIS — R05 Cough: Secondary | ICD-10-CM | POA: Diagnosis not present

## 2018-10-02 DIAGNOSIS — I10 Essential (primary) hypertension: Secondary | ICD-10-CM | POA: Diagnosis not present

## 2018-10-02 DIAGNOSIS — Z8679 Personal history of other diseases of the circulatory system: Secondary | ICD-10-CM | POA: Diagnosis not present

## 2018-10-02 DIAGNOSIS — I351 Nonrheumatic aortic (valve) insufficiency: Secondary | ICD-10-CM | POA: Diagnosis not present

## 2018-10-04 ENCOUNTER — Institutional Professional Consult (permissible substitution): Payer: Medicare Other | Admitting: Internal Medicine

## 2018-10-09 ENCOUNTER — Institutional Professional Consult (permissible substitution): Payer: Medicare Other | Admitting: Internal Medicine

## 2018-10-11 ENCOUNTER — Other Ambulatory Visit (INDEPENDENT_AMBULATORY_CARE_PROVIDER_SITE_OTHER): Payer: Medicare Other

## 2018-10-11 ENCOUNTER — Ambulatory Visit (INDEPENDENT_AMBULATORY_CARE_PROVIDER_SITE_OTHER): Payer: Medicare Other | Admitting: Internal Medicine

## 2018-10-11 ENCOUNTER — Encounter: Payer: Self-pay | Admitting: Internal Medicine

## 2018-10-11 VITALS — BP 118/84 | HR 63 | Ht 68.0 in | Wt 203.8 lb

## 2018-10-11 DIAGNOSIS — R054 Cough syncope: Secondary | ICD-10-CM

## 2018-10-11 DIAGNOSIS — R059 Cough, unspecified: Secondary | ICD-10-CM

## 2018-10-11 DIAGNOSIS — R55 Syncope and collapse: Secondary | ICD-10-CM

## 2018-10-11 DIAGNOSIS — J45991 Cough variant asthma: Secondary | ICD-10-CM

## 2018-10-11 DIAGNOSIS — R05 Cough: Secondary | ICD-10-CM | POA: Diagnosis not present

## 2018-10-11 LAB — CBC WITH DIFFERENTIAL/PLATELET
Basophils Absolute: 0 10*3/uL (ref 0.0–0.1)
Basophils Relative: 0.7 % (ref 0.0–3.0)
EOS PCT: 1 % (ref 0.0–5.0)
Eosinophils Absolute: 0.1 10*3/uL (ref 0.0–0.7)
HEMATOCRIT: 45.4 % (ref 39.0–52.0)
HEMOGLOBIN: 15.8 g/dL (ref 13.0–17.0)
LYMPHS ABS: 1.4 10*3/uL (ref 0.7–4.0)
Lymphocytes Relative: 20.6 % (ref 12.0–46.0)
MCHC: 34.9 g/dL (ref 30.0–36.0)
MCV: 89.7 fl (ref 78.0–100.0)
MONO ABS: 0.4 10*3/uL (ref 0.1–1.0)
MONOS PCT: 6.7 % (ref 3.0–12.0)
NEUTROS ABS: 4.7 10*3/uL (ref 1.4–7.7)
Neutrophils Relative %: 71 % (ref 43.0–77.0)
Platelets: 152 10*3/uL (ref 150.0–400.0)
RBC: 5.07 Mil/uL (ref 4.22–5.81)
RDW: 14.1 % (ref 11.5–15.5)
WBC: 6.6 10*3/uL (ref 4.0–10.5)

## 2018-10-11 LAB — NITRIC OXIDE: NITRIC OXIDE: 15

## 2018-10-11 MED ORDER — BENZONATATE 200 MG PO CAPS: 200.0000 mg | ORAL_CAPSULE | Freq: Three times a day (TID) | ORAL | 1 refills | Status: DC | PRN

## 2018-10-11 NOTE — Progress Notes (Signed)
Eric Chambers, male    DOB: 1946-08-11,    MRN: [id number]    Brief patient profile:  39 yowm never smoker with pna while serving in [employer] in [place] and tendency to bad colds sev times a year initially ? Triggered by wife's teaching kids "catching everything she had"  And noted problems with cough on jogging "when the air pollution  was bad" then started traveling to [place] (the country) 2016 with really bad colds/ coughing spells on return to [place] to point of syncope early Oct 2019 so referred to pulmonary clinic 10/11/2018 by Dr   Melinda Crutch     History of Present Illness  10/11/2018 Pulmonary/ first office eval Eric Chambers Chief Complaint  Patient presents with  . Pulm Consult    Referred by Dr. Harrington Challenger for history of cough. Per patient, after he develops a cold, he will have a linger cough. He has several episodes of coughing and fainting. Semi-productive cough. Notices the cough more at night.   Dyspnea:  Stopped jogging due to knees, breathing fine with brisk walk includes hills presently  Cough: resolved now but still some sense of pnds day >> noct Sleep: on side/ on couple of pillows  SABA use: none    No obvious other patterns in day to day or daytime variability or assoc excess/ purulent sputum or mucus plugs or hemoptysis or cp or chest tightness, subjective wheeze or overt sinus or hb symptoms.   Sleeps as above  without nocturnal  or early am exacerbation  of respiratory  c/o's or need for noct saba. Also denies any obvious fluctuation of symptoms with weather or environmental changes or other aggravating or alleviating factors except as outlined above   No unusual exposure hx or h/o childhood pna/ asthma or knowledge of premature birth.  Current Allergies, Complete Past Medical History, Past Surgical History, Family History, and Social History were reviewed in [employer] record.  ROS  The following are not active complaints unless bolded Hoarseness, sore  throat, dysphagia, dental problems, itching, sneezing,  nasal congestion or discharge of excess mucus or purulent secretions, ear ache,   fever, chills, sweats, unintended wt loss or wt gain, classically pleuritic or exertional cp,  orthopnea pnd or arm/hand swelling  or leg swelling, presyncope, palpitations, abdominal pain, anorexia, nausea, vomiting, diarrhea  or change in bowel habits or change in bladder habits, change in stools or change in urine, dysuria, hematuria,  rash, arthralgias, visual complaints, headache, numbness, weakness or ataxia or problems with walking or coordination,  change in mood or  memory.             Past Medical History:  Diagnosis Date  . Aortic valve regurgitation   . Cancer (HCC)    squamous cell x2  . Endocarditis   . 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 Enlarged prostate   . Heart murmur    "think I have one now" (08/16/2013)  . High cholesterol   . Hypertension   . Pneumonia    "almost once/yr" (08/16/2013)  . Sleep apnea    "don't wear mask" (08/16/2013)       Outpatient Medications Prior to Visit  Medication Sig Dispense Refill  . atorvastatin (LIPITOR) 10 MG tablet Take 1 tablet (10 mg total) by mouth daily. 30 tablet 5  . benzonatate (TESSALON) 100 MG capsule TAKE 1 TO 2 CAPSULES BY MOUTH 3 TIMES A DAY  AS NEEDED FOR COUGH  3  . ibuprofen (ADVIL) 200 MG tablet Take 600 mg by mouth every 6 (six) hours as needed for moderate pain.    Marland Kitchen LUMIGAN 0.01 % SOLN Place 1 drop into both eyes daily.    Marland Kitchen telmisartan (MICARDIS) 40 MG tablet Take 40 mg by mouth daily.  2             Objective:     BP 118/84 (BP Location: Left Arm, Patient Position: Sitting, Cuff Size: Normal)   Pulse 63   Ht 5\' 8"  (1.727 m)   Wt 203 lb 12.8 oz (92.4 kg)   SpO2 96%   BMI 30.99 kg/m   SpO2: 96 %   RA  Robust amb wm nad   HEENT: nl dentition,   and oropharynx which is pristine. Nl external  ear canals without cough reflex - mild bilateral non-specific turbinate edema     NECK :  without JVD/Nodes/TM/ nl carotid upstrokes bilaterally   LUNGS: no acc muscle use,  Nl contour chest which is clear to A and P bilaterally without cough on insp or exp maneuvers   CV:  RRR  no s3 or murmur or increase in P2, and no edema   ABD:  soft and nontender with nl inspiratory excursion in the supine position. No bruits or organomegaly appreciated, bowel sounds nl  MS:  Nl gait/ ext warm without deformities, calf tenderness, cyanosis or clubbing No obvious joint restrictions   SKIN: warm and dry without lesions    NEURO:  alert, approp, nl sensorium with  no motor or cerebellar deficits apparent.      I personally reviewed images and agree with radiology impression as follows:  CXR:   09/09/18 No active cardiopulmonary disease.   Labs ordered 10/11/2018  Allergy profile      Assessment   No problem-specific Assessment & Plan notes found for this encounter.     Christinia Gully, MD 10/11/2018

## 2018-10-11 NOTE — Patient Instructions (Addendum)
At the very onset of any coughing > Try prilosec otc 20mg   Take 30-60 min before first meal of the day and Pepcid ac (famotidine) 20 mg one @  bedtime until cough is completely gone for at least a week without the need for cough suppression  GERD (REFLUX)  is an extremely common cause of respiratory symptoms just like yours , many times with no obvious heartburn at all.    It can be treated with medication, but also with lifestyle changes including elevation of the head of your bed (ideally with 6 inch  bed blocks),  Smoking cessation, avoidance of late meals, excessive alcohol, and avoid fatty foods, chocolate, peppermint, colas, red wine, and acidic juices such as orange juice.  NO MINT OR MENTHOL PRODUCTS SO NO COUGH DROPS  USE SUGARLESS CANDY INSTEAD (Jolley ranchers or Stover's or Life Savers) or even ice chips will also do - the key is to swallow to prevent all throat clearing. NO OIL BASED VITAMINS - use powdered substitutes.       To suppress cough > tessalon 200 mg every 8 hours as needed  For drainage / throat tickle try take CHLORPHENIRAMINE  4 mg - take one every 4 hours as needed - available over the counter- may cause drowsiness so start with just a bedtime dose or two and see how you tolerate it before trying in daytime     Please see patient coordinator before you leave today  to schedule sinus ct   Please remember to go to the lab department downstairs in the basement  for your tests - we will call you with the results when they are available.      If start coughing for any reason lie down immediately    Please schedule a follow up office visit in 6 weeks, call sooner if needed with all medications /inhalers/ solutions in hand so we can verify exactly what you are taking. This includes all medications from all doctors and over the counters

## 2018-10-12 ENCOUNTER — Encounter: Payer: Self-pay | Admitting: Internal Medicine

## 2018-10-12 DIAGNOSIS — R55 Syncope and collapse: Secondary | ICD-10-CM | POA: Insufficient documentation

## 2018-10-12 DIAGNOSIS — R05 Cough: Secondary | ICD-10-CM | POA: Insufficient documentation

## 2018-10-12 LAB — RESPIRATORY ALLERGY PROFILE REGION II ~~LOC~~
Allergen, A. alternata, m6: <0.1 kU/L
Allergen, Cedar tree, t12: <0.1 kU/L
Allergen, Mulberry, t76: <0.1 kU/L
Allergen, P. notatum, m1: <0.1 kU/L
Aspergillus fumigatus, m3: <0.1 kU/L
Bermuda Grass: <0.1 kU/L
Box Elder IgE: <0.1 kU/L
CLADOSPORIUM HERBARUM (M2) IGE: <0.1 kU/L
CLASS: 0
CLASS: 0
CLASS: 0
CLASS: 0
CLASS: 0
CLASS: 0
CLASS: 0
CLASS: 0
CLASS: 0
COMMON RAGWEED (SHORT) (W1) IGE: <0.1 kU/L
Class: 0
Class: 0
Class: 0
Class: 0
Class: 0
Class: 0
Class: 0
Class: 0
Class: 0
Class: 0
Class: 0
Class: 0
Class: 0
Class: 0
Class: 0
D. farinae: <0.1 kU/L
Dog Dander: <0.1 kU/L
IGE (IMMUNOGLOBULIN E), SERUM: 11 kU/L (ref <=114)
Rough Pigweed  IgE: <0.1 kU/L
Sheep Sorrel IgE: <0.1 kU/L

## 2018-10-12 LAB — INTERPRETATION:

## 2018-10-12 NOTE — Assessment & Plan Note (Addendum)
FENO 10/11/2018  =   15  Allergy profile 10/11/2018 >  Eos 0.1 /  IgE  Pending  - Sinus CT ordered    Pattern of severe cough going back years with most recent flare resulting in cough syncope ? Related in part to severe MR (see separate a/p)   The most common causes of chronic cough in immunocompetent adults include the following: upper airway cough syndrome (UACS), previously referred to as postnasal drip syndrome (PNDS), which is caused by variety of rhinosinus conditions; (2) asthma; (3) GERD; (4) chronic bronchitis from cigarette smoking or other inhaled environmental irritants; (5) nonasthmatic eosinophilic bronchitis; and (6) bronchiectasis.   These conditions, singly or in combination, have accounted for up to 94% of the causes of chronic cough in prospective studies.   Other conditions have constituted no >6% of the causes in prospective studies These have included bronchogenic carcinoma, chronic interstitial pneumonia, sarcoidosis, left ventricular failure, ACEI-induced cough, and aspiration from a condition associated with pharyngeal dysfunction.    Chronic cough is often simultaneously caused by more than one condition. A single cause has been found from 38 to 82% of the time, multiple causes from 18 to 62%. Multiply caused cough has been the result of three diseases up to 42% of the time.      Of the three most common causes of  Sub-acute / recurrent or chronic cough, only one (GERD)  can actually contribute to/ trigger  the other two (asthma and post nasal drip syndrome)  and perpetuate the cylce of cough.  While not intuitively obvious, many patients with chronic low grade reflux do not cough until there is a primary insult that disturbs the protective epithelial barrier and exposes sensitive nerve endings.   This is typically viral but can due to PNDS and  either may apply here.   The point is that once this occurs, it is difficult to eliminate the cycle  using anything but a  maximally effective acid suppression regimen at least in the short run, accompanied by an appropriate diet to address non acid GERD and control / eliminate the pnds with 1st gen H1 blockers per guidelines  And stop the cough itself for at least 3 days with high dose tessalon and supplement with codeine if needed   Cautioned to lie down immediately if starts coughing and not to drive at all if any urge to cough or clear throat, and return in 6 weeks for f/u with all meds in hand using a trust but verify approach to confirm accurate Medication  Reconciliation The principal here is that until we are certain that the  patients are doing what we've asked, it makes no sense to ask them to do more.

## 2018-10-12 NOTE — Progress Notes (Signed)
LMTCB

## 2018-10-12 NOTE — Assessment & Plan Note (Signed)
See echo 01/12/18  - Normal LVF, mild to moderate aortic annular calcification with   mild AVSC, moderate to severe MR with PHT 233msec eccentric   towards the AMVL, mildly dilated aortic root, G1DD. Compared to   echo 2017, the AR appears to have progressed   Given his valvular heart dz the worst situation would be a drop in venous return coupled with a very high thoracic pressure during coughing fits and may argue in this setting (recurrent severe cough) a lower threshold to consider valvular heart surgery at some point / advised.    Would avoid BB in this setting and observe above recs re avoiding driving when coughing at all.      Total time devoted to counseling  > 50 % of initial 60 min office visit:  review case with pt/ discussion of options/alternatives/ personally creating written customized instructions  in presence of pt  then going over those specific  Instructions directly with the pt including how to use all of the meds but in particular covering each new medication in detail and the difference between the maintenance= "automatic" meds and the prns using an action plan format for the latter (If this problem/symptom => do that organization reading Left to right).  Please see AVS from this visit for a full list of these instructions which I personally wrote for this pt and  are unique to this visit.

## 2018-10-13 NOTE — Progress Notes (Signed)
Spoke with pt and notified of results per Dr. Wert. Pt verbalized understanding and denied any questions. 

## 2018-10-16 ENCOUNTER — Telehealth: Payer: Self-pay | Admitting: Internal Medicine

## 2018-10-16 NOTE — Telephone Encounter (Signed)
LMTCB

## 2018-10-16 NOTE — Telephone Encounter (Signed)
Spoke with patient. He is aware of his results, as he had already been informed. Nothing further needed at time of call.

## 2018-10-16 NOTE — Telephone Encounter (Signed)
Patient returning phone call.  Phone number is [phone].

## 2018-10-19 ENCOUNTER — Ambulatory Visit (INDEPENDENT_AMBULATORY_CARE_PROVIDER_SITE_OTHER)
Admission: RE | Admit: 2018-10-19 | Discharge: 2018-10-19 | Disposition: A | Discharge status: Home / Self Care | Payer: Medicare Other | Source: Ambulatory Visit | Attending: Internal Medicine | Admitting: Internal Medicine

## 2018-10-19 DIAGNOSIS — J45991 Cough variant asthma: Secondary | ICD-10-CM

## 2018-10-19 DIAGNOSIS — R05 Cough: Secondary | ICD-10-CM | POA: Diagnosis not present

## 2018-10-19 DIAGNOSIS — R059 Cough, unspecified: Secondary | ICD-10-CM

## 2018-10-19 DIAGNOSIS — J3489 Other specified disorders of nose and nasal sinuses: Secondary | ICD-10-CM | POA: Diagnosis not present

## 2018-10-19 NOTE — Progress Notes (Signed)
Left detailed msg with results

## 2018-10-20 ENCOUNTER — Institutional Professional Consult (permissible substitution): Payer: Medicare Other | Admitting: Internal Medicine

## 2018-10-24 DIAGNOSIS — L570 Actinic keratosis: Secondary | ICD-10-CM | POA: Diagnosis not present

## 2018-10-27 DIAGNOSIS — E7800 Pure hypercholesterolemia, unspecified: Secondary | ICD-10-CM | POA: Diagnosis not present

## 2018-10-27 DIAGNOSIS — H9190 Unspecified hearing loss, unspecified ear: Secondary | ICD-10-CM | POA: Diagnosis not present

## 2018-10-27 DIAGNOSIS — I10 Essential (primary) hypertension: Secondary | ICD-10-CM | POA: Diagnosis not present

## 2018-10-30 DIAGNOSIS — E7800 Pure hypercholesterolemia, unspecified: Secondary | ICD-10-CM | POA: Diagnosis not present

## 2018-10-30 DIAGNOSIS — I10 Essential (primary) hypertension: Secondary | ICD-10-CM | POA: Diagnosis not present

## 2018-10-30 DIAGNOSIS — H9190 Unspecified hearing loss, unspecified ear: Secondary | ICD-10-CM | POA: Diagnosis not present

## 2018-11-06 DIAGNOSIS — E7800 Pure hypercholesterolemia, unspecified: Secondary | ICD-10-CM | POA: Diagnosis not present

## 2018-11-06 DIAGNOSIS — Z0000 Encounter for general adult medical examination without abnormal findings: Secondary | ICD-10-CM | POA: Diagnosis not present

## 2018-11-06 DIAGNOSIS — I10 Essential (primary) hypertension: Secondary | ICD-10-CM | POA: Diagnosis not present

## 2018-11-06 DIAGNOSIS — J309 Allergic rhinitis, unspecified: Secondary | ICD-10-CM | POA: Diagnosis not present

## 2018-11-14 ENCOUNTER — Ambulatory Visit (INDEPENDENT_AMBULATORY_CARE_PROVIDER_SITE_OTHER): Payer: Medicare Other | Admitting: Internal Medicine

## 2018-11-14 ENCOUNTER — Encounter: Payer: Self-pay | Admitting: Internal Medicine

## 2018-11-14 DIAGNOSIS — J45991 Cough variant asthma: Secondary | ICD-10-CM | POA: Diagnosis not present

## 2018-11-14 DIAGNOSIS — R05 Cough: Secondary | ICD-10-CM

## 2018-11-14 DIAGNOSIS — R55 Syncope and collapse: Secondary | ICD-10-CM

## 2018-11-14 NOTE — Patient Instructions (Addendum)
No change in recommendations   Chlorpheniramine (chlor tabs 4 mg) is a choice for night time cough    Follow up is as needed

## 2018-11-14 NOTE — Progress Notes (Signed)
Eric Chambers, male    DOB: 20-Sep-1946,    MRN: [id number]    Brief patient profile:  14 yowm never smoker with pna while serving in [employer] in [place] and tendency to bad colds sev times a year initially ? Triggered by wife's teaching kids "catching everything she had"  And noted problems with cough on jogging "when the air pollution  was bad" then started traveling to [place] (the country) 2016 with really bad colds/ coughing spells on return to [place] to point of syncope early Oct 2019 so referred to pulmonary clinic 10/11/2018 by Dr   Melinda Crutch     History of Present Illness  10/11/2018 Pulmonary/ first office eval [facility] Chief Complaint  Patient presents with  . Pulm Consult    Referred by Dr. Harrington Challenger for history of cough. Per patient, after he develops a cold, he will have a linger cough. He has several episodes of coughing and fainting. Semi-productive cough. Notices the cough more at night.   Dyspnea:  Stopped jogging due to knees, breathing fine with brisk walk includes hills presently  Cough: resolved now but still some sense of pnds day >> noct Sleep: on side/ on couple of pillows  SABA use: none  rec At the very onset of any coughing > Try prilosec otc 20mg   Take 30-60 min before first meal of the day and Pepcid ac (famotidine) 20 mg one @  bedtime until cough is completely gone for at least a week without the need for cough suppression GERD diet To suppress cough > tessalon 200 mg every 8 hours as needed For drainage / throat tickle try take CHLORPHENIRAMINE  4 mg - take one every 4 hours as needed - available over the counter Please see patient coordinator before you leave today  to schedule sinus ct Please remember to go to the lab department downstairs in the basement  for your tests - we will call you with the results when they are available. If start coughing for any reason lie down immediately  Please schedule a follow up office visit in 6 weeks, call sooner if needed with all  medications /inhalers/ solutions in hand so we can verify exactly what you are taking. This includes all medications from all doctors and over the counters     11/14/2018  f/u ov/Saphyra Hutt re: cough syncope/ has not recurred as no longer coughing much at all  / has not used gerd rx  Chief Complaint  Patient presents with  . Follow-up    Cough is much improved and his breathing has been doing well.   Dyspnea:  Not limited by breathing from desired activities  But by knees / brisk walk all weather conditions Cough: rarely need tessalon Sleeping: fine now  SABA use: none  02: none     No obvious day to day or daytime variability or assoc excess/ purulent sputum or mucus plugs or hemoptysis or cp or chest tightness, subjective wheeze or overt sinus or hb symptoms.   sleeping without nocturnal  or early am exacerbation  of respiratory  c/o's or need for noct saba. Also denies any obvious fluctuation of symptoms with weather or environmental changes or other aggravating or alleviating factors except as outlined above   No unusual exposure hx or h/o childhood pna/ asthma or knowledge of premature birth.  Current Allergies, Complete Past Medical History, Past Surgical History, Family History, and Social History were reviewed in [employer] record.  ROS  The following  are not active complaints unless bolded Hoarseness, sore throat, dysphagia, dental problems, itching, sneezing,  nasal congestion or discharge of excess mucus or purulent secretions, ear ache,   fever, chills, sweats, unintended wt loss or wt gain, classically pleuritic or exertional cp,  orthopnea pnd or arm/hand swelling  or leg swelling, presyncope, palpitations, abdominal pain, anorexia, nausea, vomiting, diarrhea  or change in bowel habits or change in bladder habits, change in stools or change in urine, dysuria, hematuria,  rash, arthralgias, visual complaints, headache, numbness, weakness or ataxia or problems  with walking or coordination,  change in mood or  memory.        Current Meds  Medication Sig  . atorvastatin (LIPITOR) 10 MG tablet Take 1 tablet (10 mg total) by mouth daily.  . benzonatate (TESSALON) 200 MG capsule Take 1 capsule (200 mg total) by mouth 3 (three) times daily as needed for cough.  Marland Kitchen ibuprofen (ADVIL) 200 MG tablet Take 600 mg by mouth every 6 (six) hours as needed for moderate pain.  Marland Kitchen LUMIGAN 0.01 % SOLN Place 1 drop into both eyes daily.  Marland Kitchen telmisartan (MICARDIS) 40 MG tablet Take 40 mg by mouth daily.             Objective:     amb wm nad  Wt Readings from Last 3 Encounters:  11/14/18 200 lb 12.8 oz (91.1 kg)  10/11/18 203 lb 12.8 oz (92.4 kg)  09/08/18 204 lb (92.5 kg)     Vital signs reviewed - Note on arrival 02 sats  97% on RA       HEENT: nl dentition, turbinates bilaterally, and oropharynx. Nl external ear canals without cough reflex   NECK :  without JVD/Nodes/TM/ nl carotid upstrokes bilaterally   LUNGS: no acc muscle use,  Nl contour chest which is clear to A and P bilaterally without cough on insp or exp maneuvers   CV:  RRR  no s3  II-III/VI HSM  But no  increase in P2, and no edema   ABD:  soft and nontender with nl inspiratory excursion in the supine position. No bruits or organomegaly appreciated, bowel sounds nl  MS:  Nl gait/ ext warm without deformities, calf tenderness, cyanosis or clubbing No obvious joint restrictions   SKIN: warm and dry without lesions    NEURO:  alert, approp, nl sensorium with  no motor or cerebellar deficits apparent.                     Assessment

## 2018-11-17 ENCOUNTER — Encounter: Payer: Self-pay | Admitting: Internal Medicine

## 2018-11-17 NOTE — Assessment & Plan Note (Signed)
FENO 10/11/2018  =   15  Allergy profile 10/11/2018 >  Eos 0.1 /  IgE  11 RAST neg  - Sinus CT 10/19/2018 >>> Mild bilateral maxillary alveolar recess mucosal thickening is stable since September and of unclear significance.  Strongly favor over asthma the dx of Upper airway cough syndrome (previously labeled PNDS),  is so named because it's frequently impossible to sort out how much is  CR/sinusitis with freq throat clearing (which can be related to primary GERD)   vs  causing  secondary (" extra esophageal")  GERD from wide swings in gastric pressure that occur with throat clearing, often  promoting self use of mint and menthol lozenges that reduce the lower esophageal sphincter tone and exacerbate the problem further in a cyclical fashion.   These are the same pts (now being labeled as having "irritable larynx syndrome" by some cough centers) who not infrequently have a history of having failed to tolerate ace inhibitors,  dry powder inhalers or biphosphonates or report having atypical/extraesophageal reflux symptoms that don't respond to standard doses of PPI  and are easily confused as having aecopd or asthma flares by even experienced allergists/ pulmonologists (myself included).   In event cough recurs on Day 1  rec max suppression with tessalon, otc suppression of acid/ gerd diet adherence, and 1st gen H1 blockers per guidelines

## 2018-11-17 NOTE — Assessment & Plan Note (Signed)
See echo 01/12/18  - Normal LVF, mild to moderate aortic annular calcification with   mild AVSC, moderate to severe MR with PHT 273msec eccentric   towards the AMVL, mildly dilated aortic root, G1DD. Compared to   echo 2017, the AR appears to have progressed   Again advised on "perfect storm" of MR with severe cough potentially contributing to presyncope/ syncope and should  Therefore treat the cough aggressively and liet down/ if driving pull off the road immediately if start coughing for any reason and obviously avoid ACEi in this setting   I had an extended discussion with the patient reviewing all relevant studies completed to date and  lasting 15 to 20 minutes of a 25 minute visit    Each maintenance medication was reviewed in detail including most importantly the difference between maintenance and prns and under what circumstances the prns are to be triggered using an action plan format that is not reflected in the computer generated alphabetically organized AVS.     Please see AVS for specific instructions unique to this visit that I personally wrote and verbalized to the the pt in detail and then reviewed with pt  by my nurse highlighting any  changes in therapy recommended at today's visit to their plan of care.

## 2018-12-08 DIAGNOSIS — R05 Cough: Secondary | ICD-10-CM | POA: Diagnosis not present

## 2018-12-21 DIAGNOSIS — R05 Cough: Secondary | ICD-10-CM | POA: Diagnosis not present

## 2019-04-09 DIAGNOSIS — Z8679 Personal history of other diseases of the circulatory system: Secondary | ICD-10-CM | POA: Diagnosis not present

## 2019-04-09 DIAGNOSIS — R972 Elevated prostate specific antigen [PSA]: Secondary | ICD-10-CM | POA: Diagnosis not present

## 2019-04-09 DIAGNOSIS — I351 Nonrheumatic aortic (valve) insufficiency: Secondary | ICD-10-CM | POA: Diagnosis not present

## 2019-04-09 DIAGNOSIS — I10 Essential (primary) hypertension: Secondary | ICD-10-CM | POA: Diagnosis not present

## 2019-04-10 DIAGNOSIS — Z85828 Personal history of other malignant neoplasm of skin: Secondary | ICD-10-CM | POA: Diagnosis not present

## 2019-04-10 DIAGNOSIS — L578 Other skin changes due to chronic exposure to nonionizing radiation: Secondary | ICD-10-CM | POA: Diagnosis not present

## 2019-04-10 DIAGNOSIS — D1801 Hemangioma of skin and subcutaneous tissue: Secondary | ICD-10-CM | POA: Diagnosis not present

## 2019-04-10 DIAGNOSIS — L821 Other seborrheic keratosis: Secondary | ICD-10-CM | POA: Diagnosis not present

## 2019-04-10 DIAGNOSIS — D225 Melanocytic nevi of trunk: Secondary | ICD-10-CM | POA: Diagnosis not present

## 2019-04-12 DIAGNOSIS — L570 Actinic keratosis: Secondary | ICD-10-CM | POA: Diagnosis not present

## 2019-05-23 DIAGNOSIS — H2513 Age-related nuclear cataract, bilateral: Secondary | ICD-10-CM | POA: Diagnosis not present

## 2019-05-23 DIAGNOSIS — H40013 Open angle with borderline findings, low risk, bilateral: Secondary | ICD-10-CM | POA: Diagnosis not present

## 2019-06-07 DIAGNOSIS — Z20828 Contact with and (suspected) exposure to other viral communicable diseases: Secondary | ICD-10-CM | POA: Diagnosis not present

## 2019-06-11 DIAGNOSIS — I10 Essential (primary) hypertension: Secondary | ICD-10-CM | POA: Diagnosis not present

## 2019-06-11 DIAGNOSIS — Z8679 Personal history of other diseases of the circulatory system: Secondary | ICD-10-CM | POA: Diagnosis not present

## 2019-06-11 DIAGNOSIS — I110 Hypertensive heart disease with heart failure: Secondary | ICD-10-CM | POA: Diagnosis not present

## 2019-06-11 DIAGNOSIS — I351 Nonrheumatic aortic (valve) insufficiency: Secondary | ICD-10-CM | POA: Diagnosis not present

## 2019-06-14 DIAGNOSIS — Z0184 Encounter for antibody response examination: Secondary | ICD-10-CM | POA: Diagnosis not present

## 2019-07-11 DIAGNOSIS — Z1159 Encounter for screening for other viral diseases: Secondary | ICD-10-CM | POA: Diagnosis not present

## 2019-09-10 DIAGNOSIS — Z20828 Contact with and (suspected) exposure to other viral communicable diseases: Secondary | ICD-10-CM | POA: Diagnosis not present

## 2019-09-19 DIAGNOSIS — H401131 Primary open-angle glaucoma, bilateral, mild stage: Secondary | ICD-10-CM | POA: Diagnosis not present

## 2019-09-21 DIAGNOSIS — L814 Other melanin hyperpigmentation: Secondary | ICD-10-CM | POA: Diagnosis not present

## 2019-09-21 DIAGNOSIS — D1801 Hemangioma of skin and subcutaneous tissue: Secondary | ICD-10-CM | POA: Diagnosis not present

## 2019-09-21 DIAGNOSIS — L578 Other skin changes due to chronic exposure to nonionizing radiation: Secondary | ICD-10-CM | POA: Diagnosis not present

## 2019-09-21 DIAGNOSIS — D225 Melanocytic nevi of trunk: Secondary | ICD-10-CM | POA: Diagnosis not present

## 2019-09-21 DIAGNOSIS — L570 Actinic keratosis: Secondary | ICD-10-CM | POA: Diagnosis not present

## 2019-09-21 DIAGNOSIS — L821 Other seborrheic keratosis: Secondary | ICD-10-CM | POA: Diagnosis not present

## 2019-09-21 DIAGNOSIS — Z85828 Personal history of other malignant neoplasm of skin: Secondary | ICD-10-CM | POA: Diagnosis not present

## 2019-09-27 DIAGNOSIS — R05 Cough: Secondary | ICD-10-CM | POA: Diagnosis not present

## 2019-09-27 DIAGNOSIS — R0981 Nasal congestion: Secondary | ICD-10-CM | POA: Diagnosis not present

## 2019-09-28 DIAGNOSIS — Z23 Encounter for immunization: Secondary | ICD-10-CM | POA: Diagnosis not present

## 2019-11-19 DIAGNOSIS — I10 Essential (primary) hypertension: Secondary | ICD-10-CM | POA: Diagnosis not present

## 2019-11-19 DIAGNOSIS — H9190 Unspecified hearing loss, unspecified ear: Secondary | ICD-10-CM | POA: Diagnosis not present

## 2019-11-19 DIAGNOSIS — E7800 Pure hypercholesterolemia, unspecified: Secondary | ICD-10-CM | POA: Diagnosis not present

## 2019-11-19 DIAGNOSIS — Z0000 Encounter for general adult medical examination without abnormal findings: Secondary | ICD-10-CM | POA: Diagnosis not present

## 2020-01-02 DIAGNOSIS — M545 Low back pain: Secondary | ICD-10-CM | POA: Diagnosis not present

## 2020-01-04 DIAGNOSIS — L578 Other skin changes due to chronic exposure to nonionizing radiation: Secondary | ICD-10-CM | POA: Diagnosis not present

## 2020-01-04 DIAGNOSIS — L821 Other seborrheic keratosis: Secondary | ICD-10-CM | POA: Diagnosis not present

## 2020-01-04 DIAGNOSIS — D1801 Hemangioma of skin and subcutaneous tissue: Secondary | ICD-10-CM | POA: Diagnosis not present

## 2020-01-04 DIAGNOSIS — D044 Carcinoma in situ of skin of scalp and neck: Secondary | ICD-10-CM | POA: Diagnosis not present

## 2020-01-04 DIAGNOSIS — L570 Actinic keratosis: Secondary | ICD-10-CM | POA: Diagnosis not present

## 2020-01-04 DIAGNOSIS — L814 Other melanin hyperpigmentation: Secondary | ICD-10-CM | POA: Diagnosis not present

## 2020-01-24 ENCOUNTER — Ambulatory Visit (INDEPENDENT_AMBULATORY_CARE_PROVIDER_SITE_OTHER): Payer: Medicare Other | Admitting: Cardiology

## 2020-01-24 ENCOUNTER — Other Ambulatory Visit: Payer: Self-pay

## 2020-01-24 ENCOUNTER — Encounter: Payer: Self-pay | Admitting: Cardiology

## 2020-01-24 VITALS — BP 106/50 | HR 70 | Ht 68.0 in | Wt 199.6 lb

## 2020-01-24 DIAGNOSIS — I351 Nonrheumatic aortic (valve) insufficiency: Secondary | ICD-10-CM

## 2020-01-24 DIAGNOSIS — I10 Essential (primary) hypertension: Secondary | ICD-10-CM | POA: Diagnosis not present

## 2020-01-24 DIAGNOSIS — E782 Mixed hyperlipidemia: Secondary | ICD-10-CM | POA: Diagnosis not present

## 2020-01-24 NOTE — Progress Notes (Signed)
Cardiology Office Note:    Date:  01/24/2020   ID:  Eric Chambers, DOB [birth year], MRN [id number]  PCP:  Eric Cruel, MD  Cardiologist:  Candee Furbish, MD  Electrophysiologist:  None   Referring MD: Eric Cruel, MD     History of Present Illness:    Eric Chambers is a 74 y.o. male with moderate aortic valve regurgitation, discussion with Dr. Cyndia Chambers in 2019 where he was going to think about the timing of surgery and call back.  He ended up having a second opinion at [facility] with cardiology and repeat echocardiogram showed more moderate aortic regurgitation with lack of symptoms.  Continued surveillance was recommended.  An echocardi[facility] which showed moderate aortic regurgitation normal pump function.  He had consultation with Eric Chambers on spring of, 2019.  Had prior history of endocarditis in [facility] then again in 2014 treated with IV antibiotics.  At the time doing Eric [facility] consultation he was asymptomatic.  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  Recommend continued surveillance imaging but findings did not meet indicators for valve intervention.  Continues to be stable.  Received first Covid vaccine.  He was in an egronomist. [employer].   Past Medical History:  Diagnosis Date  . Aortic valve regurgitation   . Cancer (HCC)    squamous cell x2  . Endocarditis   . 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 Enlarged prostate   . Heart murmur    "think I have one now" (08/16/2013)  . High cholesterol   . Hypertension   . Pneumonia    "almost once/yr" (08/16/2013)  . Sleep apnea    "don't wear mask" (08/16/2013)    Past Surgical History:  Procedure Laterality Date  . INGUINAL HERNIA REPAIR Right ~ 1950  . TEE WITHOUT CARDIOVERSION N/A 08/17/2013   Procedure: TRANSESOPHAGEAL ECHOCARDIOGRAM (TEE);  Surgeon: Candee Furbish, MD;  Location: [facility];  Service: Cardiovascular;  Laterality: N/A;  . TONSILLECTOMY  ~ 1957  . UMBILICAL HERNIA REPAIR  ~ 2011    Current Medications: Current Meds  Medication Sig  . ibuprofen (ADVIL) 200 MG tablet Take 600 mg by mouth every 6 (six) hours as needed for moderate pain.  . rosuvastatin (CRESTOR) 10 MG tablet Take 10 mg by mouth daily.  Marland Kitchen telmisartan-hydrochlorothiazide (MICARDIS HCT) 40-12.5 MG tablet Take 1 tablet by mouth daily.     Allergies:   Patient has no known allergies.   Social History   Socioeconomic History  . Marital status: Married    Spouse name: Not on file  . Number of children: Not on file  . Years of education: Not on file  . Highest education level: Not on file  Occupational History  . Not on file  Tobacco Use  . Smoking status: Never Smoker  . Smokeless tobacco: Never Used  Substance and Sexual Activity  . Alcohol use: Yes    Alcohol/week: 2.0 standard drinks    Types: 2 Glasses of wine per week  . Drug use: No  . Sexual activity: Yes  Other Topics Concern  . Not on file  Social History Narrative  . Not on file   Social Determinants of Health   Financial Resource Strain:   . Difficulty of Paying Living Expenses: Not on file  Food Insecurity:   . Worried About Running  Out of Food in the Last Year: Not on file  . Ran Out of Food in the Last Year: Not on file  Transportation Needs:   . Lack of Transportation (Medical): Not on file  . Lack of Transportation (Non-Medical): Not on file  Physical Activity:   . Days of Exercise per Week: Not on file  . Minutes of Exercise per Session: Not on file  Stress:   . Feeling of Stress : Not on file  Social Connections:   . Frequency of Communication with Friends and Family: Not on file  . Frequency of Social Gatherings with Friends and Family: Not on file  . Attends Religious Services: Not on file  . Active Member of Clubs or Organizations: Not on file  . Attends Theatre manager Meetings: Not on file  . Marital Status: Not on file     Family History: The patient's family history includes Heart attack (age of onset: 28) in his father; Hypertension in his mother.  ROS:   Please see the history of present illness.     All other systems reviewed and are negative.  EKGs/Labs/Other Studies Reviewed:    The following studies were reviewed today: Prior echocardiogram care everywhere [place] cardiology office visit reviewed  EKG:  EKG is ordered today.  The ekg ordered today demonstrates sinus rhythm with left anterior fascicular block  Recent Labs: No results found for requested labs within last 8760 hours.  Recent Lipid Panel    Component Value Date/Time   CHOL 157 01/15/2014 0803   TRIG 126.0 01/15/2014 0803   HDL 41.60 01/15/2014 0803   CHOLHDL 4 01/15/2014 0803   VLDL 25.2 01/15/2014 0803   LDLCALC 90 01/15/2014 0803   LDLDIRECT 161.0 10/09/2013 1050    Physical Exam:    VS:  BP (!) 106/50   Pulse 70   Ht 5\' 8"  (1.727 m)   Wt 199 lb 9.6 oz (90.5 kg)   BMI 30.35 kg/m     Wt Readings from Last 3 Encounters:  01/24/20 199 lb 9.6 oz (90.5 kg)  11/14/18 200 lb 12.8 oz (91.1 kg)  10/11/18 203 lb 12.8 oz (92.4 kg)     GEN:  Well nourished, well developed in no acute distress HEENT: Normal NECK: No JVD; No carotid bruits LYMPHATICS: No lymphadenopathy CARDIAC: RRR, soft diastolic murmur, rubs, gallops RESPIRATORY:  Clear to auscultation without rales, wheezing or rhonchi  ABDOMEN: Soft, non-tender, non-distended MUSCULOSKELETAL:  No edema; No deformity  SKIN: Warm and dry NEUROLOGIC:  Alert and oriented x 3 PSYCHIATRIC:  Normal affect   ASSESSMENT:    1. Aortic valve insufficiency, etiology of cardiac valve disease unspecified   2. Essential hypertension   3. Mixed hyperlipidemia    PLAN:    In order of problems listed above:  Aortic regurgitation -Moderate at last echocardiogram.  Normal cardiac dimensions.  Eric Chambers  echocardiogram report reviewed.  We will go ahead and repeat echocardiogram in June 2021, this will be 1 year.  We will continue with annual surveillance.  At this time, he is asymptomatic.  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  Hyperlipidemia -Currently on rosuvastatin 10 mg once a day.  May have had some muscle aches with atorvastatin.  Last LDL in November 2020 was 85.  ALT 23 creatinine 1.1, hemoglobin 15.8  Essential hypertension -Well-controlled on telmisartan HCTZ  We will check echocardiogram in June and then see him back here in 8 months and then after that  try to schedule annual visits with echocardiogram.  He knows to contact us if he has any worsening symptoms of shortness of breath.  Medication Adjustments/Labs and Tests Ordered: Current medicines are reviewed at length with the patient today.  Concerns regarding medicines are outlined above.  Orders Placed This Encounter  Procedures  . EKG 12-Lead  . ECHOCARDIOGRAM COMPLETE   No orders of the defined types were placed in this encounter.   Patient Instructions  Medication Instructions:  The current medical regimen is effective;  continue present plan and medications.  *If you need a refill on your cardiac medications before your next appointment, please call your pharmacy*  Testing/Procedures: Your physician has requested that you have an echocardiogram (due 05/2020).  Echocardiography is a painless test that uses sound waves to create images of your heart. It provides your doctor with information about the size and shape of your heart and how well your heart's chambers and valves are working. This procedure takes approximately one hour. There are no restrictions for this procedure.  Follow-Up: At [facility], you and your health needs are our priority.  As part of our continuing mission to provide you with exceptional heart care, we have created designated Provider Care Teams.  These Care  Teams include your primary Cardiologist (physician) and Advanced Practice Providers (APPs -  Physician Assistants and Nurse Practitioners) who all work together to provide you with the care you need, when you need it.  Your next appointment:   8 month(s)  The format for your next appointment:   In Person  Provider:   Candee Furbish, MD  Thank you for choosing [facility]!!         Signed, Candee Furbish, MD  01/24/2020 10:54 AM    [place]

## 2020-01-24 NOTE — Patient Instructions (Addendum)
Medication Instructions:  The current medical regimen is effective;  continue present plan and medications.  *If you need a refill on your cardiac medications before your next appointment, please call your pharmacy*  Testing/Procedures: Your physician has requested that you have an echocardiogram (due 05/2020).  Echocardiography is a painless test that uses sound waves to create images of your heart. It provides your doctor with information about the size and shape of your heart and how well your heart's chambers and valves are working. This procedure takes approximately one hour. There are no restrictions for this procedure.  Follow-Up: At [facility], you and your health needs are our priority.  As part of our continuing mission to provide you with exceptional heart care, we have created designated Provider Care Teams.  These Care Teams include your primary Cardiologist (physician) and Advanced Practice Providers (APPs -  Physician Assistants and Nurse Practitioners) who all work together to provide you with the care you need, when you need it.  Your next appointment:   8 month(s)  The format for your next appointment:   In Person  Provider:   Candee Furbish, MD  Thank you for choosing [facility]!!

## 2020-02-04 DIAGNOSIS — R3912 Poor urinary stream: Secondary | ICD-10-CM | POA: Diagnosis not present

## 2020-02-04 DIAGNOSIS — Z8679 Personal history of other diseases of the circulatory system: Secondary | ICD-10-CM | POA: Diagnosis not present

## 2020-02-04 DIAGNOSIS — R972 Elevated prostate specific antigen [PSA]: Secondary | ICD-10-CM | POA: Diagnosis not present

## 2020-02-04 DIAGNOSIS — I351 Nonrheumatic aortic (valve) insufficiency: Secondary | ICD-10-CM | POA: Diagnosis not present

## 2020-02-04 DIAGNOSIS — N401 Enlarged prostate with lower urinary tract symptoms: Secondary | ICD-10-CM | POA: Diagnosis not present

## 2020-02-04 DIAGNOSIS — I10 Essential (primary) hypertension: Secondary | ICD-10-CM | POA: Diagnosis not present

## 2020-02-08 ENCOUNTER — Other Ambulatory Visit (HOSPITAL_COMMUNITY): Payer: Medicare Other

## 2020-03-07 DIAGNOSIS — D044 Carcinoma in situ of skin of scalp and neck: Secondary | ICD-10-CM | POA: Diagnosis not present

## 2020-03-07 DIAGNOSIS — L578 Other skin changes due to chronic exposure to nonionizing radiation: Secondary | ICD-10-CM | POA: Diagnosis not present

## 2020-03-07 DIAGNOSIS — L905 Scar conditions and fibrosis of skin: Secondary | ICD-10-CM | POA: Diagnosis not present

## 2020-03-07 DIAGNOSIS — C44329 Squamous cell carcinoma of skin of other parts of face: Secondary | ICD-10-CM | POA: Diagnosis not present

## 2020-03-07 DIAGNOSIS — L570 Actinic keratosis: Secondary | ICD-10-CM | POA: Diagnosis not present

## 2020-03-07 DIAGNOSIS — L249 Irritant contact dermatitis, unspecified cause: Secondary | ICD-10-CM | POA: Diagnosis not present

## 2020-03-07 DIAGNOSIS — D485 Neoplasm of uncertain behavior of skin: Secondary | ICD-10-CM | POA: Diagnosis not present

## 2020-03-07 DIAGNOSIS — Z85828 Personal history of other malignant neoplasm of skin: Secondary | ICD-10-CM | POA: Diagnosis not present

## 2020-03-17 DIAGNOSIS — H40009 Preglaucoma, unspecified, unspecified eye: Secondary | ICD-10-CM | POA: Diagnosis not present

## 2020-03-25 DIAGNOSIS — D0439 Carcinoma in situ of skin of other parts of face: Secondary | ICD-10-CM | POA: Diagnosis not present

## 2020-03-31 DIAGNOSIS — R361 Hematospermia: Secondary | ICD-10-CM | POA: Diagnosis not present

## 2020-03-31 DIAGNOSIS — R972 Elevated prostate specific antigen [PSA]: Secondary | ICD-10-CM | POA: Diagnosis not present

## 2020-03-31 DIAGNOSIS — R3912 Poor urinary stream: Secondary | ICD-10-CM | POA: Diagnosis not present

## 2020-04-16 DIAGNOSIS — H524 Presbyopia: Secondary | ICD-10-CM | POA: Diagnosis not present

## 2020-04-16 DIAGNOSIS — H5213 Myopia, bilateral: Secondary | ICD-10-CM | POA: Diagnosis not present

## 2020-04-16 DIAGNOSIS — H52223 Regular astigmatism, bilateral: Secondary | ICD-10-CM | POA: Diagnosis not present

## 2020-04-16 DIAGNOSIS — H401131 Primary open-angle glaucoma, bilateral, mild stage: Secondary | ICD-10-CM | POA: Diagnosis not present

## 2020-04-25 DIAGNOSIS — D485 Neoplasm of uncertain behavior of skin: Secondary | ICD-10-CM | POA: Diagnosis not present

## 2020-04-25 DIAGNOSIS — L905 Scar conditions and fibrosis of skin: Secondary | ICD-10-CM | POA: Diagnosis not present

## 2020-04-25 DIAGNOSIS — L821 Other seborrheic keratosis: Secondary | ICD-10-CM | POA: Diagnosis not present

## 2020-04-25 DIAGNOSIS — L570 Actinic keratosis: Secondary | ICD-10-CM | POA: Diagnosis not present

## 2020-04-25 DIAGNOSIS — L814 Other melanin hyperpigmentation: Secondary | ICD-10-CM | POA: Diagnosis not present

## 2020-04-25 DIAGNOSIS — Z85828 Personal history of other malignant neoplasm of skin: Secondary | ICD-10-CM | POA: Diagnosis not present

## 2020-05-05 DIAGNOSIS — D040 Carcinoma in situ of skin of lip: Secondary | ICD-10-CM | POA: Diagnosis not present

## 2020-05-20 DIAGNOSIS — L249 Irritant contact dermatitis, unspecified cause: Secondary | ICD-10-CM | POA: Diagnosis not present

## 2020-05-20 DIAGNOSIS — D040 Carcinoma in situ of skin of lip: Secondary | ICD-10-CM | POA: Diagnosis not present

## 2020-06-06 ENCOUNTER — Other Ambulatory Visit (HOSPITAL_COMMUNITY): Payer: Medicare Other

## 2020-06-09 ENCOUNTER — Telehealth: Payer: Self-pay | Admitting: Cardiology

## 2020-06-09 NOTE — Telephone Encounter (Signed)
New message   Pt c/o Shortness Of Breath: STAT if SOB developed within the last 24 hours or pt is noticeably SOB on the phone  1. Are you currently SOB (can you hear that pt is SOB on the phone)?yes   2. How long have you been experiencing SOB? Per patient has sob for about 4 days  3. Are you SOB when sitting or when up moving around?both   4. Are you currently experiencing any other symptoms? Fluid in lungs,lightheadness, blurred vision

## 2020-06-09 NOTE — Telephone Encounter (Signed)
Pt reporting experiencing SOB, reports it as "not extreme, but I can feel it when I lay down". States he just got back from [place] and it has "been creeping up on me since mowing a big yard" while out in [place], he uses a Chiropractor. He "really notices it when he lays down at night, and he is "breathing hard like I just ran a big race". Denies CP, dizziness, syncope or swelling. BP avg 130s/80-90s Scheduled for echocardiogram on 7/2 Pt aware Dr. Marlou Porch is out of the office/hospital for the next 2 weeks.  Pt has also called [facility] to discuss reported concern/s. He is going to further discuss with their practice and get recommendation/s.  He will call back if this is not addressed by [facility]. Patient verbalized understanding and agreeable to plan.

## 2020-06-20 ENCOUNTER — Ambulatory Visit (HOSPITAL_COMMUNITY): Payer: Medicare Other | Attending: Cardiology

## 2020-06-20 ENCOUNTER — Other Ambulatory Visit: Payer: Self-pay

## 2020-06-20 DIAGNOSIS — I10 Essential (primary) hypertension: Secondary | ICD-10-CM

## 2020-06-20 DIAGNOSIS — I351 Nonrheumatic aortic (valve) insufficiency: Secondary | ICD-10-CM

## 2020-07-16 DIAGNOSIS — L570 Actinic keratosis: Secondary | ICD-10-CM | POA: Diagnosis not present

## 2020-07-16 DIAGNOSIS — L905 Scar conditions and fibrosis of skin: Secondary | ICD-10-CM | POA: Diagnosis not present

## 2020-07-16 DIAGNOSIS — Z85828 Personal history of other malignant neoplasm of skin: Secondary | ICD-10-CM | POA: Diagnosis not present

## 2020-07-16 DIAGNOSIS — L2084 Intrinsic (allergic) eczema: Secondary | ICD-10-CM | POA: Diagnosis not present

## 2020-07-23 DIAGNOSIS — H25811 Combined forms of age-related cataract, right eye: Secondary | ICD-10-CM | POA: Diagnosis not present

## 2020-07-23 DIAGNOSIS — H40012 Open angle with borderline findings, low risk, left eye: Secondary | ICD-10-CM | POA: Diagnosis not present

## 2020-07-23 DIAGNOSIS — H40011 Open angle with borderline findings, low risk, right eye: Secondary | ICD-10-CM | POA: Diagnosis not present

## 2020-07-23 DIAGNOSIS — H25812 Combined forms of age-related cataract, left eye: Secondary | ICD-10-CM | POA: Diagnosis not present

## 2020-09-19 DIAGNOSIS — Z23 Encounter for immunization: Secondary | ICD-10-CM | POA: Diagnosis not present

## 2020-09-24 DIAGNOSIS — L244 Irritant contact dermatitis due to drugs in contact with skin: Secondary | ICD-10-CM | POA: Diagnosis not present

## 2020-10-20 DIAGNOSIS — D040 Carcinoma in situ of skin of lip: Secondary | ICD-10-CM | POA: Diagnosis not present

## 2020-11-20 DIAGNOSIS — Z0000 Encounter for general adult medical examination without abnormal findings: Secondary | ICD-10-CM | POA: Diagnosis not present

## 2020-11-20 DIAGNOSIS — I10 Essential (primary) hypertension: Secondary | ICD-10-CM | POA: Diagnosis not present

## 2020-11-20 DIAGNOSIS — E7800 Pure hypercholesterolemia, unspecified: Secondary | ICD-10-CM | POA: Diagnosis not present

## 2020-11-20 DIAGNOSIS — N400 Enlarged prostate without lower urinary tract symptoms: Secondary | ICD-10-CM | POA: Diagnosis not present

## 2020-11-20 DIAGNOSIS — G4733 Obstructive sleep apnea (adult) (pediatric): Secondary | ICD-10-CM | POA: Diagnosis not present

## 2020-11-20 DIAGNOSIS — Z125 Encounter for screening for malignant neoplasm of prostate: Secondary | ICD-10-CM | POA: Diagnosis not present

## 2020-11-25 DIAGNOSIS — D229 Melanocytic nevi, unspecified: Secondary | ICD-10-CM | POA: Diagnosis not present

## 2020-11-25 DIAGNOSIS — L821 Other seborrheic keratosis: Secondary | ICD-10-CM | POA: Diagnosis not present

## 2020-11-25 DIAGNOSIS — L570 Actinic keratosis: Secondary | ICD-10-CM | POA: Diagnosis not present

## 2020-11-25 DIAGNOSIS — Z85828 Personal history of other malignant neoplasm of skin: Secondary | ICD-10-CM | POA: Diagnosis not present

## 2020-11-25 DIAGNOSIS — L814 Other melanin hyperpigmentation: Secondary | ICD-10-CM | POA: Diagnosis not present

## 2020-11-25 DIAGNOSIS — D1801 Hemangioma of skin and subcutaneous tissue: Secondary | ICD-10-CM | POA: Diagnosis not present

## 2020-11-25 DIAGNOSIS — B356 Tinea cruris: Secondary | ICD-10-CM | POA: Diagnosis not present

## 2020-11-25 DIAGNOSIS — L905 Scar conditions and fibrosis of skin: Secondary | ICD-10-CM | POA: Diagnosis not present

## 2020-11-25 DIAGNOSIS — L2084 Intrinsic (allergic) eczema: Secondary | ICD-10-CM | POA: Diagnosis not present

## 2020-11-25 DIAGNOSIS — D225 Melanocytic nevi of trunk: Secondary | ICD-10-CM | POA: Diagnosis not present

## 2020-11-27 DIAGNOSIS — G4733 Obstructive sleep apnea (adult) (pediatric): Secondary | ICD-10-CM | POA: Diagnosis not present

## 2020-11-28 DIAGNOSIS — G4733 Obstructive sleep apnea (adult) (pediatric): Secondary | ICD-10-CM | POA: Diagnosis not present

## 2020-12-11 DIAGNOSIS — R0981 Nasal congestion: Secondary | ICD-10-CM | POA: Diagnosis not present

## 2020-12-11 DIAGNOSIS — J111 Influenza due to unidentified influenza virus with other respiratory manifestations: Secondary | ICD-10-CM | POA: Diagnosis not present

## 2021-11-13 ENCOUNTER — Emergency Department (HOSPITAL_BASED_OUTPATIENT_CLINIC_OR_DEPARTMENT_OTHER)
Admission: EM | Admit: 2021-11-13 | Discharge: 2021-11-13 | Disposition: A | Discharge status: Home / Self Care | Payer: Medicare Other | Attending: Emergency Medicine | Admitting: Emergency Medicine

## 2021-11-13 ENCOUNTER — Encounter (HOSPITAL_BASED_OUTPATIENT_CLINIC_OR_DEPARTMENT_OTHER): Payer: Self-pay | Admitting: *Deleted

## 2021-11-13 ENCOUNTER — Emergency Department (HOSPITAL_BASED_OUTPATIENT_CLINIC_OR_DEPARTMENT_OTHER): Payer: Medicare Other | Admitting: Radiology

## 2021-11-13 ENCOUNTER — Other Ambulatory Visit: Payer: Self-pay

## 2021-11-13 DIAGNOSIS — I10 Essential (primary) hypertension: Secondary | ICD-10-CM | POA: Insufficient documentation

## 2021-11-13 DIAGNOSIS — Z85828 Personal history of other malignant neoplasm of skin: Secondary | ICD-10-CM | POA: Insufficient documentation

## 2021-11-13 DIAGNOSIS — Z79899 Other long term (current) drug therapy: Secondary | ICD-10-CM | POA: Diagnosis not present

## 2021-11-13 DIAGNOSIS — J069 Acute upper respiratory infection, unspecified: Secondary | ICD-10-CM | POA: Insufficient documentation

## 2021-11-13 DIAGNOSIS — R059 Cough, unspecified: Secondary | ICD-10-CM | POA: Diagnosis present

## 2021-11-13 DIAGNOSIS — Z20822 Contact with and (suspected) exposure to covid-19: Secondary | ICD-10-CM | POA: Insufficient documentation

## 2021-11-13 LAB — CBC WITH DIFFERENTIAL/PLATELET
Abs Immature Granulocytes: 0.01 10*3/uL (ref 0.00–0.07)
Basophils Absolute: 0 10*3/uL (ref 0.0–0.1)
Basophils Relative: 1 %
Eosinophils Absolute: 0.1 10*3/uL (ref 0.0–0.5)
Eosinophils Relative: 2 %
HCT: 41.2 % (ref 39.0–52.0)
Hemoglobin: 14 g/dL (ref 13.0–17.0)
Immature Granulocytes: 0 %
Lymphocytes Relative: 23 %
Lymphs Abs: 1.5 10*3/uL (ref 0.7–4.0)
MCH: 30.7 pg (ref 26.0–34.0)
MCHC: 34 g/dL (ref 30.0–36.0)
MCV: 90.4 fL (ref 80.0–100.0)
Monocytes Absolute: 0.3 10*3/uL (ref 0.1–1.0)
Monocytes Relative: 5 %
Neutro Abs: 4.5 10*3/uL (ref 1.7–7.7)
Neutrophils Relative %: 69 %
Platelets: 137 10*3/uL — ABNORMAL LOW (ref 150–400)
RBC: 4.56 MIL/uL (ref 4.22–5.81)
RDW: 13.2 % (ref 11.5–15.5)
WBC: 6.5 10*3/uL (ref 4.0–10.5)
nRBC: 0 % (ref 0.0–0.2)

## 2021-11-13 LAB — COMPREHENSIVE METABOLIC PANEL
ALT: 18 U/L (ref 0–44)
AST: 17 U/L (ref 15–41)
Albumin: 3.9 g/dL (ref 3.5–5.0)
Alkaline Phosphatase: 49 U/L (ref 38–126)
Anion gap: 9 (ref 5–15)
BUN: 21 mg/dL (ref 8–23)
CO2: 27 mmol/L (ref 22–32)
Calcium: 9 mg/dL (ref 8.9–10.3)
Chloride: 101 mmol/L (ref 98–111)
Creatinine, Ser: 1.19 mg/dL (ref 0.61–1.24)
GFR, Estimated: >60 mL/min (ref >60)
Glucose, Bld: 213 mg/dL — ABNORMAL HIGH (ref 70–99)
Potassium: 3.7 mmol/L (ref 3.5–5.1)
Sodium: 137 mmol/L (ref 135–145)
Total Bilirubin: 0.7 mg/dL (ref 0.3–1.2)
Total Protein: 6 g/dL — ABNORMAL LOW (ref 6.5–8.1)

## 2021-11-13 LAB — RESP PANEL BY RT-PCR (FLU A&B, COVID) ARPGX2
Influenza A by PCR: NEGATIVE
Influenza B by PCR: NEGATIVE
SARS Coronavirus 2 by RT PCR: NEGATIVE

## 2021-11-13 MED ORDER — AMOXICILLIN-POT CLAVULANATE 875-125 MG PO TABS: 1.0000 | ORAL_TABLET | Freq: Two times a day (BID) | ORAL | 0 refills | Status: DC

## 2021-11-13 MED ORDER — BENZONATATE 100 MG PO CAPS: 100.0000 mg | ORAL_CAPSULE | Freq: Two times a day (BID) | ORAL | 0 refills | Status: AC | PRN

## 2021-11-13 NOTE — ED Provider Notes (Signed)
[facility] EMERGENCY DEPT Provider Note   CSN: [id number] Arrival date & time: 11/13/21  1327     History Chief Complaint  Patient presents with   Cough   Shortness of Breath    Eric Chambers is a 75 y.o. male.  HPI  75 year old male with past medical history of HTN, HLD, sleep apnea [place] to the emergency department with nasal congestion, thick green snot, productive cough, chills and fatigue.  Patient states this is been going on for about 8 days.  Initially sputum was clear and has transition to a green color.  He has had chills at home with no documented fever.  Denies any GI symptoms.  Is been taking over-the-counter Mucinex and Tylenol/ibuprofen without significant improvement.  No active chest pain or back pain.  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 (seen on post tx echo as well)    Enlarged prostate    Heart murmur    "think I have one now" (08/16/2013)   High cholesterol    Hypertension    Pneumonia    "almost once/yr" (08/16/2013)   Sleep apnea    "don't wear mask" (08/16/2013)    Patient Active Problem List   Diagnosis Date Noted   Cough syncope 10/12/2018   Cough variant asthma vs UACS with cough syncope  10/11/2018   Hyperlipidemia 04/01/2015   Hypokalemia 08/29/2013   Anemia due to acute blood loss 08/29/2013   Diarrhea 08/29/2013   Vomiting 08/29/2013   OSA (obstructive sleep apnea) 08/16/2013   FUO (fever of unknown origin) 08/16/2013   Aortic regurgitation 08/16/2013    Class: Chronic   High cholesterol    Hypertension    Enlarged prostate    Endocarditis     Past Surgical History:  Procedure Laterality Date   INGUINAL HERNIA REPAIR Right ~ 1950   TEE WITHOUT CARDIOVERSION N/A 08/17/2013   Procedure: TRANSESOPHAGEAL ECHOCARDIOGRAM (TEE);  Surgeon: Candee Furbish, MD;  Location:  [facility] ENDOSCOPY;  Service: Cardiovascular;  Laterality: N/A;   TONSILLECTOMY  ~ 8657   UMBILICAL HERNIA REPAIR  ~ 2011       Family History  Problem Relation Age of Onset   Hypertension Mother    Heart attack Father 55    Social History   Tobacco Use   Smoking status: Never   Smokeless tobacco: Never  Vaping Use   Vaping Use: Never used  Substance Use Topics   Alcohol use: Yes    Alcohol/week: 2.0 standard drinks    Types: 2 Glasses of wine per week   Drug use: No    Home Medications Prior to Admission medications   Medication Sig Start Date End Date Taking? Authorizing Provider  amoxicillin-clavulanate (AUGMENTIN) 875-125 MG tablet Take 1 tablet by mouth every 12 (twelve) hours. 11/13/21  Yes Erum Cercone M, DO  benzonatate (TESSALON) 100 MG capsule Take 1 capsule (100 mg total) by mouth 2 (two) times daily as needed for up to 5 days for cough. 11/13/21 11/18/21 Yes Navie Lamoreaux, Alvin Critchley, DO  ibuprofen (ADVIL) 200 MG tablet Take 600 mg by mouth every 6 (six) hours as needed for moderate pain.   Yes [provider]  rosuvastatin (CRESTOR) 10 MG tablet Take 10 mg by mouth daily.   Yes [provider]  telmisartan-hydrochlorothiazide (MICARDIS HCT) 40-12.5 MG tablet Take 1  tablet by mouth daily.   Yes [provider]    Allergies    Patient has no known allergies.  Review of Systems   Review of Systems  Constitutional:  Positive for chills and fatigue. Negative for fever.  HENT:  Positive for congestion, postnasal drip, rhinorrhea, sinus pressure and sinus pain.   Eyes:  Negative for visual disturbance.  Respiratory:  Positive for cough. Negative for shortness of breath.   Cardiovascular:  Negative for chest pain, palpitations and leg swelling.  Gastrointestinal:  Negative for abdominal pain, diarrhea and vomiting.  Genitourinary:  Negative for dysuria.  Musculoskeletal:  Negative for back pain.  Skin:  Negative for rash.  Neurological:   Negative for headaches.   Physical Exam Updated Vital Signs BP (!) 118/58 (BP Location: Right Arm)   Pulse 65   Temp 98.1 F (36.7 C) (Oral)   Resp 17   Ht 5\' 8"  (1.727 m)   Wt 93 kg   SpO2 100%   BMI 31.17 kg/m   Physical Exam Vitals and nursing note reviewed.  Constitutional:      General: He is not in acute distress.    Appearance: Normal appearance.  HENT:     Head: Normocephalic.     Comments: Nasal congestion, rhinorrhea, tenderness to palpation of the maxillary and frontal sinuses    Mouth/Throat:     Mouth: Mucous membranes are moist.  Cardiovascular:     Rate and Rhythm: Normal rate.  Pulmonary:     Effort: Pulmonary effort is normal. No tachypnea or respiratory distress.     Breath sounds: No decreased breath sounds, wheezing or rales.  Abdominal:     Palpations: Abdomen is soft.     Tenderness: There is no abdominal tenderness.  Musculoskeletal:     Right lower leg: No edema.     Left lower leg: No edema.  Skin:    General: Skin is warm.  Neurological:     Mental Status: He is alert and oriented to person, place, and time. Mental status is at baseline.  Psychiatric:        Mood and Affect: Mood normal.    ED Results / Procedures / Treatments   Labs (all labs ordered are listed, but only abnormal results are displayed) Labs Reviewed  COMPREHENSIVE METABOLIC PANEL - Abnormal; Notable for the following components:      Result Value   Glucose, Bld 213 (*)    Total Protein 6.0 (*)    All other components within normal limits  CBC WITH DIFFERENTIAL/PLATELET - Abnormal; Notable for the following components:   Platelets 137 (*)    All other components within normal limits  RESP PANEL BY RT-PCR (FLU A&B, COVID) ARPGX2    EKG None  Radiology DG Chest 2 View  Result Date: 11/13/2021 CLINICAL DATA:  Productive cough. EXAM: CHEST - 2 VIEW COMPARISON:  December 21, 2018. FINDINGS: The heart size and mediastinal contours are within normal limits. Both lungs  are clear. The visualized skeletal structures are unremarkable. IMPRESSION: No active cardiopulmonary disease. Electronically Signed   By: Marijo Conception M.D.   On: 11/13/2021 14:34    Procedures Procedures   Medications Ordered in ED Medications - No data to display  ED Course  I have reviewed the triage vital signs and the nursing notes.  Pertinent labs & imaging results that were available during my care of the patient were reviewed by me and considered in my medical decision making (see chart for details).  MDM Rules/Calculators/A&P                           75 year old male presents emergency department sinus congestion, thick green snot/sputum, chills and fatigue.  Vitals are stable on arrival.  He is fatigued appearing but nontoxic.  No respiratory distress.  Blood work is reassuring.  Flu and COVID is negative.  Chest x-ray shows no focal pneumonia.  Patient appears to be suffering from sinusitis/upper respiratory infection.  Given that his symptoms have been ongoing for over 8 days, high risk for bacterial infection.  Plan for antibiotic therapy and outpatient follow-up.  Patient at this time appears safe and stable for discharge and will be treated as an outpatient.  Discharge plan and strict return to ED precautions discussed, patient verbalizes understanding and agreement.  Final Clinical Impression(s) / ED Diagnoses Final diagnoses:  Upper respiratory tract infection, unspecified type    Rx / DC Orders ED Discharge Orders          Ordered    amoxicillin-clavulanate (AUGMENTIN) 875-125 MG tablet  Every 12 hours        11/13/21 1912    benzonatate (TESSALON) 100 MG capsule  2 times daily PRN        11/13/21 1913             Lorelle Gibbs, DO 11/13/21 1919

## 2021-11-13 NOTE — ED Triage Notes (Signed)
Over last week shob, productive green cough, fever, lethargic

## 2021-11-13 NOTE — Discharge Instructions (Signed)
You have been seen and discharged from the emergency department.  You appear to have sinusitis/upper respiratory infection.  Take antibiotic as directed.  You may continue take over-the-counter medication for symptom control.  You may take Tylenol/ibuprofen as needed for fever/pain control.  Stay well-hydrated.  Take cough medicine at night as needed.  Follow-up with your primary provider for reevaluation and further care. Take home medications as prescribed. If you have any worsening symptoms or further concerns for your health please return to an emergency department for further evaluation.

## 2021-12-15 ENCOUNTER — Ambulatory Visit
Admission: RE | Admit: 2021-12-15 | Discharge: 2021-12-15 | Disposition: A | Discharge status: Home / Self Care | Payer: Medicare Other | Source: Ambulatory Visit | Attending: Family Medicine | Admitting: Family Medicine

## 2021-12-15 ENCOUNTER — Other Ambulatory Visit: Payer: Self-pay

## 2021-12-15 ENCOUNTER — Other Ambulatory Visit: Payer: Self-pay | Admitting: Family Medicine

## 2021-12-15 DIAGNOSIS — M25532 Pain in left wrist: Secondary | ICD-10-CM

## 2021-12-29 ENCOUNTER — Ambulatory Visit (INDEPENDENT_AMBULATORY_CARE_PROVIDER_SITE_OTHER): Payer: Medicare Other | Admitting: Cardiology

## 2021-12-29 ENCOUNTER — Other Ambulatory Visit: Payer: Self-pay

## 2021-12-29 ENCOUNTER — Encounter: Payer: Self-pay | Admitting: Cardiology

## 2021-12-29 ENCOUNTER — Ambulatory Visit (INDEPENDENT_AMBULATORY_CARE_PROVIDER_SITE_OTHER): Payer: Medicare Other

## 2021-12-29 VITALS — BP 100/60 | HR 65 | Ht 68.0 in | Wt 196.0 lb

## 2021-12-29 DIAGNOSIS — J45991 Cough variant asthma: Secondary | ICD-10-CM | POA: Diagnosis not present

## 2021-12-29 DIAGNOSIS — I351 Nonrheumatic aortic (valve) insufficiency: Secondary | ICD-10-CM

## 2021-12-29 DIAGNOSIS — E7800 Pure hypercholesterolemia, unspecified: Secondary | ICD-10-CM

## 2021-12-29 DIAGNOSIS — I10 Essential (primary) hypertension: Secondary | ICD-10-CM

## 2021-12-29 DIAGNOSIS — R55 Syncope and collapse: Secondary | ICD-10-CM

## 2021-12-29 DIAGNOSIS — R054 Cough syncope: Secondary | ICD-10-CM

## 2021-12-29 NOTE — Patient Instructions (Addendum)
Medication Instructions: The current medical regimen is effective;  continue present plan and medications.  *If you need a refill on your cardiac medications before your next appointment, please call your pharmacy*   Lab Work: NONE If you have labs (blood work) drawn today and your tests are completely normal, you will receive your results only by: [place] (if you have MyChart) OR A paper copy in the mail If you have any lab test that is abnormal or we need to change your treatment, we will call you to review the results.   Testing/Procedures: Zio Cardiac monitor x 14 days You have been referred to pulmonology and will be contacted to schedule that appt   Follow-Up: At [facility], you and your health needs are our priority.  As part of our continuing mission to provide you with exceptional heart care, we have created designated Provider Care Teams.  These Care Teams include your primary Cardiologist (physician) and Advanced Practice Providers (APPs -  Physician Assistants and Nurse Practitioners) who all work together to provide you with the care you need, when you need it.  Your next appointment:   6 month(s)  The format for your next appointment:   In Person  Provider:   Candee Furbish, MD    Other Instructions: Bryn Gulling- Long Term Monitor Instructions  Your physician has requested you wear a ZIO patch monitor for 14 days.  This is a single patch monitor. [employer] supplies one patch monitor per enrollment. Additional stickers are not available. Please do not apply patch if you will be having a Nuclear Stress Test,  Echocardiogram, Cardiac CT, MRI, or Chest Xray during the period you would be wearing the  monitor. The patch cannot be worn during these tests. You cannot remove and re-apply the  ZIO XT patch monitor.  Your ZIO patch monitor will be mailed 3 day USPS to your address on file. It may take 3-5 days  to receive your monitor after you have been enrolled.  Once  you have received your monitor, please review the enclosed instructions. Your monitor  has already been registered assigning a specific monitor serial # to you.  Billing and Patient Assistance Program Information  We have supplied [employer] with any of your insurance information on file for billing purposes. [employer] offers a sliding scale Patient Assistance Program for patients that do not have  insurance, or whose insurance does not completely cover the cost of the ZIO monitor.  You must apply for the Patient Assistance Program to qualify for this discounted rate.  To apply, please call [employer] at [phone], select option 4, select option 2, ask to apply for  Patient Assistance Program. Theodore Demark will ask your household income, and how many people  are in your household. They will quote your out-of-pocket cost based on that information.  [employer] will also be able to set up a 48-month, interest-free payment plan if needed.  Applying the monitor   Shave hair from upper left chest.  Hold abrader disc by orange tab. Rub abrader in 40 strokes over the upper left chest as  indicated in your monitor instructions.  Clean area with 4 enclosed alcohol pads. Let dry.  Apply patch as indicated in monitor instructions. Patch will be placed under collarbone on left  side of chest with arrow pointing upward.  Rub patch adhesive wings for 2 minutes. Remove white label marked "1". Remove the white  label marked "2". Rub patch adhesive wings for 2 additional minutes.  While  looking in a mirror, press and release button in center of patch. A small green light will  flash 3-4 times. This will be your only indicator that the monitor has been turned on.  Do not shower for the first 24 hours. You may shower after the first 24 hours.  Press the button if you feel a symptom. You will hear a small click. Record Date, Time and  Symptom in the Patient Logbook.  When you are ready to remove the patch, follow  instructions on the last 2 pages of Patient  Logbook. Stick patch monitor onto the last page of Patient Logbook.  Place Patient Logbook in the blue and white box. Use locking tab on box and tape box closed  securely. The blue and white box has prepaid postage on it. Please place it in the mailbox as  soon as possible. Your physician should have your test results approximately 7 days after the  monitor has been mailed back to [facility].  Call [place] at [phone] if you have questions regarding  your ZIO XT patch monitor. Call them immediately if you see an orange light blinking on your  monitor.  If your monitor falls off in less than 4 days, contact our Monitor department at [phone].  If your monitor becomes loose or falls off after 4 days call [employer] at [phone] for  suggestions on securing your monitor

## 2021-12-29 NOTE — Progress Notes (Signed)
Cardiology Office Note:    Date:  12/29/2021   ID:  Eric Chambers, DOB 1946/02/16, MRN [id number]  PCP:  Lawerance Cruel, MD   [facility] Cardiologist:  Candee Furbish, MD     Referring MD: Lawerance Cruel, MD    History of Present Illness:    Eric Chambers is a 76 y.o. male here for evaluation of recent syncopal episode.  Had a coughing spell, fell face forward into table, facial ecchymosis.  He states that in 2018 he had a spell similar to this.  In regards to his cough, he has been taking Tessalon Perles which have been somewhat effective.  Recent viral infection/sinus infection.  Mucinex he states can help him as well.  He also has a prior history of endocarditis in [place] and again here in 2014 treated with IV antibiotics..  Has moderate aortic valve regurgitation.  He does not have any significant aortic stenosis.  There was a TEE performed here in 2014 which I personally reviewed demonstrating a trileaflet aortic valve.  There is not appear to be any fusion of any leaflet segments.  His moderate regurgitation has been stable over the past several years.  Previously had a discussion with Dr. Cyndia Bent in 2019 regarding potential timing of surgery and agree that we are not at that point yet due to lack of symptoms.  He reports that he is quite vigorous, still maintaining his current level of activity [place] the [place], [place] [employer] with his daughter Eric Chambers.  He also goes to the [employer].  Uses treadmill.  Has not noticed any increasing shortness of breath.  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 (seen on post tx echo as well)    Enlarged prostate    Heart murmur    "think I have one now" (08/16/2013)   High cholesterol    Hypertension    Pneumonia    "almost once/yr" (08/16/2013)   Sleep  apnea    "don't wear mask" (08/16/2013)    Past Surgical History:  Procedure Laterality Date   INGUINAL HERNIA REPAIR Right ~ 1950   TEE WITHOUT CARDIOVERSION N/A 08/17/2013   Procedure: TRANSESOPHAGEAL ECHOCARDIOGRAM (TEE);  Surgeon: Candee Furbish, MD;  Location: [facility] ENDOSCOPY;  Service: Cardiovascular;  Laterality: N/A;   TONSILLECTOMY  ~ 8841   UMBILICAL HERNIA REPAIR  ~ 2011    Current Medications: Current Meds  Medication Sig   amoxicillin-clavulanate (AUGMENTIN) 875-125 MG tablet Take 1 tablet by mouth every 12 (twelve) hours.   ibuprofen (ADVIL) 200 MG tablet Take 600 mg by mouth every 6 (six) hours as needed for moderate pain.   rosuvastatin (CRESTOR) 10 MG tablet Take 10 mg by mouth daily.   telmisartan-hydrochlorothiazide (MICARDIS HCT) 80-12.5 MG tablet Take 1 tablet by mouth daily.     Allergies:   Patient has no known allergies.   Social History   Socioeconomic History   Marital status: Married    Spouse name: Not on file   Number of children: Not on file   Years of education: Not on file   Highest education level: Not on file  Occupational History   Not on file  Tobacco Use   Smoking status: Never   Smokeless tobacco: Never  Vaping Use   Vaping Use: Never used  Substance  and Sexual Activity   Alcohol use: Yes    Alcohol/week: 2.0 standard drinks    Types: 2 Glasses of wine per week   Drug use: No   Sexual activity: Yes  Other Topics Concern   Not on file  Social History Narrative   Not on file   Social Determinants of Health   Financial Resource Strain: Not on file  Food Insecurity: Not on file  Transportation Needs: Not on file  Physical Activity: Not on file  Stress: Not on file  Social Connections: Not on file     Family History: The patient's family history includes Heart attack (age of onset: 53) in his father; Hypertension in his mother.  ROS:   Please see the history of present illness.   Denies any recent fevers night sweats weight  loss  All other systems reviewed and are negative.  EKGs/Labs/Other Studies Reviewed:    The following studies were reviewed today:  ECHO [facility] 02/2021: Echo complete  Result Value Ref Range  LV Ejection Fraction (%) 55 %  Right Ventricle Systolic Pressure (mmHg) 16 mmHg  Left Atrium Diameter (cm) 3.7 cm  LV End Diastolic Diameter (cm) 4.4 cm  LV End Systolic Diameter (cm) 1.9 cm  LV Septum Wall Thickness (cm) 1.3 cm  LV Posterior Wall Thickness (cm) 0.9 cm  Tricuspid Valve Regurgitation Grade trivial  Tricuspid Valve Regurgitation Max Velocity (m/s) 1.8 m/s  Mitral Valve Regurgitation Grade none  Mitral Valve Stenosis Grade none  Aortic Valve Regurgitation Grade moderate  Aortic Valve Stenosis Grade none   EKG:  EKG is  ordered today.  The ekg ordered today demonstrates SR 65, borderline first-degree AV block with PR interval of 200 ms.  Recent Labs: 11/13/2021: ALT 18; BUN 21; Creatinine, Ser 1.19; Hemoglobin 14.0; Platelets 137; Potassium 3.7; Sodium 137  Recent Lipid Panel    Component Value Date/Time   CHOL 157 01/15/2014 0803   TRIG 126.0 01/15/2014 0803   HDL 41.60 01/15/2014 0803   CHOLHDL 4 01/15/2014 0803   VLDL 25.2 01/15/2014 0803   LDLCALC 90 01/15/2014 0803   LDLDIRECT 161.0 10/09/2013 1050     Risk Assessment/Calculations:              Physical Exam:    VS:  BP 100/60 (BP Location: Left Arm, Patient Position: Sitting, Cuff Size: Normal)    Pulse 65    Ht 5\' 8"  (1.727 m)    Wt 196 lb (88.9 kg)    SpO2 96%    BMI 29.80 kg/m     Wt Readings from Last 3 Encounters:  12/29/21 196 lb (88.9 kg)  11/13/21 205 lb (93 kg)  01/24/20 199 lb 9.6 oz (90.5 kg)     GEN:  Well nourished, well developed in no acute distress HEENT: Normal NECK: No JVD; No carotid bruits LYMPHATICS: No lymphadenopathy CARDIAC: RRR, faint diastolic murmur, no rubs, gallops RESPIRATORY:  Clear to auscultation without rales, wheezing or rhonchi  ABDOMEN: Soft, non-tender,  non-distended MUSCULOSKELETAL:  No edema; No deformity  SKIN: Warm and dry NEUROLOGIC:  Alert and oriented x 3 PSYCHIATRIC:  Normal affect   ASSESSMENT:    1. Syncope and collapse   2. Cough variant asthma vs UACS with cough syncope    3. Cough syncope   4. Nonrheumatic aortic valve insufficiency   5. Primary hypertension   6. Pure hypercholesterolemia    PLAN:    In order of problems listed above:  Cough syncope He had a bout  in 2018 he states as well.  Most recent bout most likely cough related syncope in the setting of vigorous cough/Valsalva type maneuver and likely concomitant dehydration.  Recent echocardiogram performed at [facility] reviewed in 2022.  There is no evidence of aortic stenosis.  There is continued moderate aortic regurgitation.  This has been quite stable.  Normal ejection fraction.  I will place a Zio patch monitor to ensure that there is no evidence of adverse arrhythmias as well.  Once again this is most likely cough related syncope.  Aortic regurgitation Stable, moderate.  Last echocardiogram [facility] 2022 reviewed.  Stable.  As of note, TEE from 2014 personally reviewed.  He does in fact have a trileaflet or tricuspid aortic valve.  His exercise tolerance has not seemed to decrease.  He will monitor this for any signs of shortness of breath.  Echo parameters are reassuring.  Obviously if symptoms were to recur and aortic regurgitation were to progress to severe, surgical correction of valve is indicated.  Cough variant asthma vs UACS with cough syncope  I am going to refer to pulmonary for further evaluation.  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  Hypertension Currently well controlled on telmisartan hydrochlorothiazide 80/12.5.  No changes made.  If blood pressure becomes too low, would suggest stopping the HCT component medication, 12.5 mg.  He had been  noticing at home systolic pressures in the 140s and hence the increase in the medication to 80.  We will continue to monitor.  Encouraged him to stay hydrated.  Hyperlipidemia Continue with Crestor 10 mg.  Last LDL 81.           Medication Adjustments/Labs and Tests Ordered: Current medicines are reviewed at length with the patient today.  Concerns regarding medicines are outlined above.  Orders Placed This Encounter  Procedures   Ambulatory referral to Pulmonology   LONG TERM MONITOR (3-14 DAYS)   EKG 12-Lead   No orders of the defined types were placed in this encounter.   Patient Instructions  Medication Instructions: The current medical regimen is effective;  continue present plan and medications.  *If you need a refill on your cardiac medications before your next appointment, please call your pharmacy*   Lab Work: NONE If you have labs (blood work) drawn today and your tests are completely normal, you will receive your results only by: [place] (if you have MyChart) OR A paper copy in the mail If you have any lab test that is abnormal or we need to change your treatment, we will call you to review the results.   Testing/Procedures: Zio Cardiac monitor x 14 days You have been referred to pulmonology and will be contacted to schedule that appt   Follow-Up: At [facility], you and your health needs are our priority.  As part of our continuing mission to provide you with exceptional heart care, we have created designated Provider Care Teams.  These Care Teams include your primary Cardiologist (physician) and Advanced Practice Providers (APPs -  Physician Assistants and Nurse Practitioners) who all work together to provide you with the care you need, when you need it.  Your next appointment:   6 month(s)  The format for your next appointment:   In Person  Provider:   Candee Furbish, MD    Other Instructions: Bryn Gulling- Long Term Monitor Instructions  Your  physician has requested you wear a  ZIO patch monitor for 14 days.  This is a single patch monitor. [employer] supplies one patch monitor per enrollment. Additional stickers are not available. Please do not apply patch if you will be having a Nuclear Stress Test,  Echocardiogram, Cardiac CT, MRI, or Chest Xray during the period you would be wearing the  monitor. The patch cannot be worn during these tests. You cannot remove and re-apply the  ZIO XT patch monitor.  Your ZIO patch monitor will be mailed 3 day USPS to your address on file. It may take 3-5 days  to receive your monitor after you have been enrolled.  Once you have received your monitor, please review the enclosed instructions. Your monitor  has already been registered assigning a specific monitor serial # to you.  Billing and Patient Assistance Program Information  We have supplied [employer] with any of your insurance information on file for billing purposes. [employer] offers a sliding scale Patient Assistance Program for patients that do not have  insurance, or whose insurance does not completely cover the cost of the ZIO monitor.  You must apply for the Patient Assistance Program to qualify for this discounted rate.  To apply, please call [employer] at [phone], select option 4, select option 2, ask to apply for  Patient Assistance Program. Theodore Demark will ask your household income, and how many people  are in your household. They will quote your out-of-pocket cost based on that information.  [employer] will also be able to set up a 9-month, interest-free payment plan if needed.  Applying the monitor   Shave hair from upper left chest.  Hold abrader disc by orange tab. Rub abrader in 40 strokes over the upper left chest as  indicated in your monitor instructions.  Clean area with 4 enclosed alcohol pads. Let dry.  Apply patch as indicated in monitor instructions. Patch will be placed under collarbone on left  side of chest with arrow  pointing upward.  Rub patch adhesive wings for 2 minutes. Remove white label marked "1". Remove the white  label marked "2". Rub patch adhesive wings for 2 additional minutes.  While looking in a mirror, press and release button in center of patch. A small green light will  flash 3-4 times. This will be your only indicator that the monitor has been turned on.  Do not shower for the first 24 hours. You may shower after the first 24 hours.  Press the button if you feel a symptom. You will hear a small click. Record Date, Time and  Symptom in the Patient Logbook.  When you are ready to remove the patch, follow instructions on the last 2 pages of Patient  Logbook. Stick patch monitor onto the last page of Patient Logbook.  Place Patient Logbook in the blue and white box. Use locking tab on box and tape box closed  securely. The blue and white box has prepaid postage on it. Please place it in the mailbox as  soon as possible. Your physician should have your test results approximately 7 days after the  monitor has been mailed back to [facility].  Call [place] at [phone] if you have questions regarding  your ZIO XT patch monitor. Call them immediately if you see an orange light blinking on your  monitor.  If your monitor falls off in less than 4 days, contact our Monitor department at [phone].  If your monitor becomes loose or falls off after 4 days call [employer] at [phone] for  suggestions on securing your monitor     Signed, Candee Furbish, MD  12/29/2021 5:53 PM    [facility]  Has appt in April will keep

## 2021-12-29 NOTE — Assessment & Plan Note (Signed)
Continue with Crestor 10 mg.  Last LDL 81.

## 2021-12-29 NOTE — Assessment & Plan Note (Addendum)
Stable, moderate.  Last echocardiogram [facility] 2022 reviewed.  Stable.  As of note, TEE from 2014 personally reviewed.  He does in fact have a trileaflet or tricuspid aortic valve.  His exercise tolerance has not seemed to decrease.  He will monitor this for any signs of shortness of breath.  Echo parameters are reassuring.  Obviously if symptoms were to recur and aortic regurgitation were to progress to severe, surgical correction of valve is indicated.

## 2021-12-29 NOTE — Assessment & Plan Note (Signed)
I am going to refer to pulmonary for further evaluation.  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

## 2021-12-29 NOTE — Assessment & Plan Note (Addendum)
Currently well controlled on telmisartan hydrochlorothiazide 80/12.5.  No changes made.  If blood pressure becomes too low, would suggest stopping the HCT component medication, 12.5 mg.  He had been noticing at home systolic pressures in the 140s and hence the increase in the medication to 80.  We will continue to monitor.  Encouraged him to stay hydrated.

## 2021-12-29 NOTE — Assessment & Plan Note (Signed)
He had a bout in 2018 he states as well.  Most recent bout most likely cough related syncope in the setting of vigorous cough/Valsalva type maneuver and likely concomitant dehydration.  Recent echocardiogram performed at [facility] reviewed in 2022.  There is no evidence of aortic stenosis.  There is continued moderate aortic regurgitation.  This has been quite stable.  Normal ejection fraction.  I will place a Zio patch monitor to ensure that there is no evidence of adverse arrhythmias as well.  Once again this is most likely cough related syncope.

## 2021-12-29 NOTE — Progress Notes (Unsigned)
[id number] 14 day ZIO XT from office inventory applied to patient.

## 2022-01-12 DIAGNOSIS — R55 Syncope and collapse: Secondary | ICD-10-CM

## 2022-01-18 DIAGNOSIS — I471 Supraventricular tachycardia, unspecified: Secondary | ICD-10-CM

## 2022-01-18 HISTORY — DX: Supraventricular tachycardia, unspecified: I47.10

## 2022-01-18 HISTORY — DX: Supraventricular tachycardia: I47.1

## 2022-01-29 ENCOUNTER — Other Ambulatory Visit: Payer: Self-pay | Admitting: Urology

## 2022-01-29 DIAGNOSIS — R972 Elevated prostate specific antigen [PSA]: Secondary | ICD-10-CM

## 2022-02-14 ENCOUNTER — Other Ambulatory Visit: Payer: Self-pay

## 2022-02-14 ENCOUNTER — Ambulatory Visit
Admission: RE | Admit: 2022-02-14 | Discharge: 2022-02-14 | Disposition: A | Discharge status: Home / Self Care | Payer: Medicare Other | Source: Ambulatory Visit | Attending: Urology | Admitting: Urology

## 2022-02-14 DIAGNOSIS — R972 Elevated prostate specific antigen [PSA]: Secondary | ICD-10-CM

## 2022-02-14 MED ORDER — GADOBENATE DIMEGLUMINE 529 MG/ML IV SOLN
20.0000 mL | Freq: Once | INTRAVENOUS | Status: AC | PRN
  Administered 2022-02-14: 20 mL via INTRAVENOUS

## 2022-02-18 NOTE — Progress Notes (Signed)
? ?Synopsis: Referred for chronic cough by Jerline Pain, MD ? ?Subjective:  ? ?PATIENT ID: Eric Chambers GENDER: male DOB: May 16, 1946, MRN: [id number] ? ?Chief Complaint  ?Patient presents with  ? Pulmonary Consult  ?  Referred by Dr. Candee Furbish. Pt c/o cough "for years"- occurs every Winter. Cough is non prod. He says he does not currently have the cough.   ? ?76yM with AR, endocarditis, HTN, frequent pneumonia, OSA, GERD referred for cough - saw Dr. Melvyn Novas in past ? ?He says that he had pneumonia in the [place] 65 ya. Says he just has a hard time clearing chest colds when he gets them. Has had suspected cough-related syncope. ? ?If he takes a double dose of tessalon perles it knocks out cough.  ? ?He has been on singulair for about a month. He's unsure if it's been helpful for his cough but his cough did improve over the course of the month that he's been on it. ? ?He was having quite a bit of sinus congestion and postnasal drainage prior to starting  ? ?Otherwise pertinent review of systems is negative. ? ?Past Medical History:  ?Diagnosis Date  ? Aortic valve regurgitation   ? Cancer [facility])   ? squamous cell x2  ? Endocarditis   ? Endocarditis   ? 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 Enlarged prostate   ? Heart murmur   ? "think I have one now" (08/16/2013)  ? High cholesterol   ? Hypertension   ? Pneumonia   ? "almost once/yr" (08/16/2013)  ? Sleep apnea   ? "don't wear mask" (08/16/2013)  ?  ? ?Family History  ?Problem Relation Age of Onset  ? Hypertension Mother   ? Heart attack Father 35  ?  ? ?Past Surgical History:  ?Procedure Laterality Date  ? INGUINAL HERNIA REPAIR Right ~ 1950  ? TEE WITHOUT CARDIOVERSION N/A 08/17/2013  ? Procedure: TRANSESOPHAGEAL ECHOCARDIOGRAM (TEE);  Surgeon: Candee Furbish, MD;  Location: [facility] ENDOSCOPY;  Service: Cardiovascular;  Laterality: N/A;  ? TONSILLECTOMY  ~ 1957  ? UMBILICAL HERNIA REPAIR  ~ 2011  ? ? ?Social History   ? ?Socioeconomic History  ? Marital status: Married  ?  Spouse name: Not on file  ? Number of children: Not on file  ? Years of education: Not on file  ? Highest education level: Not on file  ?Occupational History  ? Not on file  ?Tobacco Use  ? Smoking status: Never  ? Smokeless tobacco: Never  ?Vaping Use  ? Vaping Use: Never used  ?Substance and Sexual Activity  ? Alcohol use: Yes  ?  Alcohol/week: 2.0 standard drinks  ?  Types: 2 Glasses of wine per week  ? Drug use: No  ? Sexual activity: Yes  ?Other Topics Concern  ? Not on file  ?Social History Narrative  ? Not on file  ? ?Social Determinants of Health  ? ?Financial Resource Strain: Not on file  ?Food Insecurity: Not on file  ?Transportation Needs: Not on file  ?Physical Activity: Not on file  ?Stress: Not on file  ?Social Connections: Not on file  ?Intimate Partner Violence: Not on file  ?  ? ?No Known Allergies  ? ?Outpatient Medications Prior to Visit  ?Medication Sig Dispense Refill  ? benzonatate (TESSALON) 200 MG capsule Take 200 mg by mouth 3 (three) times daily as needed for cough.    ? chlorpheniramine (CHLOR-TRIMETON) 4 MG tablet  Take 4 mg by mouth every 4 (four) hours as needed for allergies.    ? loratadine (CLARITIN) 10 MG tablet Take 10 mg by mouth daily.    ? montelukast (SINGULAIR) 10 MG tablet Take 10 mg by mouth at bedtime.    ? rosuvastatin (CRESTOR) 10 MG tablet Take 10 mg by mouth daily.    ? valsartan-hydrochlorothiazide (DIOVAN-HCT) 320-12.5 MG tablet Take 1 tablet by mouth daily.    ? amoxicillin-clavulanate (AUGMENTIN) 875-125 MG tablet Take 1 tablet by mouth every 12 (twelve) hours. 14 tablet 0  ? ibuprofen (ADVIL) 200 MG tablet Take 600 mg by mouth every 6 (six) hours as needed for moderate pain.    ? telmisartan-hydrochlorothiazide (MICARDIS HCT) 80-12.5 MG tablet Take 1 tablet by mouth daily.    ? ?No facility-administered medications prior to visit.  ? ? ? ? ? ?Objective:  ? ?Physical Exam: ? ?General appearance: 76 y.o., male,  NAD, conversant  ?Eyes: anicteric sclerae; PERRL, tracking appropriately ?HENT: NCAT; MMM +nasal polyps left nare ?Neck: Trachea midline; no lymphadenopathy, no JVD ?Lungs: CTAB, no crackles, no wheeze, with normal respiratory effort ?CV: RRR, no murmur  ?Abdomen: Soft, non-tender; non-distended, BS present  ?Extremities: No peripheral edema, warm ?Skin: Normal turgor and texture; no rash ?Psych: Appropriate affect ?Neuro: Alert and oriented to person and place, no focal deficit  ? ? ? ?Vitals:  ? 02/19/22 1029  ?BP: 122/70  ?Pulse: 64  ?Temp: 98 ?F (36.7 ?C)  ?TempSrc: Oral  ?SpO2: 98%  ?Weight: 200 lb 3.2 oz (90.8 kg)  ?Height: 5\' 8"  (1.727 m)  ? ?98% on RA ?BMI Readings from Last 3 Encounters:  ?02/19/22 30.44 kg/m?  ?12/29/21 29.80 kg/m?  ?11/13/21 31.17 kg/m?  ? ?Wt Readings from Last 3 Encounters:  ?02/19/22 200 lb 3.2 oz (90.8 kg)  ?12/29/21 196 lb (88.9 kg)  ?11/13/21 205 lb (93 kg)  ? ? ? ?CBC ?   ?Component Value Date/Time  ? WBC 6.5 11/13/2021 1814  ? RBC 4.56 11/13/2021 1814  ? HGB 14.0 11/13/2021 1814  ? HCT 41.2 11/13/2021 1814  ? PLT 137 (L) 11/13/2021 1814  ? MCV 90.4 11/13/2021 1814  ? MCH 30.7 11/13/2021 1814  ? MCHC 34.0 11/13/2021 1814  ? RDW 13.2 11/13/2021 1814  ? LYMPHSABS 1.5 11/13/2021 1814  ? MONOABS 0.3 11/13/2021 1814  ? EOSABS 0.1 11/13/2021 1814  ? BASOSABS 0.0 11/13/2021 1814  ? ? ?Eos 0-100 ?IgE 11 in 2019 with negative allergy panel ? ?Chest Imaging: ?CXR 11/13/21 reviewed by me unremarkable ? ?Pulmonary Functions Testing Results: ?No flowsheet data found. ? ?   ?Assessment & Plan:  ? ?# Chronic cough: ?# CRS with nasal polyps left nare: ?# Postnasal drainage ?Has responded well to addition of singulair and it's actually well suited to his having nasal polyps. ? ?Plan: ?- nasal irrigation  ?- flonase 1 spray each nare after nasal irrigation ?- add singulair 10 mg daily ?- continue this regimen whenever cough/postnasal drainage active for at least 8 weeks or until cough improvement,  whichever is longer ?- may need to revisit gerd mgmt if cough active next visit, consider PFT next visit particularly if cough active again ?- tessalon perles as needed   ? ?RTC 6 months ? ? ?Maryjane Hurter, MD ?[facility] ?02/19/2022 10:34 AM  ? ?

## 2022-02-19 ENCOUNTER — Encounter: Payer: Self-pay | Admitting: Student

## 2022-02-19 ENCOUNTER — Ambulatory Visit (INDEPENDENT_AMBULATORY_CARE_PROVIDER_SITE_OTHER): Payer: Medicare Other | Admitting: Student

## 2022-02-19 ENCOUNTER — Other Ambulatory Visit: Payer: Self-pay

## 2022-02-19 VITALS — BP 122/70 | HR 64 | Temp 98.0°F | Ht 68.0 in | Wt 200.2 lb

## 2022-02-19 DIAGNOSIS — R053 Chronic cough: Secondary | ICD-10-CM

## 2022-02-19 DIAGNOSIS — J339 Nasal polyp, unspecified: Secondary | ICD-10-CM | POA: Diagnosis not present

## 2022-02-19 MED ORDER — FLUTICASONE PROPIONATE 50 MCG/ACT NA SUSP: 1.0000 | Freq: Every day | NASAL | 11 refills | Status: DC

## 2022-02-19 NOTE — Patient Instructions (Addendum)
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 Need to continue this regimen for at least 8 weeks to kick your cough and postnasal drainage completely. We may also need to revisit reflux management. Could also see ENT down the road if we have trouble or consider PFTs (breathing tests).  ?

## 2022-03-23 ENCOUNTER — Ambulatory Visit (INDEPENDENT_AMBULATORY_CARE_PROVIDER_SITE_OTHER): Payer: Medicare Other | Admitting: Cardiology

## 2022-03-23 ENCOUNTER — Encounter: Payer: Self-pay | Admitting: Cardiology

## 2022-03-23 DIAGNOSIS — I351 Nonrheumatic aortic (valve) insufficiency: Secondary | ICD-10-CM | POA: Diagnosis not present

## 2022-03-23 DIAGNOSIS — E7800 Pure hypercholesterolemia, unspecified: Secondary | ICD-10-CM | POA: Diagnosis not present

## 2022-03-23 DIAGNOSIS — G4733 Obstructive sleep apnea (adult) (pediatric): Secondary | ICD-10-CM | POA: Diagnosis not present

## 2022-03-23 NOTE — Assessment & Plan Note (Addendum)
Moderate aortic valve regurgitation.  Currently asymptomatic.  We will check an echocardiogram in 6 months.  We will see him back in 6 months.  His last echocardiogram was in March 2022 at [facility]. ? ?At this time his left ventricular dimensions are normal.  Asymptomatic.  Moderate aortic regurgitation.  No indications for surgery at this time.  We will continue to monitor. ?

## 2022-03-23 NOTE — Assessment & Plan Note (Signed)
Currently doing well with treatment. ?

## 2022-03-23 NOTE — Assessment & Plan Note (Signed)
LDL 81 HDL 43 total cholesterol 145 triglycerides 120 from outside labs, ALT 27.  Excellent.  Continue with Crestor 10 mg once a day. ?

## 2022-03-23 NOTE — Patient Instructions (Signed)
Medication Instructions:  The current medical regimen is effective;  continue present plan and medications.  *If you need a refill on your cardiac medications before your next appointment, please call your pharmacy*  Testing/Procedures: Your physician has requested that you have an echocardiogram in 6 months. Echocardiography is a painless test that uses sound waves to create images of your heart. It provides your doctor with information about the size and shape of your heart and how well your heart's chambers and valves are working. This procedure takes approximately one hour. There are no restrictions for this procedure.  Follow-Up: At [facility], you and your health needs are our priority.  As part of our continuing mission to provide you with exceptional heart care, we have created designated Provider Care Teams.  These Care Teams include your primary Cardiologist (physician) and Advanced Practice Providers (APPs -  Physician Assistants and Nurse Practitioners) who all work together to provide you with the care you need, when you need it.  We recommend signing up for the patient portal called "MyChart".  Sign up information is provided on this After Visit Summary.  MyChart is used to connect with patients for Virtual Visits (Telemedicine).  Patients are able to view lab/test results, encounter notes, upcoming appointments, etc.  Non-urgent messages can be sent to your provider as well.   To learn more about what you can do with MyChart, go to [email or web address].    Your next appointment:   6 month(s)  The format for your next appointment:   In Person  Provider:   Mark Skains, MD   Thank you for choosing [facility]!!     

## 2022-03-23 NOTE — Assessment & Plan Note (Signed)
Prior aortic valve endocarditis. ?

## 2022-03-23 NOTE — Progress Notes (Signed)
?Cardiology Office Note:   ? ?Date:  03/23/2022  ? ?ID:  Eric Chambers, DOB Feb 17, 1946, MRN [id number] ? ?PCP:  Eric Cruel, MD ?  ?[facility] Providers ?Cardiologist:  Eric Furbish, MD    ? ?Referring MD: Eric Cruel, MD  ? ? ?History of Present Illness:   ? ?Eric Chambers is a 76 y.o. male here for the follow-up of syncope and to discuss Zio monitor results. ? ?Previously here for evaluation of recent syncopal episode.  Had a coughing spell, fell face forward into table, facial ecchymosis.  He states that in 2018 he had a spell similar to this. ? ?He also has a prior history of endocarditis in [place] and again here in 2014 treated with IV antibiotics..  Has moderate aortic valve regurgitation.  He does not have any significant aortic stenosis.  There was a TEE performed here in 2014 which I personally reviewed demonstrating a trileaflet aortic valve.  There is not appear to be any fusion of any leaflet segments.  His moderate regurgitation has been stable over the past several years. ? ?Previously had a discussion with Dr. Cyndia Chambers in 2019 regarding potential timing of surgery and agree that we are not at that point yet due to lack of symptoms. ? 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  He also goes to the [employer].  Uses treadmill.  Had not noticed any increasing shortness of breath. ? ?Today: ?Overall, he is feeling very good. We reviewed his monitor results in detail. ? 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 Generally he feels a little more stiff, unable to lift as much weight as he used to. ? ?In December 2022 his LDL was 81, currently on 10 mg rosuvastatin. Originally on lipitor 20 mg, but this caused LE muscle cramps in his calves. ? ?He endorses sleep apnea and snoring. He has woken himself up due to nocturnal breathing cessation. ? ?He denies any  palpitations, chest pain, or peripheral edema. No lightheadedness, headaches, syncope. ? ?Recently he saw his pulmonologist for his cough, and was advised to start Flonase. ? ?Recently enjoyed a trip with [place] to [place].  She was there when she was 76 years old. ? ?Past Medical History:  ?Diagnosis Date  ? Aortic valve regurgitation   ? Cancer [facility])   ? squamous cell x2  ? Endocarditis   ? Endocarditis   ? 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 Enlarged prostate   ? Heart murmur   ? "think I have one now" (08/16/2013)  ? High cholesterol   ? Hypertension   ? Pneumonia   ? "almost once/yr" (08/16/2013)  ? Sleep apnea   ? "don't wear mask" (08/16/2013)  ? ? ?Past Surgical History:  ?Procedure Laterality Date  ? INGUINAL HERNIA REPAIR Right ~ 1950  ? TEE WITHOUT CARDIOVERSION N/A 08/17/2013  ? Procedure: TRANSESOPHAGEAL ECHOCARDIOGRAM (TEE);  Surgeon: Eric Furbish, MD;  [facility] END[facility]COPY;  Service: Cardiovascular;  Laterality: N/A;  ? TONSILLECTOMY  ~ 1957  ? UMBILICAL HERNIA REPAIR  ~ 2011  ? ? ?Current Medications: ?Current Meds  ?Medication Sig  ? benzonatate (TESSALON) 200 MG capsule Take 200 mg by mouth 3 (three) times daily as needed for cough.  ? chlorpheniramine (CHLOR-TRIMETON) 4 MG tablet Take 4 mg by mouth every 4 (four) hours as  needed for allergies.  ? fluticasone (FLONASE) 50 MCG/ACT nasal spray Place 1 spray into both nostrils daily.  ? loratadine (CLARITIN) 10 MG tablet Take 10 mg by mouth daily.  ? montelukast (SINGULAIR) 10 MG tablet Take 10 mg by mouth at bedtime.  ? rosuvastatin (CRESTOR) 10 MG tablet Take 10 mg by mouth daily.  ? valsartan-hydrochlorothiazide (DIOVAN-HCT) 320-12.5 MG tablet Take 1 tablet by mouth daily.  ?  ? ?Allergies:   Patient has no known allergies.  ? ?Social History  ? ?Socioeconomic History  ? Marital status: Married  ?  Spouse name: Not on file  ? Number of children: Not on file  ? Years of education: Not on file  ? Highest  education level: Not on file  ?Occupational History  ? Not on file  ?Tobacco Use  ? Smoking status: Never  ? Smokeless tobacco: Never  ?Vaping Use  ? Vaping Use: Never used  ?Substance and Sexual Activity  ? Alcohol use: Yes  ?  Alcohol/week: 2.0 standard drinks  ?  Types: 2 Glasses of wine per week  ? Drug use: No  ? Sexual activity: Yes  ?Other Topics Concern  ? Not on file  ?Social History Narrative  ? Not on file  ? ?Social Determinants of Health  ? ?Financial Resource Strain: Not on file  ?Food Insecurity: Not on file  ?Transportation Needs: Not on file  ?Physical Activity: Not on file  ?Stress: Not on file  ?Social Connections: Not on file  ?  ? ?Family History: ?The patient's family history includes Heart attack (age of onset: 45) in his father; Hypertension in his mother. ? ?ROS:   ?Please see the history of present illness. ?(+) Snoring ?All other systems are reviewed and negative.  ? ? ?EKGs/Labs/Other Studies Reviewed:   ? ?The following studies were reviewed today: ? ?Monitor 01/2022: ?Sinus rhythm with average heart rate of 76 bpm ?Brief episodes of atrial tachycardia, longest 17 beats with average heart rate of 125 beats minute. ?No atrial fibrillation, no pauses, no adverse arrhythmias. ?Rare PVCs and PACs. ?Overall reassuring monitor in the setting of recent syncope. ?  ?  ?Patch Wear Time:  13 days and 19 hours (2023-01-10T11:08:42-0500 to 2023-01-24T07:03:47-0500) ?  ?Patient had a min HR of 55 bpm, max HR of 222 bpm, and avg HR of 76 bpm. Predominant underlying rhythm was Sinus Rhythm. 16 Supraventricular Tachycardia runs occurred, the run with the fastest interval lasting 7 beats with a max rate of 222 bpm, the  ?longest lasting 17 beats with an avg rate of 125 bpm. Isolated SVEs were rare (<1.0%), SVE Couplets were rare (<1.0%), and SVE Triplets were rare (<1.0%). Isolated VEs were rare (<1.0%), VE Couplets were rare (<1.0%), and no VE Triplets were present.  ? ?ECHO [facility] 02/2021: ?Echo complete   ?Result Value Ref Range  ?LV Ejection Fraction (%) 55 %  ?Right Ventricle Systolic Pressure (mmHg) 16 mmHg  ?Left Atrium Diameter (cm) 3.7 cm  ?LV End Diastolic Diameter (cm) 4.4 cm  ?LV End Systolic Diameter (cm) 1.9 cm  ?LV Septum Wall Thickness (cm) 1.3 cm  ?LV Posterior Wall Thickness (cm) 0.9 cm  ?Tricuspid Valve Regurgitation Grade trivial  ?Tricuspid Valve Regurgitation Max Velocity (m/s) 1.8 m/s  ?Mitral Valve Regurgitation Grade none  ?Mitral Valve Stenosis Grade none  ?Aortic Valve Regurgitation Grade moderate  ?Aortic Valve Stenosis Grade none  ? ?EKG:  EKG is personally reviewed. ?03/23/2022: EKG was not ordered. ?12/29/2021: SR 65, borderline first-degree AV block  with PR interval of 200 ms. ? ?Recent Labs: ?11/13/2021: ALT 18; BUN 21; Creatinine, Ser 1.19; Hemoglobin 14.0; Platelets 137; Potassium 3.7; Sodium 137  ?Recent Lipid Panel ?   ?Component Value Date/Time  ? CHOL 157 01/15/2014 0803  ? TRIG 126.0 01/15/2014 0803  ? HDL 41.60 01/15/2014 0803  ? CHOLHDL 4 01/15/2014 0803  ? VLDL 25.2 01/15/2014 0803  ? [place] 90 01/15/2014 0803  ? LDLDIRECT 161.0 10/09/2013 1050  ? ? ? ?Risk Assessment/Calculations:   ? ? ?    ? ?   ? ?Physical Exam:   ? ?VS:  BP 136/64   Pulse (!) 8   Ht '5\' 8"'$  (1.727 m)   Wt 201 lb 12.8 oz (91.5 kg)   SpO2 96%   BMI 30.68 kg/m?    ? ?Wt Readings from Last 3 Encounters:  ?03/23/22 201 lb 12.8 oz (91.5 kg)  ?02/19/22 200 lb 3.2 oz (90.8 kg)  ?12/29/21 196 lb (88.9 kg)  ?  ? ?GEN:  Well nourished, well developed in no acute distress ?HEENT: Normal ?NECK: No JVD; No carotid bruits ?LYMPHATICS: No lymphadenopathy ?CARDIAC: RRR, faint diastolic murmur, no rubs, gallops ?RESPIRATORY:  Clear to auscultation without rales, wheezing or rhonchi  ?ABDOMEN: Soft, non-tender, non-distended ?MUSCULOSKELETAL:  No edema; No deformity  ?SKIN: Warm and dry ?NEUROLOGIC:  Alert and oriented x 3 ?PSYCHIATRIC:  Normal affect  ? ?ASSESSMENT:   ? ?1. Nonrheumatic aortic valve insufficiency   ?2.  Pure hypercholesterolemia   ?3. OSA (obstructive sleep apnea)   ? ? ?PLAN:   ? ?In order of problems listed above: ? ?Aortic regurgitation ?Moderate aortic valve regurgitation.  Currently asymptomatic.  We will check

## 2022-04-06 ENCOUNTER — Other Ambulatory Visit: Payer: Self-pay | Admitting: Urology

## 2022-05-20 NOTE — Progress Notes (Signed)
Anesthesia Review:  PCP: Cardiologist : Chest x-ray : 11/13/21  EKG :12/29/21  Echo : 06/2020  Stress test: Cardiac Cath :  Activity level:  Sleep Study/ CPAP : Fasting Blood Sugar :      / Checks Blood Sugar -- times a day:   Blood Thinner/ Instructions /Last Dose: ASA / Instructions/ Last Dose :

## 2022-05-21 NOTE — Progress Notes (Signed)
DUE TO COVID-19 ONLY ONE VISITOR IS ALLOWED TO COME WITH YOU AND STAY IN THE WAITING ROOM ONLY DURING PRE OP AND PROCEDURE DAY OF SURGERY.  2 VISITOR  MAY VISIT WITH YOU AFTER SURGERY IN YOUR PRIVATE ROOM DURING VISITING HOURS ONLY! YOU MAY HAVE ONE PERSON SPEND THE NITE WITH YOU IN YOUR ROOM AFTER SURGERY.      Your procedure is scheduled on:    06/04/2022   Report to [facility] Main  Entrance   Report to admitting at   0645             AM DO NOT BRING INSURANCE CARD, PICTURE ID OR WALLET DAY OF SURGERY.      Call this number if you have problems the morning of surgery [phone]    REMEMBER: NO  SOLID FOODS , CANDY, GUM OR MINTS AFTER [place] .       Mar[place] Kitchen CLEAR LIQUIDS UNTIL   0600am              DAY OF SURGERY.       CLEAR LIQUID DIET   Foods Allowed      WATER BLACK COFFEE ( SUGAR OK, NO MILK, CREAM OR CREAMER) REGULAR AND DECAF  TEA ( SUGAR OK NO MILK, CREAM, OR CREAMER) REGULAR AND DECAF  PLAIN JELLO ( NO RED)  FRUIT ICES ( NO RED, NO FRUIT PULP)  POPSICLES ( NO RED)  JUICE- APPLE, WHITE GRAPE AND WHITE CRANBERRY  SPORT DRINK LIKE GATORADE ( NO RED)  CLEAR BROTH ( VEGETABLE , CHICKEN OR BEEF)                                                                     BRUSH YOUR TEETH MORNING OF SURGERY AND RINSE YOUR MOUTH OUT, NO CHEWING GUM CANDY OR MINTS.     Take these medicines the morning of surgery with A SIP OF WATER:  none    DO NOT TAKE ANY DIABETIC MEDICATIONS DAY OF YOUR SURGERY                               You may not have any metal on your body including hair pins and              piercings  Do not wear jewelry, make-up, lotions, powders or perfumes, deodorant             Do not wear nail polish on your fingernails.              IF YOU ARE A MALE AND WANT TO SHAVE UNDER ARMS OR LEGS PRIOR TO SURGERY YOU MUST DO SO AT LEAST 48 HOURS PRIOR TO SURGERY.              Men may shave face and neck.   Do not bring valuables to the  hospital. [place].  Contacts, dentures or bridgework may not be worn into surgery.  Leave suitcase in the car. After surgery it may be brought to your room.     Patients discharged  the day of surgery will not be allowed to drive home. IF YOU ARE HAVING SURGERY AND GOING HOME THE SAME DAY, YOU MUST HAVE AN ADULT TO DRIVE YOU HOME AND BE WITH YOU FOR 24 HOURS. YOU MAY GO HOME BY TAXI OR UBER OR ORTHERWISE, BUT AN ADULT MUST ACCOMPANY YOU HOME AND STAY WITH YOU FOR 24 HOURS.                Please read over the following fact sheets you were given: _____________________________________________________________________  [facility] - Preparing for Surgery Before surgery, you can play an important role.  Because skin is not sterile, your skin needs to be as free of germs as possible.  You can reduce the number of germs on your skin by washing with CHG (chlorahexidine gluconate) soap before surgery.  CHG is an antiseptic cleaner which kills germs and bonds with the skin to continue killing germs even after washing. Please DO NOT use if you have an allergy to CHG or antibacterial soaps.  If your skin becomes reddened/irritated stop using the CHG and inform your nurse when you arrive at Short Stay. Do not shave (including legs and underarms) for at least 48 hours prior to the first CHG shower.  You may shave your face/neck. Please follow these instructions carefully:  1.  Shower with CHG Soap the night before surgery and the  morning of Surgery.  2.  If you choose to wash your hair, wash your hair first as usual with your  normal  shampoo.  3.  After you shampoo, rinse your hair and body thoroughly to remove the  shampoo.                           4.  Use CHG as you would any other liquid soap.  You can apply chg directly  to the skin and wash                       Gently with a scrungie or clean washcloth.  5.  Apply the CHG Soap to your body ONLY FROM  THE NECK DOWN.   Do not use on face/ open                           Wound or open sores. Avoid contact with eyes, ears mouth and genitals (private parts).                       Wash face,  Genitals (private parts) with your normal soap.             6.  Wash thoroughly, paying special attention to the area where your surgery  will be performed.  7.  Thoroughly rinse your body with warm water from the neck down.  8.  DO NOT shower/wash with your normal soap after using and rinsing off  the CHG Soap.                9.  Pat yourself dry with a clean towel.            10.  Wear clean pajamas.            11.  Place clean sheets on your bed the night of your first shower and do not  sleep with pets. Day of Surgery : Do not apply any lotions/deodorants the morning of  surgery.  Please wear clean clothes to the hospital/surgery center.  FAILURE TO FOLLOW THESE INSTRUCTIONS MAY RESULT IN THE CANCELLATION OF YOUR SURGERY PATIENT SIGNATURE_________________________________  NURSE SIGNATURE__________________________________  ________________________________________________________________________

## 2022-05-24 ENCOUNTER — Other Ambulatory Visit: Payer: Self-pay | Admitting: Family Medicine

## 2022-05-24 ENCOUNTER — Encounter (HOSPITAL_COMMUNITY)
Admission: RE | Admit: 2022-05-24 | Discharge: 2022-05-24 | Disposition: A | Discharge status: Home / Self Care | Payer: Medicare Other | Source: Ambulatory Visit | Attending: Anesthesiology | Admitting: Anesthesiology

## 2022-05-24 ENCOUNTER — Ambulatory Visit
Admission: RE | Admit: 2022-05-24 | Discharge: 2022-05-24 | Disposition: A | Discharge status: Home / Self Care | Payer: Medicare Other | Source: Ambulatory Visit | Attending: Family Medicine | Admitting: Family Medicine

## 2022-05-24 DIAGNOSIS — R051 Acute cough: Secondary | ICD-10-CM

## 2022-05-24 DIAGNOSIS — Z01818 Encounter for other preprocedural examination: Secondary | ICD-10-CM

## 2022-05-25 NOTE — Progress Notes (Addendum)
COVID Vaccine Completed: Yes  Date of COVID positive in last 90 days:  No  PCP - C. Melinda Crutch, MD Cardiologist - Candee Furbish, MD Pulmonologist - Leslye Peer, MD  Chest x-ray - 05-24-22 Epic EKG - 12-29-21 Epic Stress Test - greater than 2 years ago in [place] ECHO - 03-02-21 [facility] Cardiac Cath - N/A Pacemaker/ICD device last checked: Spinal Cord Stimulator: Long Term Monitor - 2023 Epic  Bowel Prep - N/A  Sleep Study - Yes, + sleep apnea CPAP - No  Fasting Blood Sugar - N/A Checks Blood Sugar _____ times a day  Blood Thinner Instructions:  N/A Aspirin Instructions: Last Dose:  Activity level:   Can go up a flight of stairs and perform activities of daily living without stopping and without symptoms of chest pain or shortness of breath.  Anesthesia review:  Aortic valve insufficiency, endocarditis, Aortic regurg, OSA, murmur, hx of syncope.   Recent respiratory illness.  Patient has some residual cough but is improving.  Only noticed patient coughing once during PAT visit.  Patient denies shortness of breath, fever, and chest pain at PAT appointment  Patient verbalized understanding of instructions that were given to them at the PAT appointment. Patient was also instructed that they will need to review over the PAT instructions again at home before surgery.

## 2022-05-25 NOTE — Patient Instructions (Signed)
DUE TO SPACE LIMITATIONS, ONLY TWO VISITORS  (aged 76 and older) ARE ALLOWED TO COME WITH YOU AND STAY IN THE WAITING ROOM DURING YOUR PRE OP AND PROCEDURE.   **NO VISITORS ARE ALLOWED IN THE SHORT STAY AREA OR RECOVERY ROOM!!**  IF YOU WILL BE ADMITTED INTO THE HOSPITAL YOU ARE ALLOWED ONLY FOUR SUPPORT PEOPLE DURING VISITATION HOURS (7 AM -8PM)   The support person(s) must pass our screening, and use Hand sanitizing gel. Visitors GUEST BADGE MUST BE WORN VISIBLY  One adult visitor may remain with you overnight and MUST be in the room by 8 P.M.   You are not required to quarantine at this time prior to your surgery. However, you must do this: Hand Hygiene often Do NOT share personal items Notify your provider if you are in close contact with someone who has COVID or you develop fever 100.4 or greater, new onset of sneezing, cough, sore throat, shortness of breath or body aches.       Your procedure is scheduled on:  June 04, 2022  Report to [facility] Main Entrance, (Please feel free to use our VALET Service at the [employer])   Report to admitting at:  6:45 AM  Call this number if you have any questions or problems the morning of surgery [phone]  Do not eat food :After Midnight the night prior to your surgery/procedure.  After Midnight you may have the following liquids until 6:00 AM DAY OF SURGERY  Clear Liquid Diet Water Black Coffee (sugar ok, NO MILK/CREAM OR CREAMERS)  Tea (sugar ok, NO MILK/CREAM OR CREAMERS) regular and decaf                             Plain Jell-O (NO RED)                                           Fruit ices (not with fruit pulp, NO RED)                                     Popsicles (NO RED)                                                                  Juice: apple, WHITE grape, WHITE cranberry Sports drinks like Gatorade (NO RED) Clear broth(vegetable,chicken,beef)            If you have questions, please contact your surgeon's  office.   FOLLOW ADDITIONAL PRE OP INSTRUCTIONS YOU RECEIVED FROM YOUR SURGEON'S OFFICE!!!   Oral Hygiene is also important to reduce your risk of infection.        Remember - BRUSH YOUR TEETH THE MORNING OF SURGERY WITH YOUR REGULAR TOOTHPASTE  Do NOT smoke after Midnight the night before surgery.  Take ONLY these medicines the morning of surgery with A SIP OF WATER: Rosuvastatin, Flonase spray                   You may not have any metal on your body  including jewelry, and body piercing  Do not wear lotions, powders, cologne, or deodorant  Men may shave face and neck.  Contacts, Hearing Aids, dentures or bridgework may not be worn into surgery.   You may bring a small overnight bag with you on the day of surgery   .[place] IS NOT RESPONSIBLE   FOR VALUABLES THAT ARE LOST OR STOLEN.    Special Instructions: Bring a copy of your healthcare power of attorney and living will documents the day of surgery, if you have not had them scanned in before   Please read over the following fact sheets you were given: IF YOU HAVE QUESTIONS ABOUT YOUR PRE-OP INSTRUCTIONS, PLEASE CALL [place] - Preparing for Surgery Before surgery, you can play an important role.  Because skin is not sterile, your skin needs to be as free of germs as possible.  You can reduce the number of germs on your skin by washing with CHG (chlorahexidine gluconate) soap before surgery.  CHG is an antiseptic cleaner which kills germs and bonds with the skin to continue killing germs even after washing. Please DO NOT use if you have an allergy to CHG or antibacterial soaps.  If your skin becomes reddened/irritated stop using the CHG and inform your nurse when you arrive at Short Stay. Do not shave (including legs and underarms) for at least 48 hours prior to the first CHG shower.  You may shave your face/neck.  Please follow these instructions carefully:  1.  Shower with CHG Soap the night before  surgery and the  morning of surgery.  2.  If you choose to wash your hair, wash your hair first as usual with your normal  shampoo.  3.  After you shampoo, rinse your hair and body thoroughly to remove the shampoo.                             4.  Use CHG as you would any other liquid soap.  You can apply chg directly to the skin and wash.  Gently with a scrungie or clean washcloth.  5.  Apply the CHG Soap to your body ONLY FROM THE NECK DOWN.   Do   not use on face/ open                           Wound or open sores. Avoid contact with eyes, ears mouth and   genitals (private parts).                       Wash face,  Genitals (private parts) with your normal soap.             6.  Wash thoroughly, paying special attention to the area where your    surgery  will be performed.  7.  Thoroughly rinse your body with warm water from the neck down.  8.  DO NOT shower/wash with your normal soap after using and rinsing off the CHG Soap.            9.  Pat yourself dry with a clean towel.            10.  Wear clean pajamas.            11.  Place clean sheets on your bed the night of your first shower and do not  sleep with pets.  ON THE DAY OF SURGERY : Do not apply any lotions/deodorants the morning of surgery.  Please wear clean clothes to the hospital/surgery center.    FAILURE TO FOLLOW THESE INSTRUCTIONS MAY RESULT IN THE CANCELLATION OF YOUR SURGERY  PATIENT SIGNATURE_________________________________  NURSE SIGNATURE__________________________________  ________________________________________________________________________

## 2022-05-26 ENCOUNTER — Other Ambulatory Visit: Payer: Self-pay

## 2022-05-26 ENCOUNTER — Encounter (HOSPITAL_COMMUNITY)
Admission: RE | Admit: 2022-05-26 | Discharge: 2022-05-26 | Disposition: A | Discharge status: Home / Self Care | Payer: Medicare Other | Source: Ambulatory Visit | Attending: Urology | Admitting: Urology

## 2022-05-26 ENCOUNTER — Encounter (HOSPITAL_COMMUNITY): Payer: Self-pay

## 2022-05-26 VITALS — BP 116/51 | HR 74 | Temp 98.2°F | Resp 16 | Ht 68.0 in | Wt 195.0 lb

## 2022-05-26 DIAGNOSIS — N400 Enlarged prostate without lower urinary tract symptoms: Secondary | ICD-10-CM | POA: Diagnosis not present

## 2022-05-26 DIAGNOSIS — Z01812 Encounter for preprocedural laboratory examination: Secondary | ICD-10-CM | POA: Diagnosis present

## 2022-05-26 DIAGNOSIS — G4733 Obstructive sleep apnea (adult) (pediatric): Secondary | ICD-10-CM | POA: Insufficient documentation

## 2022-05-26 DIAGNOSIS — Z01818 Encounter for other preprocedural examination: Secondary | ICD-10-CM

## 2022-05-26 DIAGNOSIS — I351 Nonrheumatic aortic (valve) insufficiency: Secondary | ICD-10-CM | POA: Insufficient documentation

## 2022-05-26 DIAGNOSIS — I2510 Atherosclerotic heart disease of native coronary artery without angina pectoris: Secondary | ICD-10-CM

## 2022-05-26 DIAGNOSIS — I10 Essential (primary) hypertension: Secondary | ICD-10-CM | POA: Diagnosis not present

## 2022-05-26 LAB — CBC
HCT: 40.5 % (ref 39.0–52.0)
Hemoglobin: 13.6 g/dL (ref 13.0–17.0)
MCH: 30.2 pg (ref 26.0–34.0)
MCHC: 33.6 g/dL (ref 30.0–36.0)
MCV: 89.8 fL (ref 80.0–100.0)
Platelets: 198 10*3/uL (ref 150–400)
RBC: 4.51 MIL/uL (ref 4.22–5.81)
RDW: 13.2 % (ref 11.5–15.5)
WBC: 9.1 10*3/uL (ref 4.0–10.5)
nRBC: 0 % (ref 0.0–0.2)

## 2022-05-26 LAB — BASIC METABOLIC PANEL
Anion gap: 9 (ref 5–15)
BUN: 23 mg/dL (ref 8–23)
CO2: 27 mmol/L (ref 22–32)
Calcium: 8.6 mg/dL — ABNORMAL LOW (ref 8.9–10.3)
Chloride: 102 mmol/L (ref 98–111)
Creatinine, Ser: 1.4 mg/dL — ABNORMAL HIGH (ref 0.61–1.24)
GFR, Estimated: 52 mL/min — ABNORMAL LOW (ref >60)
Glucose, Bld: 149 mg/dL — ABNORMAL HIGH (ref 70–99)
Potassium: 3.5 mmol/L (ref 3.5–5.1)
Sodium: 138 mmol/L (ref 135–145)

## 2022-05-27 ENCOUNTER — Encounter (HOSPITAL_COMMUNITY): Payer: Self-pay | Admitting: Emergency Medicine

## 2022-05-27 NOTE — Progress Notes (Signed)
Anesthesia Chart Review:   Case: [id number] Date/Time: 06/04/22 0845   Procedure: Holmium Laser Enucleation of the Prostate with Morcellation - INTUBATION   Anesthesia type: General   Pre-op diagnosis: BENIGN PROSTATIC HYPERPLASIA   Location: [facility]   Surgeons: Janith Lima, MD       DISCUSSION: Pt is 76 years old with hx: HTN, aortic regurgitation (moderate by 2022 echo), endocarditis (2009 in [place] and 2014 here), syncope (January 2023 - thought cough related syncope in the setting of vigorous cough/Valsalva type maneuver and likely concomitant dehydration; cardiac event monitor reassuring), OSA  VS: BP (!) 116/51   Pulse 74   Temp 36.8 C (Oral)   Resp 16   Ht '5\' 8"'$  (1.727 m)   Wt 88.5 kg   SpO2 97%   BMI 29.65 kg/m   PROVIDERS: - PCP is Lawerance Cruel, MD - Cardiologist is Candee Furbish, MD. Last office visit 03/23/22.  - Pulmonologist is Leslye Peer, MD who sees pt for chronic cough. Last office visit 02/19/22  LABS: Labs reviewed: Acceptable for surgery. (all labs ordered are listed, but only abnormal results are displayed)  Labs Reviewed  BASIC METABOLIC PANEL - Abnormal; Notable for the following components:      Result Value   Glucose, Bld 149 (*)    Creatinine, Ser 1.40 (*)    Calcium 8.6 (*)    GFR, Estimated 52 (*)    All other components within normal limits  CBC     IMAGES: CXR 05/24/22:  - No acute cardiopulmonary disease process.  EKG 12/29/21: SR 65, borderline first-degree AV block with PR interval of 200 ms.   CV: Cardiac event monitor 01/18/22:   Sinus rhythm with average heart rate of 76 bpm  Brief episodes of atrial tachycardia, longest 17 beats with average heart rate of 125 beats minute.  No atrial fibrillation, no pauses, no adverse arrhythmias.  Rare PVCs and PACs.  Overall reassuring monitor in the setting of recent syncope  Echo 03/02/21 (care everywhere): - Normal LV systolic function with mild LVH -  Normal LA pressures with normal diastolic function - Normal RV systolic function - Valvular regurgitation: moderate AR, moderate PR, trivial TR - No valvular stenosis   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 (seen on post tx echo as well)    Enlarged prostate    Heart murmur    "think I have one now" (08/16/2013)   High cholesterol    Hypertension    Pneumonia    "almost once/yr" (08/16/2013)   Sleep apnea    "don't wear mask" (08/16/2013)    Past Surgical History:  Procedure Laterality Date   INGUINAL HERNIA REPAIR Right ~ 1950   TEE WITHOUT CARDIOVERSION N/A 08/17/2013   Procedure: TRANSESOPHAGEAL ECHOCARDIOGRAM (TEE);  Surgeon: Candee Furbish, MD;  Location: [facility] ENDOSCOPY;  Service: Cardiovascular;  Laterality: N/A;   TONSILLECTOMY  ~ 1497   UMBILICAL HERNIA REPAIR  ~ 2011    MEDICATIONS:  fluticasone (FLONASE) 50 MCG/ACT nasal spray   rosuvastatin (CRESTOR) 10 MG tablet   valsartan-hydrochlorothiazide (DIOVAN-HCT) 320-12.5 MG tablet   No current facility-administered medications for this encounter.    If no changes, I anticipate pt can proceed with surgery as scheduled.   Willeen Cass, PhD, FNP-BC [facility]/Anesthesiology  Phone: [phone] 05/27/2022 10:44 AM

## 2022-05-27 NOTE — Anesthesia Preprocedure Evaluation (Signed)
Anesthesia Evaluation  Patient identified by MRN, date of birth, ID band Patient awake    Reviewed: Allergy & Precautions, NPO status , Patient's Chart, lab work & pertinent test results  History of Anesthesia Complications (+) PONV and history of anesthetic complications  Airway Mallampati: II  TM Distance: >3 FB Neck ROM: Full    Dental no notable dental hx.    Pulmonary neg pulmonary ROS,    Pulmonary exam normal        Cardiovascular hypertension, Pt. on medications and Pt. on home beta blockers  Rhythm:Regular Rate:Normal     Neuro/Psych negative neurological ROS  negative psych ROS   GI/Hepatic Neg liver ROS, GERD  ,  Endo/Other  Hypothyroidism   Renal/GU negative Renal ROS  negative genitourinary   Musculoskeletal  (+) Arthritis , Fibromyalgia -  Abdominal Normal abdominal exam  (+)   Peds  Hematology negative hematology ROS (+)   Anesthesia Other Findings   Reproductive/Obstetrics                            Anesthesia Physical Anesthesia Plan  ASA: 2  Anesthesia Plan: General   Post-op Pain Management:    Induction: Intravenous  PONV Risk Score and Plan: 4 or greater and Ondansetron, Dexamethasone, Treatment may vary due to age or medical condition and Amisulpride  Airway Management Planned: Mask and Oral ETT  Additional Equipment: None  Intra-op Plan:   Post-operative Plan: Extubation in OR  Informed Consent: I have reviewed the patients History and Physical, chart, labs and discussed the procedure including the risks, benefits and alternatives for the proposed anesthesia with the patient or authorized representative who has indicated his/her understanding and acceptance.     Dental advisory given  Plan Discussed with: CRNA  Anesthesia Plan Comments: (See APP note by A. Lysandra Loughmiller, FNP   Lab Results      Component                Value               Date                       WBC                      7.9                 06/30/2022                HGB                      13.6                06/30/2022                HCT                      39.3                06/30/2022                MCV                      87.9                06/30/2022                  PLT                      373                 06/30/2022           Lab Results      Component                Value               Date                      NA                       138                 06/30/2022                K                        3.8                 06/30/2022                CO2                      27                  06/30/2022                GLUCOSE                  96                  06/30/2022                BUN                      8                   06/30/2022                CREATININE               0.89                06/30/2022                CALCIUM                  9.6                 06/30/2022                EGFR                     70 (L)              12/24/2016                GFRNONAA                 >60                 06/30/2022          )       Anesthesia Quick Evaluation  

## 2022-06-03 ENCOUNTER — Other Ambulatory Visit: Payer: Self-pay | Admitting: Urology

## 2022-06-03 ENCOUNTER — Ambulatory Visit (INDEPENDENT_AMBULATORY_CARE_PROVIDER_SITE_OTHER): Payer: Medicare Other | Admitting: Urology

## 2022-06-03 ENCOUNTER — Encounter: Payer: Self-pay | Admitting: Urology

## 2022-06-03 VITALS — BP 117/68 | HR 89 | Ht 68.0 in | Wt 205.0 lb

## 2022-06-03 DIAGNOSIS — N401 Enlarged prostate with lower urinary tract symptoms: Secondary | ICD-10-CM

## 2022-06-03 DIAGNOSIS — N138 Other obstructive and reflux uropathy: Secondary | ICD-10-CM

## 2022-06-03 DIAGNOSIS — R972 Elevated prostate specific antigen [PSA]: Secondary | ICD-10-CM | POA: Diagnosis not present

## 2022-06-03 DIAGNOSIS — N400 Enlarged prostate without lower urinary tract symptoms: Secondary | ICD-10-CM

## 2022-06-03 LAB — BLADDER SCAN AMB NON-IMAGING

## 2022-06-03 NOTE — Patient Instructions (Signed)

## 2022-06-03 NOTE — Progress Notes (Unsigned)
Surgical Physician Order Form [facility] [place]  * Scheduling expectation :  4 to 6 weeks(recent pneumonia)  *Length of Case: 2 hours  *Clearance needed: no  *Anticoagulation Instructions: Hold all anticoagulants  *Aspirin Instructions: Hold  *Post-op visit Date/Instructions:  1-3 day cath removal  *Diagnosis: BPH w/urinary obstruction  *Procedure:     HOLEP ([id number])   Additional orders: N/A  -Admit type: OUTpatient  -Anesthesia: General  -VTE Prophylaxis Standing Order SCD's       Other:   -Standing Lab Orders Per Anesthesia    Lab other: UA&Urine Culture 2 weeks prior  -Standing Test orders EKG/Chest x-ray per Anesthesia       Test other:   - Medications:  Ancef 2gm IV  -Other orders:  N/A

## 2022-06-03 NOTE — Progress Notes (Signed)
06/03/22 3:52 PM   Eric Chambers 13-Apr-1946 [id number]  CC: BPH, elevated PSA  HPI: I saw Mr. Brann today for evaluation of BPH and elevated PSA.  He had been followed in [place], and has undergone extensive work-up.  To briefly summarize, he had PSA around 20 and has had multiple negative biopsies x3, most recently in February 2023.  He also had a recent prostate MRI in February 2023 that showed no evidence of prostate cancer, and prostate was significantly enlarged at 130 g.  His primary urinary symptoms are weak and slow stream as well as some urgency and mild leakage.  He previously was on alpha blockers with bothersome side effects, and is not currently on any prostate medications.  He currently is recovering from pneumonia.  PVR today is normal at 5 mL.   PMH: 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 (seen on post tx echo as well)    Enlarged prostate    Heart murmur    "think I have one now" (08/16/2013)   High cholesterol    Hypertension    Pneumonia    "almost once/yr" (08/16/2013)   Sleep apnea    "don't wear mask" (08/16/2013)    Surgical History: Past Surgical History:  Procedure Laterality Date   INGUINAL HERNIA REPAIR Right ~ 1950   TEE WITHOUT CARDIOVERSION N/A 08/17/2013   Procedure: TRANSESOPHAGEAL ECHOCARDIOGRAM (TEE);  Surgeon: Candee Furbish, MD;  Location: [facility] ENDOSCOPY;  Service: Cardiovascular;  Laterality: N/A;   TONSILLECTOMY  ~ 9562   UMBILICAL HERNIA REPAIR  ~ 2011    Family History: Family History  Problem Relation Age of Onset   Hypertension Mother    Heart attack Father 17    Social History:  reports that he has never smoked. He has never been exposed to tobacco smoke. He has never used smokeless tobacco. He reports current alcohol use of about 2.0 standard drinks of alcohol per week. He  reports that he does not use drugs.  Physical Exam: BP 117/68   Pulse 89   Ht '5\' 8"'$  (1.727 m)   Wt 205 lb (93 kg)   BMI 31.17 kg/m    Constitutional:  Alert and oriented, No acute distress. Cardiovascular: No clubbing, cyanosis, or edema. Respiratory: Normal respiratory effort, no increased work of breathing. GI: Abdomen is soft, nontender, nondistended, no abdominal masses   Laboratory Data: Reviewed, see HPI  Pertinent Imaging: I have personally viewed and interpreted the prostate MRI showing 130 g prostate with no evidence of prostate cancer.  Assessment & Plan:   76 year old male with BPH and obstructive urinary symptoms, prostate measures 130 g, PSA has been as high as 20, but 3 prior negative biopsies including in February 2023, and recent prostate MRI with no suspicious lesions.  He has bothersome side effects from alpha blockers and would like to avoid additional medications.  We discussed the risks and benefits of HoLEP at length.  The procedure requires general anesthesia and takes 1 to 2 hours, and a holmium laser is used to enucleate the prostate and push this tissue into the bladder.  A morcellator is then used to remove this tissue, which is sent for pathology.  The vast majority(>95%) of patients are able to discharge the same day with a catheter in place for 2 to 3 days, and  will follow-up in clinic for a voiding trial.  We specifically discussed the risks of bleeding, infection, retrograde ejaculation, temporary urgency and urge incontinence, very low risk of long-term incontinence, urethral stricture/bladder neck contracture, pathologic evaluation of prostate tissue and possible detection of prostate cancer or other malignancy, and possible need for additional procedures.  Schedule HOLEP, will need to wait 3 to 4 weeks with recent possible pneumonia  Nickolas Madrid, MD 06/03/2022  [facility] [address], [place], [place]  [address] [phone]

## 2022-06-04 ENCOUNTER — Encounter (HOSPITAL_COMMUNITY): Admission: RE | Payer: Self-pay | Source: Home / Self Care

## 2022-06-04 ENCOUNTER — Ambulatory Visit (HOSPITAL_COMMUNITY): Admission: RE | Admit: 2022-06-04 | Payer: Medicare Other | Source: Home / Self Care | Admitting: Urology

## 2022-06-04 DIAGNOSIS — Z01818 Encounter for other preprocedural examination: Secondary | ICD-10-CM

## 2022-06-04 DIAGNOSIS — I2510 Atherosclerotic heart disease of native coronary artery without angina pectoris: Secondary | ICD-10-CM

## 2022-06-04 SURGERY — Holmium Laser Enucleation of the Prostate with Morcellation
Anesthesia: General

## 2022-06-07 NOTE — Progress Notes (Unsigned)
Synopsis: Referred for chronic cough by Lawerance Cruel, MD  Subjective:   PATIENT ID: Eric Chambers GENDER: male DOB: November 29, 1946, MRN: [id number]  No chief complaint on file.  76yM with AR, endocarditis, HTN, frequent pneumonia, OSA, GERD referred for cough - saw Dr. 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 Says he just has a hard time clearing chest colds when he gets them. Has had suspected cough-related syncope.  If he takes a double dose of tessalon perles it knocks out cough.   He has been on singulair for about a month. He's unsure if it's been helpful for his cough but his cough did improve over the course of the month that he's been on it.  He was having quite a bit of sinus congestion and postnasal drainage prior to starting   Interval HPI: Maintained on singulair, flonase/nasal irrigation started  Otherwise pertinent review of systems is negative.  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 (seen on post tx echo as well)    Enlarged prostate    Heart murmur    "think I have one now" (08/16/2013)   High cholesterol    Hypertension    Pneumonia    "almost once/yr" (08/16/2013)   Sleep apnea    "don't wear mask" (08/16/2013)     Family History  Problem Relation Age of Onset   Hypertension Mother    Heart attack Father 56     Past Surgical History:  Procedure Laterality Date   INGUINAL HERNIA REPAIR Right ~ 1950   TEE WITHOUT CARDIOVERSION N/A 08/17/2013   Procedure: TRANSESOPHAGEAL ECHOCARDIOGRAM (TEE);  Surgeon: Candee Furbish, MD;  Location: [facility] ENDOSCOPY;  Service: Cardiovascular;  Laterality: N/A;   TONSILLECTOMY  ~ 9024   UMBILICAL HERNIA REPAIR  ~ 2011    Social History   Socioeconomic History   Marital status: Married    Spouse name: Not on file   Number of children: Not on file    Years of education: Not on file   Highest education level: Not on file  Occupational History   Not on file  Tobacco Use   Smoking status: Never    Passive exposure: Never   Smokeless tobacco: Never  Vaping Use   Vaping Use: Never used  Substance and Sexual Activity   Alcohol use: Yes    Alcohol/week: 2.0 standard drinks of alcohol    Types: 2 Glasses of wine per week   Drug use: No   Sexual activity: Yes  Other Topics Concern   Not on file  Social History Narrative   Not on file   Social Determinants of Health   Financial Resource Strain: Not on file  Food Insecurity: Not on file  Transportation Needs: Not on file  Physical Activity: Not on file  Stress: Not on file  Social Connections: Not on file  Intimate Partner Violence: Not on file     Allergies  Allergen Reactions   Finasteride     Other reaction(s): felt bad, Other (See Comments)     Outpatient Medications Prior to Visit  Medication Sig Dispense Refill   albuterol (VENTOLIN HFA) 108 (90 Base) MCG/ACT inhaler Inhale into the lungs.     HYDROcodone bit-homatropine (HYCODAN) 5-1.5 MG/5ML syrup Take 5 mLs by mouth every 6 (  six) hours as needed.     rosuvastatin (CRESTOR) 10 MG tablet Take 10 mg by mouth daily.     silodosin (RAPAFLO) 8 MG CAPS capsule Take by mouth.     sodium chloride (OCEAN) 0.65 % nasal spray Place into the nose.     valsartan-hydrochlorothiazide (DIOVAN-HCT) 320-12.5 MG tablet Take 1 tablet by mouth daily.     No facility-administered medications prior to visit.       Objective:   Physical Exam:  General appearance: 76 y.o., male, NAD, conversant  Eyes: anicteric sclerae; PERRL, tracking appropriately HENT: NCAT; MMM +nasal polyps left nare Neck: Trachea midline; no lymphadenopathy, no JVD Lungs: CTAB, no crackles, no wheeze, with normal respiratory effort CV: RRR, no murmur  Abdomen: Soft, non-tender; non-distended, BS present  Extremities: No peripheral edema, warm Skin:  Normal turgor and texture; no rash Psych: Appropriate affect Neuro: Alert and oriented to person and place, no focal deficit     There were no vitals filed for this visit.    on RA BMI Readings from Last 3 Encounters:  06/03/22 31.17 kg/m  05/26/22 29.65 kg/m  03/23/22 30.68 kg/m   Wt Readings from Last 3 Encounters:  06/03/22 205 lb (93 kg)  05/26/22 195 lb (88.5 kg)  03/23/22 201 lb 12.8 oz (91.5 kg)     CBC    Component Value Date/Time   WBC 9.1 05/26/2022 1427   RBC 4.51 05/26/2022 1427   HGB 13.6 05/26/2022 1427   HCT 40.5 05/26/2022 1427   PLT 198 05/26/2022 1427   MCV 89.8 05/26/2022 1427   MCH 30.2 05/26/2022 1427   MCHC 33.6 05/26/2022 1427   RDW 13.2 05/26/2022 1427   LYMPHSABS 1.5 11/13/2021 1814   MONOABS 0.3 11/13/2021 1814   EOSABS 0.1 11/13/2021 1814   BASOSABS 0.0 11/13/2021 1814    Eos 0-100 IgE 11 in 2019 with negative allergy panel  Chest Imaging: CXR 11/13/21 reviewed by me unremarkable  CXR 05/24/22 reviewed by me unremarkable  Pulmonary Functions Testing Results:     No data to display             Assessment & Plan:   # Chronic cough: # CRS with nasal polyps left nare: # Postnasal drainage Has responded well to addition of singulair and it's actually well suited to his having nasal polyps.  Plan: - nasal irrigation  - flonase 1 spray each nare after nasal irrigation - add singulair 10 mg daily - continue this regimen whenever cough/postnasal drainage active for at least 8 weeks or until cough improvement, whichever is longer - may need to revisit gerd mgmt if cough active next visit, consider PFT next visit particularly if cough active again - tessalon perles as needed    RTC 6 months   Maryjane Hurter, MD [facility] 06/07/2022 5:54 PM

## 2022-06-08 ENCOUNTER — Ambulatory Visit (INDEPENDENT_AMBULATORY_CARE_PROVIDER_SITE_OTHER): Payer: Medicare Other

## 2022-06-08 ENCOUNTER — Telehealth: Payer: Self-pay

## 2022-06-08 ENCOUNTER — Encounter: Payer: Self-pay | Admitting: Student

## 2022-06-08 ENCOUNTER — Ambulatory Visit (INDEPENDENT_AMBULATORY_CARE_PROVIDER_SITE_OTHER): Payer: Medicare Other | Admitting: Student

## 2022-06-08 VITALS — BP 122/60 | HR 71 | Temp 97.8°F | Ht 68.0 in | Wt 197.0 lb

## 2022-06-08 DIAGNOSIS — R052 Subacute cough: Secondary | ICD-10-CM | POA: Diagnosis not present

## 2022-06-08 DIAGNOSIS — R053 Chronic cough: Secondary | ICD-10-CM | POA: Diagnosis not present

## 2022-06-08 NOTE — Progress Notes (Signed)
[facility] Posting Form   Surgery Date/Time: Date: 07/30/2022  Surgeon: Dr. Nickolas Madrid, MD  Surgery Location: Day Surgery  Inpt ( No  )   Outpt (Yes)   Obs ( No  )   Diagnosis: N40.1, N13.8 Benign Prostatic Hyperplasia with Urinary Obstruction  -CPT: 88677  Surgery: Holmium Laser Enucleation of the Prostate   Stop Anticoagulations: Yes and hold ASA  Cardiac/Medical/Pulmonary Clearance needed: no  *Orders entered into EPIC  Date: 06/08/22   *Case booked in EPIC  Date: 06/08/22  *Notified pt of Surgery: Date: 06/08/22  PRE-OP UA & CX: yes, will obtain on 07/16/2022 in office  *Placed into Prior Authorization Work Fabio Bering Date: 06/08/22   Assistant/laser/rep:No

## 2022-06-08 NOTE — Patient Instructions (Addendum)
-   x ray today - PFTs next visit if you're still feeling a bit under the weather. If you're feeling 100% better by next visit you can cancel PFTs.  - take a shower, clear your nose out of crusting/drainage or use neti pot then use flonase 1 spray each nostril and restart singulair. Need to continue this regimen for at least 8 weeks to kick your cough and postnasal drainage completely. Could also see ENT down the road if we have trouble or consider PFTs (breathing tests).

## 2022-06-08 NOTE — Telephone Encounter (Signed)
I spoke with Mr. Catala. We have discussed possible surgery dates and Friday August 11th, 2023 was agreed upon by all parties. Patient given information about surgery date, what to expect pre-operatively and post operatively.  We discussed that a Pre-Admission Testing office will be calling to set up the pre-op visit that will take place prior to surgery, and that these appointments are typically done over the phone with a Pre-Admissions RN.  Informed patient that our office will communicate any additional care to be provided after surgery. Patients questions or concerns were discussed during our call. Advised to call our office should there be any additional information, questions or concerns that arise. Patient verbalized understanding.

## 2022-06-23 ENCOUNTER — Ambulatory Visit: Payer: Self-pay | Admitting: Urology

## 2022-07-05 HISTORY — PX: CATARACT EXTRACTION W/ INTRAOCULAR LENS IMPLANT: SHX1309

## 2022-07-16 ENCOUNTER — Other Ambulatory Visit: Payer: Medicare Other

## 2022-07-19 HISTORY — PX: CATARACT EXTRACTION W/ INTRAOCULAR LENS IMPLANT: SHX1309

## 2022-07-20 ENCOUNTER — Telehealth: Payer: Self-pay | Admitting: Cardiology

## 2022-07-20 ENCOUNTER — Encounter
Admission: RE | Admit: 2022-07-20 | Discharge: 2022-07-20 | Disposition: A | Discharge status: Home / Self Care | Payer: Medicare Other | Source: Ambulatory Visit | Attending: Urology | Admitting: Urology

## 2022-07-20 VITALS — Ht 68.0 in | Wt 200.0 lb

## 2022-07-20 DIAGNOSIS — I351 Nonrheumatic aortic (valve) insufficiency: Secondary | ICD-10-CM

## 2022-07-20 DIAGNOSIS — I10 Essential (primary) hypertension: Secondary | ICD-10-CM

## 2022-07-20 DIAGNOSIS — E876 Hypokalemia: Secondary | ICD-10-CM

## 2022-07-20 NOTE — Patient Instructions (Addendum)
Your procedure is scheduled on: Friday July 30, 2022. Report to Day Surgery inside [place] 2nd floor, stop by admissions desk before getting on elevator.  To find out your arrival time please call [phone] between 1PM - 3PM on Thursday July 29, 2022.  Remember: Instructions that are not followed completely may result in serious medical risk,  up to and including death, or upon the discretion of your surgeon and anesthesiologist your  surgery may need to be rescheduled.     _X__ 1. Do not eat food or drink fluids after midnight the night before your procedure.                 No chewing gum or hard candies.   __X__2.  On the morning of surgery brush your teeth with toothpaste and water, you                may rinse your mouth with mouthwash if you wish.  Do not swallow any toothpaste or mouthwash.     _X__ 3.  No Alcohol for 24 hours before or after surgery.   _X__ 4.  Do Not Smoke or use e-cigarettes For 24 Hours Prior to Your Surgery.                 Do not use any chewable tobacco products for at least 6 hours prior to                 Surgery.  _X__  5.  Do not use any recreational drugs (marijuana, cocaine, heroin, ecstasy, MDMA or other)                For at least one week prior to your surgery.  Combination of these drugs with anesthesia                May have life threatening results.  ____  6.  Bring all medications with you on the day of surgery if instructed.   __X_ 7.  Notify your doctor if there is any change in your medical condition      (cold, fever, infections).     Do not wear jewelry, make-up, hairpins, clips or nail polish. Do not wear lotions, powders, or perfumes. You may wear deodorant. Do not shave 48 hours prior to surgery. Men may shave face and neck. Do not bring valuables to the hospital.    [facility] is not responsible for any belongings or valuables.  Contacts, dentures or bridgework may not be worn into  surgery. Leave your suitcase in the car. After surgery it may be brought to your room. For patients admitted to the hospital, discharge time is determined by your treatment team.   Patients discharged the day of surgery will not be allowed to drive home.   Make arrangements for someone to be with you for the first 24 hours of your Same Day Discharge.   __X__ Take these medicines the morning of surgery with A SIP OF WATER:    1. None   2.   3.   4.  5.  6.  ____ Fleet Enema (as directed)   ____ Use CHG Soap (or wipes) as directed  ____ Use Benzoyl Peroxide Gel as instructed  ____ Use inhalers on the day of surgery  ____ Stop metformin 2 days prior to surgery    ____ Take 1/2 of usual insulin dose the night before surgery. No insulin the morning  of surgery.   ____ Call your PCP, cardiologist, or Pulmonologist if taking Coumadin/Plavix/aspirin and ask when to stop before your surgery.   __X__ One Week prior to surgery- Stop Anti-inflammatories such as Ibuprofen, Aleve, Advil, Motrin, meloxicam (MOBIC), diclofenac, etodolac, ketorolac, Toradol, Daypro, piroxicam, Goody's or BC powders. OK TO USE TYLENOL IF NEEDED   __X__ Stop supplements until after surgery.    ____ Bring C-Pap to the hospital.    If you have any questions regarding your pre-procedure instructions,  Please call Pre-admit Testing at [phone]

## 2022-07-20 NOTE — Telephone Encounter (Signed)
Pt has been scheduled for surgical clearance on 8/3 at 2:20 pm

## 2022-07-20 NOTE — Telephone Encounter (Signed)
Pt called stating he is supposed to be having a prostate surgery with [facility] and needs an up to date EKG.

## 2022-07-22 ENCOUNTER — Ambulatory Visit (INDEPENDENT_AMBULATORY_CARE_PROVIDER_SITE_OTHER): Payer: Medicare Other | Admitting: Cardiology

## 2022-07-22 ENCOUNTER — Encounter: Payer: Self-pay | Admitting: Cardiology

## 2022-07-22 VITALS — BP 114/60 | HR 68 | Ht 68.0 in | Wt 201.0 lb

## 2022-07-22 DIAGNOSIS — E7800 Pure hypercholesterolemia, unspecified: Secondary | ICD-10-CM

## 2022-07-22 DIAGNOSIS — I2510 Atherosclerotic heart disease of native coronary artery without angina pectoris: Secondary | ICD-10-CM | POA: Diagnosis not present

## 2022-07-22 DIAGNOSIS — I351 Nonrheumatic aortic (valve) insufficiency: Secondary | ICD-10-CM | POA: Diagnosis not present

## 2022-07-22 DIAGNOSIS — I10 Essential (primary) hypertension: Secondary | ICD-10-CM

## 2022-07-22 NOTE — Progress Notes (Signed)
Cardiology Office Note:    Date:  07/22/2022   ID:  Eric Chambers, DOB 23-Aug-1946, MRN [id number]  PCP:  Lawerance Cruel, MD   [place] [facility] Cardiologist:  Candee Furbish, MD     Referring MD: Lawerance Cruel, MD    History of Present Illness:    Eric Chambers is a 76 y.o. male here for the follow-up and pre-operative clearance for prostate surgery.  Has been noticing decreased urinary stream secondary to benign prostatic hypertrophy.  He is scheduled for holep-laser enucleation with Dr. Diamantina Providence on 07/30/2022.   Previously here for evaluation of recent cough related syncopal episode.  Had a coughing spell, fell face forward into table, facial ecchymosis.  He states that in 2018 he had a spell similar to this.  He also has a prior history of endocarditis in [place] and again here in 2014 treated with IV antibiotics..  Has moderate aortic valve regurgitation.  He does not have any significant aortic stenosis.  There was a TEE performed here in 2014 which I personally reviewed demonstrating a trileaflet aortic valve.  There is not appear to be any fusion of any leaflet segments.  His moderate regurgitation has been stable over the past several years.  Previously had a discussion with Dr. Cyndia Bent in 2019 regarding potential timing of surgery and agree that we are not at that point yet due to lack of symptoms.  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    At his last appointment, he was feeling very good. We reviewed his monitor results in detail. He complained of feeling more stiff, unable to lift as much weight as he used to. In December 2022 his LDL was 81, on 10 mg rosuvastatin. Originally on lipitor 20 mg, but this caused LE muscle cramps in his calves.  We see his wife Eric Chambers as well; she is followed for atrial fibrillation.  Today:  He states he has been feeling well and that he is able to climb stairs without too much  shortness of breath.   He reports he had pneumonia about a month ago with a routine cough. No recurrent syncopal episodes. In the past he had passed out due to a rapid cough.  Since taking valsartan, his at home blood pressures are currently averaging in the 812'X systolic or less. He recalls taking his blood pressures in the morning and getting numbers in the 140's while his body was becoming accustomed to the valsartan. He is very pleased with his recent lower readings.   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 He still walks around the horse barn. When going to the grocery store, he parks farthest away to walk as much as he can. 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  He denies any palpitations, chest pain, or peripheral edema. No lightheadedness, headaches, orthopnea, or PND.   He reports his wife, Eric Chambers, is feeling pretty good.    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 (seen on post tx echo as well)    Enlarged prostate    Heart murmur    "think I have one now" (08/16/2013)   High cholesterol  Hypertension    Pneumonia    "almost once/yr" (08/16/2013)   Sleep apnea    "don't wear mask" (08/16/2013)    Past Surgical History:  Procedure Laterality Date   EYE SURGERY Bilateral    cataract surgery   INGUINAL HERNIA REPAIR Right ~ 1950   TEE WITHOUT CARDIOVERSION N/A 08/17/2013   Procedure: TRANSESOPHAGEAL ECHOCARDIOGRAM (TEE);  Surgeon: Candee Furbish, MD;  Location: [facility] ENDOSCOPY;  Service: Cardiovascular;  Laterality: N/A;   TONSILLECTOMY  ~ 1194   UMBILICAL HERNIA REPAIR  ~ 2011    Current Medications: Current Meds  Medication Sig   montelukast (SINGULAIR) 10 MG tablet Take 10 mg by mouth at bedtime.   rosuvastatin  (CRESTOR) 10 MG tablet Take 10 mg by mouth daily.   valsartan-hydrochlorothiazide (DIOVAN-HCT) 320-12.5 MG tablet Take 1 tablet by mouth daily.     Allergies:   Finasteride   Social History   Socioeconomic History   Marital status: Married    Spouse name: Not on file   Number of children: Not on file   Years of education: Not on file   Highest education level: Not on file  Occupational History   Not on file  Tobacco Use   Smoking status: Never    Passive exposure: Never   Smokeless tobacco: Never  Vaping Use   Vaping Use: Never used  Substance and Sexual Activity   Alcohol use: Yes    Alcohol/week: 2.0 standard drinks of alcohol    Types: 2 Glasses of wine per week    Comment: occasionally   Drug use: No   Sexual activity: Yes  Other Topics Concern   Not on file  Social History Narrative   Not on file   Social Determinants of Health   Financial Resource Strain: Not on file  Food Insecurity: Not on file  Transportation Needs: Not on file  Physical Activity: Not on file  Stress: Not on file  Social Connections: Not on file     Family History: The patient's family history includes Heart attack (age of onset: 77) in his father; Hypertension in his mother.  ROS:   Please see the history of present illness. (+) Exertional shortness of breath  All other systems are reviewed and negative.    EKGs/Labs/Other Studies Reviewed:    The following studies were reviewed today:  Monitor 01/2022: Sinus rhythm with average heart rate of 76 bpm Brief episodes of atrial tachycardia, longest 17 beats with average heart rate of 125 beats minute. No atrial fibrillation, no pauses, no adverse arrhythmias. Rare PVCs and PACs. Overall reassuring monitor in the setting of recent syncope.     Patch Wear Time:  13 days and 19 hours (2023-01-10T11:08:42-0500 to 2023-01-24T07:03:47-0500)   Patient had a min HR of 55 bpm, max HR of 222 bpm, and avg HR of 76 bpm. Predominant  underlying rhythm was Sinus Rhythm. 16 Supraventricular Tachycardia runs occurred, the run with the fastest interval lasting 7 beats with a max rate of 222 bpm, the  longest lasting 17 beats with an avg rate of 125 bpm. Isolated SVEs were rare (<1.0%), SVE Couplets were rare (<1.0%), and SVE Triplets were rare (<1.0%). Isolated VEs were rare (<1.0%), VE Couplets were rare (<1.0%), and no VE Triplets were present.   ECHO [facility] 02/2021: Echo complete  Result Value Ref Range  LV Ejection Fraction (%) 55 %  Right Ventricle Systolic Pressure (mmHg) 16 mmHg  Left Atrium Diameter (cm) 3.7 cm  LV End Diastolic Diameter (cm) 4.4  cm  LV End Systolic Diameter (cm) 1.9 cm  LV Septum Wall Thickness (cm) 1.3 cm  LV Posterior Wall Thickness (cm) 0.9 cm  Tricuspid Valve Regurgitation Grade trivial  Tricuspid Valve Regurgitation Max Velocity (m/s) 1.8 m/s  Mitral Valve Regurgitation Grade none  Mitral Valve Stenosis Grade none  Aortic Valve Regurgitation Grade moderate  Aortic Valve Stenosis Grade none   EKG:  EKG is personally reviewed. 07/22/22: Sinus rhythm. Rate 68 bpm. Left axis deviation   03/23/2022: EKG was not ordered. 12/29/2021: SR 65, borderline first-degree AV block with PR interval of 200 ms.  Recent Labs: 11/13/2021: ALT 18 05/26/2022: BUN 23; Creatinine, Ser 1.40; Hemoglobin 13.6; Platelets 198; Potassium 3.5; Sodium 138  Recent Lipid Panel    Component Value Date/Time   CHOL 157 01/15/2014 0803   TRIG 126.0 01/15/2014 0803   HDL 41.60 01/15/2014 0803   CHOLHDL 4 01/15/2014 0803   VLDL 25.2 01/15/2014 0803   LDLCALC 90 01/15/2014 0803   LDLDIRECT 161.0 10/09/2013 1050     Risk Assessment/Calculations:              Physical Exam:    VS:  BP 114/60 (BP Location: Right Arm, Patient Position: Sitting, Cuff Size: Large)   Pulse 68   Ht '5\' 8"'$  (1.727 m)   Wt 201 lb (91.2 kg)   BMI 30.56 kg/m     Wt Readings from Last 3 Encounters:  07/22/22 201 lb (91.2 kg)  07/20/22 200 lb  (90.7 kg)  06/08/22 197 lb (89.4 kg)     GEN:  Well nourished, well developed in no acute distress HEENT: Normal NECK: No JVD; No carotid bruits LYMPHATICS: No lymphadenopathy CARDIAC: RRR, 2/6 diastolic murmur, no rubs, no gallops RESPIRATORY:  Clear to auscultation without rales, wheezing or rhonchi  ABDOMEN: Soft, non-tender, non-distended MUSCULOSKELETAL:  No edema; No deformity  SKIN: Warm and dry NEUROLOGIC:  Alert and oriented x 3 PSYCHIATRIC:  Normal affect   ASSESSMENT:    1. Nonrheumatic aortic valve insufficiency   2. Pure hypercholesterolemia   3. Primary hypertension      PLAN:    In order of problems listed above:  Preoperative risk stratification - He may proceed with prostate surgery with low overall cardiac risk.  He is able to complete greater than 4 METS of activity without difficulty.  He is having no high risk symptoms or adverse arrhythmias.  Recent echocardiogram showed normal pump function.  He does have aortic regurgitation which has been stable over the last several years, asymptomatic.  Aortic regurgitation Moderate aortic valve regurgitation.  Currently asymptomatic.  We will check an echocardiogram in 6 months.  We will see him back in 6 months.  His last echocardi[facility] was in March 2022 at [facility].   At this time his left ventricular dimensions are normal.  Asymptomatic.  Moderate aortic regurgitation.  No indications for surgery at this time.  We will continue to monitor.  Doing well no changes.   Endocarditis Prior aortic valve endocarditis.  Aortic valve.   Pure hypercholesterolemia LDL 81 HDL 43 total cholesterol 145 triglycerides 120 from outside labs, ALT 27.  Excellent.  Continue with Crestor 10 mg once a day.  Continue.  No myalgias.  He had some trouble previously with atorvastatin.   OSA (obstructive sleep apnea) Currently doing well with treatment.    Follow-up: 2 months  Medication Adjustments/Labs and Tests Ordered: Current  medicines are reviewed at length with the patient today.  Concerns regarding medicines are outlined  above.  Orders Placed This Encounter  Procedures   EKG 12-Lead   No orders of the defined types were placed in this encounter.  Patient Instructions  Medication Instructions:  The current medical regimen is effective;  continue present plan and medications.  *If you need a refill on your cardiac medications before your next appointment, please call your pharmacy*  Testing/Procedures: Please have echocardiogram as scheduled then follow up with Dr Marlou Porch in the office.   Follow-Up: At [facility], you and your health needs are our priority.  As part of our continuing mission to provide you with exceptional heart care, we have created designated Provider Care Teams.  These Care Teams include your primary Cardiologist (physician) and Advanced Practice Providers (APPs -  Physician Assistants and Nurse Practitioners) who all work together to provide you with the care you need, when you need it.  We recommend signing up for the patient portal called "MyChart".  Sign up information is provided on this After Visit Summary.  MyChart is used to connect with patients for Virtual Visits (Telemedicine).  Patients are able to view lab/test results, encounter notes, upcoming appointments, etc.  Non-urgent messages can be sent to your provider as well.   To learn more about what you can do with MyChart, go to [email or web address].    Your next appointment:   2 month(s)  The format for your next appointment:   In Person  Provider:   Candee Furbish, MD {   Important Information About Sugar          I,Breanna Adamick,acting as a scribe for Candee Furbish, MD.,have documented all relevant documentation on the behalf of Candee Furbish, MD,as directed by  Candee Furbish, MD while in the presence of Candee Furbish, MD.   I, Candee Furbish, MD, have reviewed all documentation for this visit. The documentation on  07/22/22 for the exam, diagnosis, procedures, and orders are all accurate and complete.   Signed, Candee Furbish, MD  07/22/2022 3:48 PM    [facility]

## 2022-07-22 NOTE — Patient Instructions (Signed)
Medication Instructions:  The current medical regimen is effective;  continue present plan and medications.  *If you need a refill on your cardiac medications before your next appointment, please call your pharmacy*  Testing/Procedures: Please have echocardiogram as scheduled then follow up with Dr Marlou Porch in the office.   Follow-Up: At [facility], you and your health needs are our priority.  As part of our continuing mission to provide you with exceptional heart care, we have created designated Provider Care Teams.  These Care Teams include your primary Cardiologist (physician) and Advanced Practice Providers (APPs -  Physician Assistants and Nurse Practitioners) who all work together to provide you with the care you need, when you need it.  We recommend signing up for the patient portal called "MyChart".  Sign up information is provided on this After Visit Summary.  MyChart is used to connect with patients for Virtual Visits (Telemedicine).  Patients are able to view lab/test results, encounter notes, upcoming appointments, etc.  Non-urgent messages can be sent to your provider as well.   To learn more about what you can do with MyChart, go to [email or web address].    Your next appointment:   2 month(s)  The format for your next appointment:   In Person  Provider:   Candee Furbish, MD {   Important Information About Sugar

## 2022-07-23 ENCOUNTER — Encounter: Payer: Self-pay | Admitting: Urology

## 2022-07-23 NOTE — Progress Notes (Signed)
Perioperative Services  Pre-Admission/Anesthesia Testing Clinical Review  Date: 07/28/22  Patient Demographics:  Name: Eric Chambers DOB:   04/05/1946 MRN:   [id number]  Planned Surgical Procedure(s):    Case: [id number] Date/Time: 07/30/22 0715   Procedure: HOLEP-LASER ENUCLEATION OF THE PROSTATE WITH MORCELLATION   Anesthesia type: General   Pre-op diagnosis: Benign Prostatic Hyperplasia with Urinary Obstruction   Location: [facility] OR ROOM 10 / [place] [facility]   Surgeons: Billey Co, MD   NOTE: Available PAT nursing documentation and vital signs have been reviewed. Clinical nursing staff has updated patient's PMH/PSHx, current medication list, and drug allergies/intolerances to ensure comprehensive history available to assist in medical decision making as it pertains to the aforementioned surgical procedure and anticipated anesthetic course. Extensive review of available clinical information performed. [place] PMH and PSHx updated with any diagnoses/procedures that  may have been inadvertently omitted during his intake with the pre-admission testing department's nursing staff.  Clinical Discussion:  Eric Chambers is a 76 y.o. male who is submitted for pre-surgical anesthesia review and clearance prior to him undergoing the above procedure. Patient has never been a smoker. Pertinent PMH includes: diastolic dysfunction, aortic valve regurgitation, aortic root dilatation, PSVT, cardiac murmur, HTN, HLD, GERD (no daily Tx), OSAH (does not require nocturnal PAP therapy), chronic cough, recurrent pneumonia, BPH.  Patient is followed by cardiology Marlou Porch, MD). He was last seen in the cardiology clinic on 07/22/2022; notes reviewed.  At the time of this clinic visit, patient doing well overall from a cardiovascular perspective.  He denies any episodes of chest pain, however continues to experience some episodes of exertional dyspnea.  He denied any PND, orthopnea, palpitations,  significant peripheral edema, vertiginous symptoms, or presyncope/syncope.  Patient with a past medical history significant for cardiovascular diagnoses.  Most recent TTE was performed on 03/02/2021 revealing a normal left ventricular systolic function mild LVH; LVEF >55%.  GLS -16.0%.  There was mild mitral and tricuspid valve annular calcification with no evidence of stenosis.  There was trivial tricuspid and moderate aortic/pulmonary valve regurgitation.  All transvalvular gradients were normal indicating no valvular stenosis.  Right ventricular size and function normal.  PASP normal. Thoracic aortic dilatation noted; measured 38 mm.  Long-term cardiac event monitor study performed at 01/18/2022 revealed a underlying sinus rhythm with an average heart rate of 76 bpm; range 55-122 bpm. There were 16 SVT runs noted with the fastest lasting 7 beats at a maximum rate of 222 bpm and the longest lasting 17 beats at an average rate of 125 bpm. There were no sustained pauses or adverse arrhythmias noted.  Blood pressure well-controlled at 114/60 on ARB (valsartan) and diuretic (HCTZ) therapies.  Patient is on rosuvastatin for his HLD diagnosis and ASCVD prevention.  He is not diabetic.  Patient maintains an active lifestyle.  Patient does have an OSAH diagnosis, however he does not require the use of nocturnal PAP therapy.  Functional capacity, as defined by DASI, is documented as being >/= 4 METS.  No changes were made to his medication management.  Patient follow-up with outpatient cardiology in 2 months or sooner if needed.  Eric Chambers is scheduled for an [place] WITH MORCELLATION for treatment of his BPH with associated BOO on 07/30/2022 with Dr. Nickolas Madrid, MD.  Given patient's past medical history significant for cardiovascular diagnoses, presurgical cardiac clearance was sought by the PAT team.  Per cardiology, "he may proceed with prostate surgery with LOW overall  cardiac  risk.  He is able to complete greater than 4 METS of activity without difficulty.  He is having no high risk symptoms or adverse arrhythmias.  Recent echocardiogram showed normal pump function.  He does have aortic regurgitation which has been stable over the last several years, asymptomatic". In review of his medication reconciliation, the patient is not noted to be taking any type of anticoagulation or antiplatelet therapies that would need to be held during his perioperative course.  Patient denies previous perioperative complications with anesthesia in the past. In review of the available records, it is noted that patient underwent a MAC anesthetic course at [facility] ([facility] II) in 06/2022 without documented complications.      07/22/2022    2:23 PM 07/20/2022   11:38 AM 06/08/2022    9:45 AM  Vitals with BMI  Height _0  _1  _2   Weight 201 lbs 200 lbs 197 lbs  BMI 30.57 63.89 37.34  Systolic 287  681  Diastolic 60  60  Pulse 68  71    Providers/Specialists:   NOTE: Primary physician provider listed below. Patient may have been seen by APP or partner within same practice.   PROVIDER ROLE / SPECIALTY LAST Ranae Pila, MD Urology (Surgeon) 06/03/2022  Lawerance Cruel, MD Primary Care Provider ???  Candee Furbish, MD Cardiology 07/22/2022  Leslye Peer, MD Pulmonary Medicine 06/08/2022   Allergies:  Finasteride  Current Home Medications:   No current facility-administered medications for this encounter.    montelukast (SINGULAIR) 10 MG tablet   rosuvastatin (CRESTOR) 10 MG tablet   valsartan-hydrochlorothiazide (DIOVAN-HCT) 320-12.5 MG tablet   History:   Past Medical History:  Diagnosis Date   Aortic root dilatation ([place]) 04/08/2016   a.) TTE 04/08/2016: Ao root measured 39 mm; b.) TTE 01/12/2018: Ao root measured 38 mm; c.) TTE 06/20/2020: Ao root measured 40 mm, asc Ao 39 mm   Aortic valve regurgitation    Bilateral cataracts     a.) s/p extraction with IOL placement   Diastolic dysfunction    a.) TTE 04/08/2016: EF 55-60%, triv TR, mod AR, atrial septal aneurysm, G1DD; b.) TTE 01/12/2018: EF 60-65%, mild-mod AoV calcification, mod-sev AR, G1DD; c.) TTE 06/20/2020: EF 65-70%, sev LVH, mild AoV sclerosis with mod AR, triv MR; d.) TTE 03/02/2021: EF >55%, mild LVH, GLS -16.0%, MAC/TAC, trivial TR, moderate AR/PR.   Endocarditis 08/17/2013   Possible vegetation associated with tricuspid valve (seen on post tx echo as well)    Endocarditis 2009   a.) while living in [place]   Enlarged prostate    GERD (gastroesophageal reflux disease)    Heart murmur    "think I have one now" (08/16/2013)   High cholesterol    Hypertension    PSVT (paroxysmal supraventricular tachycardia) ([place]) 01/18/2022   a.) Holter 01/18/2022: 16 SVT runs with fastest interval lasting 7 beats with a max rate of 222 bpm, longest lasted 17 beats at a rate of 125 bpm   Recurrent pneumonia    "almost once/yr" (08/16/2013)   Sleep apnea    "don't wear mask" (08/16/2013)   Squamous cell carcinoma of skin of scalp    Umbilical hernia    a.) s/p repair in approx 2011   Past Surgical History:  Procedure Laterality Date   CATARACT EXTRACTION W/ INTRAOCULAR LENS IMPLANT Right 07/05/2022   Procedure: CATARACT EXTRACTION W/ INTRAOCULAR LENS IMPLANT; Location: [facility]   CATARACT EXTRACTION W/ INTRAOCULAR LENS IMPLANT Left 07/19/2022  Procedure: CATARACT EXTRACTION W/ INTRAOCULAR LENS IMPLANT; Location: [facility]   INGUINAL HERNIA REPAIR Right ~ 1950   TEE WITHOUT CARDIOVERSION N/A 08/17/2013   Procedure: TRANSESOPHAGEAL ECHOCARDIOGRAM (TEE);  Surgeon: Candee Furbish, MD;  Location: [facility] ENDOSCOPY;  Service: Cardiovascular;  Laterality: N/A;   TONSILLECTOMY  ~ 7494   UMBILICAL HERNIA REPAIR  ~ 2011   Family History  Problem Relation Age of Onset   Hypertension Mother    Heart attack Father 72   Social History   Tobacco Use   Smoking status: Never     Passive exposure: Never   Smokeless tobacco: Never  Vaping Use   Vaping Use: Never used  Substance Use Topics   Alcohol use: Yes    Alcohol/week: 2.0 standard drinks of alcohol    Types: 2 Glasses of wine per week    Comment: occasionally   Drug use: No    Pertinent Clinical Results:  LABS: Labs reviewed: Acceptable for surgery.  Lab Results  Component Value Date   WBC 9.1 05/26/2022   HGB 13.6 05/26/2022   HCT 40.5 05/26/2022   MCV 89.8 05/26/2022   PLT 198 05/26/2022   Lab Results  Component Value Date   NA 138 05/26/2022   K 3.5 05/26/2022   CO2 27 05/26/2022   GLUCOSE 149 (H) 05/26/2022   BUN 23 05/26/2022   CREATININE 1.40 (H) 05/26/2022   CALCIUM 8.6 (L) 05/26/2022   GFRNONAA 52 (L) 05/26/2022     ECG: Date: 07/22/2022 Time ECG obtained: 1326 PM Rate: 68 bpm Rhythm: normal sinus Axis (leads I and aVF): Left Intervals: PR 194 ms. QRS 98 ms. QTc 450 ms. ST segment and T wave changes: No evidence of acute ST segment elevation or depression Comparison: Similar to previous tracing obtained on 12/29/2021   IMAGING / PROCEDURES: DIAGNOSTIC RADIOGRAPHS OF CHEST 2 VIEWS performed on 06/08/2022 Normal lung volumes Cardiomediastinal contours are normal Lungs are clear Pulmonary vasculature are normal No consolidation, pleural effusion, or PTX No visualized pulmonary nodule or mass Mild mid thoracic spondylosis no acute osseous abnormalities No acute chest findings or explanation for cough  MRI PROSTATE W WO CONTRAST performed on 02/14/2022 No findings of microscopic or high-grade prostate carcinoma Moderate to marked benign prostatic hyperplasia; volume = 130 cm  LONG TERM CARDIAC EVENT MONITOR STUDY performed on 01/18/2022 Predominant underlying sinus rhythm at an average heart rate of 76 bpm Brief episodes of atrial tachycardia with the longest lasting 17 beats at an average rate of 125 bpm  Rare PVCs and PACs No atrial fibrillation, pauses, or  significant arrhythmias  TRANSTHORACIC ECHOCARDIOGRAM performed on 06/20/2020 Left ventricular ejection fraction, by estimation, is 65 to 70%. The left ventricle has normal function. The left ventricle has no regional wall motion abnormalities. There is severe asymmetric left ventricular hypertrophy of the basal-septal segment. Left ventricular diastolic parameters were normal.  Right ventricular systolic function is normal. The right ventricular size is normal. There is normal pulmonary artery systolic pressure.  The aortic valve is tricuspid. Aortic valve regurgitation is moderate. Mild aortic valve sclerosis is present, with no evidence of aortic valve stenosis. Aortic regurgitation PHT measures 400 msec.  The mitral valve is normal in structure. Trivial mitral valve regurgitation.  Aortic dilatation noted. There is mild dilatation of the aortic root measuring 40 mm. Mild dilatation of the ascending aorta measuring 105m.   Impression and Plan:  Eric Esselmanhas been referred for pre-anesthesia review and clearance prior to him undergoing the planned anesthetic and procedural  courses. Available labs, pertinent testing, and imaging results were personally reviewed by me. This patient has been appropriately cleared by cardiology with an overall LOW risk of significant perioperative cardiovascular complications.  Based on clinical review performed today (07/28/22), barring any significant acute changes in the patient's overall condition, it is anticipated that he will be able to proceed with the planned surgical intervention. Any acute changes in clinical condition may necessitate his procedure being postponed and/or cancelled. Patient will meet with anesthesia team (MD and/or CRNA) on the day of his procedure for preoperative evaluation/assessment. Questions regarding anesthetic course will be fielded at that time.   Pre-surgical instructions were reviewed with the patient during his PAT appointment  and questions were fielded by PAT clinical staff. Patient was advised that if any questions or concerns arise prior to his procedure then he should return a call to PAT and/or his surgeon's office to discuss.  Honor Loh, MSN, APRN, FNP-C, CEN [facility]  Peri-operative Services Nurse Practitioner Phone: [phone] Fax: [phone] 07/28/22 1:28 PM  NOTE: This note has been prepared using [employer]. Despite my best ability to proofread, there is always the potential that unintentional transcriptional errors may still occur from this process.

## 2022-07-27 ENCOUNTER — Encounter: Payer: Self-pay | Admitting: Urology

## 2022-07-28 ENCOUNTER — Encounter: Payer: Self-pay | Admitting: Urology

## 2022-07-30 ENCOUNTER — Encounter
Admission: RE | Disposition: A | Discharge status: Home / Self Care | Payer: Self-pay | Source: Home / Self Care | Attending: Urology

## 2022-07-30 ENCOUNTER — Encounter: Payer: Self-pay | Admitting: Urology

## 2022-07-30 ENCOUNTER — Other Ambulatory Visit: Payer: Self-pay

## 2022-07-30 ENCOUNTER — Ambulatory Visit
Admission: RE | Admit: 2022-07-30 | Discharge: 2022-07-30 | Disposition: A | Discharge status: Home / Self Care | Payer: Medicare Other | Attending: Urology | Admitting: Urology

## 2022-07-30 ENCOUNTER — Ambulatory Visit: Payer: Medicare Other | Admitting: Urgent Care

## 2022-07-30 DIAGNOSIS — N138 Other obstructive and reflux uropathy: Secondary | ICD-10-CM | POA: Diagnosis not present

## 2022-07-30 DIAGNOSIS — N419 Inflammatory disease of prostate, unspecified: Secondary | ICD-10-CM | POA: Insufficient documentation

## 2022-07-30 DIAGNOSIS — E876 Hypokalemia: Secondary | ICD-10-CM

## 2022-07-30 DIAGNOSIS — Z01818 Encounter for other preprocedural examination: Secondary | ICD-10-CM

## 2022-07-30 DIAGNOSIS — N401 Enlarged prostate with lower urinary tract symptoms: Secondary | ICD-10-CM | POA: Diagnosis present

## 2022-07-30 DIAGNOSIS — I10 Essential (primary) hypertension: Secondary | ICD-10-CM | POA: Diagnosis not present

## 2022-07-30 DIAGNOSIS — Z79899 Other long term (current) drug therapy: Secondary | ICD-10-CM | POA: Diagnosis present

## 2022-07-30 HISTORY — DX: Other ill-defined heart diseases: I51.89

## 2022-07-30 HISTORY — DX: Unspecified cataract: H26.9

## 2022-07-30 HISTORY — DX: Squamous cell carcinoma of skin of scalp and neck: C44.42

## 2022-07-30 HISTORY — DX: Gastro-esophageal reflux disease without esophagitis: K21.9

## 2022-07-30 HISTORY — PX: HOLEP-LASER ENUCLEATION OF THE PROSTATE WITH MORCELLATION: SHX6641

## 2022-07-30 HISTORY — DX: Umbilical hernia without obstruction or gangrene: K42.9

## 2022-07-30 HISTORY — DX: Pneumonia, unspecified organism: J18.9

## 2022-07-30 LAB — POCT I-STAT, CHEM 8
BUN: 20 mg/dL (ref 8–23)
Calcium, Ion: 1.14 mmol/L — ABNORMAL LOW (ref 1.15–1.40)
Chloride: 102 mmol/L (ref 98–111)
Creatinine, Ser: 1.3 mg/dL — ABNORMAL HIGH (ref 0.61–1.24)
Glucose, Bld: 129 mg/dL — ABNORMAL HIGH (ref 70–99)
HCT: 38 % — ABNORMAL LOW (ref 39.0–52.0)
Hemoglobin: 12.9 g/dL — ABNORMAL LOW (ref 13.0–17.0)
Potassium: 3.8 mmol/L (ref 3.5–5.1)
Sodium: 140 mmol/L (ref 135–145)
TCO2: 27 mmol/L (ref 22–32)

## 2022-07-30 SURGERY — ENUCLEATION, PROSTATE, USING LASER, WITH MORCELLATION
Anesthesia: General | Site: Prostate

## 2022-07-30 MED ORDER — MIDAZOLAM HCL 2 MG/2ML IJ SOLN
INTRAMUSCULAR | Status: AC
Start: 2022-07-30 — End: ?
  Filled 2022-07-30: qty 2

## 2022-07-30 MED ORDER — LACTATED RINGERS IV SOLN: INTRAVENOUS | Status: DC

## 2022-07-30 MED ORDER — ONDANSETRON HCL 4 MG/2ML IJ SOLN
INTRAMUSCULAR | Status: AC
  Filled 2022-07-30: qty 2

## 2022-07-30 MED ORDER — CHLORHEXIDINE GLUCONATE 0.12 % MT SOLN: 15.0000 mL | Freq: Once | OROMUCOSAL | Status: AC

## 2022-07-30 MED ORDER — FAMOTIDINE 20 MG PO TABS
ORAL_TABLET | ORAL | Status: AC
  Administered 2022-07-30: 20 mg via ORAL
  Filled 2022-07-30: qty 1

## 2022-07-30 MED ORDER — HYDROCODONE-ACETAMINOPHEN 5-325 MG PO TABS: 1.0000 | ORAL_TABLET | Freq: Four times a day (QID) | ORAL | 0 refills | Status: AC | PRN

## 2022-07-30 MED ORDER — FENTANYL CITRATE (PF) 100 MCG/2ML IJ SOLN
INTRAMUSCULAR | Status: DC | PRN
  Administered 2022-07-30: 25 ug via INTRAVENOUS
  Administered 2022-07-30: 50 ug via INTRAVENOUS
  Administered 2022-07-30: 25 ug via INTRAVENOUS

## 2022-07-30 MED ORDER — FENTANYL CITRATE (PF) 100 MCG/2ML IJ SOLN
INTRAMUSCULAR | Status: AC
  Filled 2022-07-30: qty 2

## 2022-07-30 MED ORDER — PROPOFOL 10 MG/ML IV BOLUS
INTRAVENOUS | Status: AC
  Filled 2022-07-30: qty 20

## 2022-07-30 MED ORDER — DEXAMETHASONE SODIUM PHOSPHATE 10 MG/ML IJ SOLN
INTRAMUSCULAR | Status: DC | PRN
  Administered 2022-07-30: 5 mg via INTRAVENOUS

## 2022-07-30 MED ORDER — CEFAZOLIN SODIUM-DEXTROSE 2-4 GM/100ML-% IV SOLN
INTRAVENOUS | Status: AC
  Filled 2022-07-30: qty 100

## 2022-07-30 MED ORDER — SUGAMMADEX SODIUM 200 MG/2ML IV SOLN
INTRAVENOUS | Status: DC | PRN
  Administered 2022-07-30: 200 mg via INTRAVENOUS

## 2022-07-30 MED ORDER — PROMETHAZINE HCL 25 MG/ML IJ SOLN: 6.2500 mg | INTRAMUSCULAR | Status: DC | PRN

## 2022-07-30 MED ORDER — PHENYLEPHRINE HCL (PRESSORS) 10 MG/ML IV SOLN
INTRAVENOUS | Status: DC | PRN
  Administered 2022-07-30 (×2): 160 ug via INTRAVENOUS

## 2022-07-30 MED ORDER — LIDOCAINE HCL (PF) 2 % IJ SOLN
INTRAMUSCULAR | Status: AC
Start: 2022-07-30 — End: ?
  Filled 2022-07-30: qty 5

## 2022-07-30 MED ORDER — ROCURONIUM BROMIDE 100 MG/10ML IV SOLN
INTRAVENOUS | Status: DC | PRN
  Administered 2022-07-30: 10 mg via INTRAVENOUS
  Administered 2022-07-30: 50 mg via INTRAVENOUS

## 2022-07-30 MED ORDER — FAMOTIDINE 20 MG PO TABS: 20.0000 mg | ORAL_TABLET | Freq: Once | ORAL | Status: AC

## 2022-07-30 MED ORDER — SODIUM CHLORIDE 0.9 % IR SOLN
Status: DC | PRN
  Administered 2022-07-30 (×12): 3000 mL

## 2022-07-30 MED ORDER — OXYCODONE HCL 5 MG PO TABS: 5.0000 mg | ORAL_TABLET | Freq: Once | ORAL | Status: DC | PRN

## 2022-07-30 MED ORDER — DEXAMETHASONE SODIUM PHOSPHATE 10 MG/ML IJ SOLN
INTRAMUSCULAR | Status: AC
  Filled 2022-07-30: qty 1

## 2022-07-30 MED ORDER — CEFAZOLIN SODIUM-DEXTROSE 2-4 GM/100ML-% IV SOLN
2.0000 g | INTRAVENOUS | Status: AC
  Administered 2022-07-30: 2 g via INTRAVENOUS

## 2022-07-30 MED ORDER — CHLORHEXIDINE GLUCONATE 0.12 % MT SOLN: 15.0000 mL | Freq: Once | OROMUCOSAL | Status: DC

## 2022-07-30 MED ORDER — EPHEDRINE SULFATE (PRESSORS) 50 MG/ML IJ SOLN
INTRAMUSCULAR | Status: DC | PRN
  Administered 2022-07-30: 10 mg via INTRAVENOUS

## 2022-07-30 MED ORDER — MIDAZOLAM HCL 2 MG/2ML IJ SOLN
INTRAMUSCULAR | Status: DC | PRN
  Administered 2022-07-30: 2 mg via INTRAVENOUS

## 2022-07-30 MED ORDER — LIDOCAINE HCL (CARDIAC) PF 100 MG/5ML IV SOSY
PREFILLED_SYRINGE | INTRAVENOUS | Status: DC | PRN
  Administered 2022-07-30: 100 mg via INTRAVENOUS

## 2022-07-30 MED ORDER — ACETAMINOPHEN 10 MG/ML IV SOLN
INTRAVENOUS | Status: DC | PRN
  Administered 2022-07-30: 1000 mg via INTRAVENOUS

## 2022-07-30 MED ORDER — ACETAMINOPHEN 10 MG/ML IV SOLN: 1000.0000 mg | Freq: Once | INTRAVENOUS | Status: DC | PRN

## 2022-07-30 MED ORDER — DROPERIDOL 2.5 MG/ML IJ SOLN
0.6250 mg | Freq: Once | INTRAMUSCULAR | Status: DC | PRN
Start: 2022-07-30 — End: 2022-07-30

## 2022-07-30 MED ORDER — EPHEDRINE 5 MG/ML INJ
INTRAVENOUS | Status: AC
  Filled 2022-07-30: qty 5

## 2022-07-30 MED ORDER — ROCURONIUM BROMIDE 10 MG/ML (PF) SYRINGE
PREFILLED_SYRINGE | INTRAVENOUS | Status: AC
Start: 2022-07-30 — End: ?
  Filled 2022-07-30: qty 10

## 2022-07-30 MED ORDER — PHENYLEPHRINE 80 MCG/ML (10ML) SYRINGE FOR IV PUSH (FOR BLOOD PRESSURE SUPPORT)
PREFILLED_SYRINGE | INTRAVENOUS | Status: AC
  Filled 2022-07-30: qty 10

## 2022-07-30 MED ORDER — ORAL CARE MOUTH RINSE: 15.0000 mL | Freq: Once | OROMUCOSAL | Status: DC

## 2022-07-30 MED ORDER — CHLORHEXIDINE GLUCONATE 0.12 % MT SOLN
OROMUCOSAL | Status: AC
  Administered 2022-07-30: 15 mL via OROMUCOSAL
  Filled 2022-07-30: qty 15

## 2022-07-30 MED ORDER — ORAL CARE MOUTH RINSE: 15.0000 mL | Freq: Once | OROMUCOSAL | Status: AC

## 2022-07-30 MED ORDER — ONDANSETRON HCL 4 MG/2ML IJ SOLN
INTRAMUSCULAR | Status: DC | PRN
  Administered 2022-07-30: 4 mg via INTRAVENOUS

## 2022-07-30 MED ORDER — OXYCODONE HCL 5 MG/5ML PO SOLN: 5.0000 mg | Freq: Once | ORAL | Status: DC | PRN

## 2022-07-30 MED ORDER — PROPOFOL 10 MG/ML IV BOLUS
INTRAVENOUS | Status: DC | PRN
  Administered 2022-07-30: 120 mg via INTRAVENOUS

## 2022-07-30 MED ORDER — FENTANYL CITRATE (PF) 100 MCG/2ML IJ SOLN: 25.0000 ug | INTRAMUSCULAR | Status: DC | PRN

## 2022-07-30 SURGICAL SUPPLY — 33 items
ADAPTER IRRIG TUBE 2 SPIKE SOL (ADAPTER) ×4 IMPLANT
BAG URO DRAIN 4000ML (MISCELLANEOUS) ×2 IMPLANT
CATH FOLEY 3WAY 30CC 24FR (CATHETERS) ×1
CATH URETL OPEN END 4X70 (CATHETERS) ×2 IMPLANT
CATH URTH STD 24FR FL 3W 2 (CATHETERS) ×1 IMPLANT
CONTAINER COLLECT MORCELLATR (MISCELLANEOUS) ×1 IMPLANT
DRAPE UTILITY 15X26 TOWEL STRL (DRAPES) ×1 IMPLANT
ELECT BIVAP BIPO 22/24 DONUT (ELECTROSURGICAL)
ELECTRD BIVAP BIPO 22/24 DONUT (ELECTROSURGICAL) IMPLANT
FIBER LASER MOSES 550 DFL (Laser) ×2 IMPLANT
FILTER OVERFLOW MORCELLATOR (FILTER) ×1 IMPLANT
GLOVE SURG UNDER POLY LF SZ7.5 (GLOVE) ×2 IMPLANT
GOWN STRL REUS W/ TWL LRG LVL3 (GOWN DISPOSABLE) ×1 IMPLANT
GOWN STRL REUS W/ TWL XL LVL3 (GOWN DISPOSABLE) ×1 IMPLANT
GOWN STRL REUS W/TWL LRG LVL3 (GOWN DISPOSABLE) ×1
GOWN STRL REUS W/TWL XL LVL3 (GOWN DISPOSABLE) ×1
HOLDER FOLEY CATH W/STRAP (MISCELLANEOUS) ×2 IMPLANT
IV NS IRRIG 3000ML ARTHROMATIC (IV SOLUTION) ×17 IMPLANT
KIT TURNOVER CYSTO (KITS) ×2 IMPLANT
MBRN O SEALING YLW 17 FOR INST (MISCELLANEOUS) ×2
MEMBRANE SLNG YLW 17 FOR INST (MISCELLANEOUS) ×1 IMPLANT
MORCELLATOR COLLECT CONTAINER (MISCELLANEOUS) ×2
MORCELLATOR OVERFLOW FILTER (FILTER) ×2
MORCELLATOR ROTATION 4.75 335 (MISCELLANEOUS) ×2 IMPLANT
PACK CYSTO AR (MISCELLANEOUS) ×2 IMPLANT
SET CYSTO W/LG BORE CLAMP LF (SET/KITS/TRAYS/PACK) ×2 IMPLANT
SET IRRIG Y TYPE TUR BLADDER L (SET/KITS/TRAYS/PACK) ×2 IMPLANT
SLEEVE PROTECTION STRL DISP (MISCELLANEOUS) ×4 IMPLANT
SURGILUBE 2OZ TUBE FLIPTOP (MISCELLANEOUS) ×2 IMPLANT
SYR TOOMEY IRRIG 70ML (MISCELLANEOUS) ×2
SYRINGE TOOMEY IRRIG 70ML (MISCELLANEOUS) ×1 IMPLANT
TUBE PUMP MORCELLATOR PIRANHA (TUBING) ×2 IMPLANT
WATER STERILE IRR 1000ML POUR (IV SOLUTION) ×2 IMPLANT

## 2022-07-30 NOTE — Transfer of Care (Signed)
Immediate Anesthesia Transfer of Care Note  Patient: Beatrice Sehgal  Procedure(s) Performed: HOLEP-LASER ENUCLEATION OF THE PROSTATE WITH MORCELLATION (Prostate)  Patient Location: PACU  Anesthesia Type:General  Level of Consciousness: awake  Airway & Oxygen Therapy: Patient Spontanous Breathing  Post-op Assessment: Report given to RN and Post -op Vital signs reviewed and stable  Post vital signs: Reviewed and stable  Last Vitals:  Vitals Value Taken Time  BP 146/71 07/30/22 1043  Temp 35.9 1043  Pulse 78 1043  Resp 19 07/30/22 1046  SpO2 98 1043  Vitals shown include unvalidated device data.  Last Pain:  Vitals:   07/30/22 0620  TempSrc: Oral  PainSc: 0-No pain         Complications: No notable events documented.

## 2022-07-30 NOTE — H&P (Signed)
07/30/22 7:23 AM   Eric Chambers 05-11-1946 [id number]  CC: BPH  HPI: 76 year old male with long history of BPH and obstructive urinary symptoms, has not tolerated medications including Flomax and finasteride.  Also long history of elevated PSA with 3 prior negative biopsies, negative prostate MRI with prostate measuring 130 g.  He opted for HOLEP.   PMH: Past Medical History:  Diagnosis Date   Aortic root dilatation ([place]) 04/08/2016   a.) TTE 04/08/2016: Ao root measured 39 mm; b.) TTE 01/12/2018: Ao root measured 38 mm; c.) TTE 06/20/2020: Ao root measured 40 mm, asc Ao 39 mm   Aortic valve regurgitation    Bilateral cataracts    a.) s/p extraction with IOL placement   Diastolic dysfunction    a.) TTE 04/08/2016: EF 55-60%, triv TR, mod AR, atrial septal aneurysm, G1DD; b.) TTE 01/12/2018: EF 60-65%, mild-mod AoV calcification, mod-sev AR, G1DD; c.) TTE 06/20/2020: EF 65-70%, sev LVH, mild AoV sclerosis with mod AR, triv MR; d.) TTE 03/02/2021: EF >55%, mild LVH, GLS -16.0%, MAC/TAC, trivial TR, moderate AR/PR.   Endocarditis 08/17/2013   Possible vegetation associated with tricuspid valve (seen on post tx echo as well)    Endocarditis 2009   a.) while living in [place]   Enlarged prostate    GERD (gastroesophageal reflux disease)    Heart murmur    "think I have one now" (08/16/2013)   High cholesterol    Hypertension    PSVT (paroxysmal supraventricular tachycardia) ([place]) 01/18/2022   a.) Holter 01/18/2022: 16 SVT runs with fastest interval lasting 7 beats with a max rate of 222 bpm, longest lasted 17 beats at a rate of 125 bpm   Recurrent pneumonia    "almost once/yr" (08/16/2013)   Sleep apnea    "don't wear mask" (08/16/2013)   Squamous cell carcinoma of skin of scalp    Umbilical hernia    a.) s/p repair in approx 2011    Surgical History: Past Surgical History:  Procedure Laterality Date   CATARACT EXTRACTION W/ INTRAOCULAR LENS IMPLANT Right 07/05/2022    Procedure: CATARACT EXTRACTION W/ INTRAOCULAR LENS IMPLANT; Location: [facility]   CATARACT EXTRACTION W/ INTRAOCULAR LENS IMPLANT Left 07/19/2022   Procedure: CATARACT EXTRACTION W/ INTRAOCULAR LENS IMPLANT; Location: [facility]   INGUINAL HERNIA REPAIR Right ~ 1950   TEE WITHOUT CARDIOVERSION N/A 08/17/2013   Procedure: TRANSESOPHAGEAL ECHOCARDIOGRAM (TEE);  Surgeon: Candee Furbish, MD;  Location: [facility] ENDOSCOPY;  Service: Cardiovascular;  Laterality: N/A;   TONSILLECTOMY  ~ 3825   UMBILICAL HERNIA REPAIR  ~ 2011     Family History: Family History  Problem Relation Age of Onset   Hypertension Mother    Heart attack Father 86    Social History:  reports that he has never smoked. He has never been exposed to tobacco smoke. He has never used smokeless tobacco. He reports current alcohol use of about 2.0 standard drinks of alcohol per week. He reports that he does not use drugs.  Physical Exam: BP (!) 127/58 (BP Location: Right Arm)   Pulse 64   Temp (!) 97.1 F (36.2 C) (Oral)   Resp 18   Ht _0  (1.727 m)   Wt 91.2 kg   SpO2 98%   BMI 30.56 kg/m    Constitutional:  Alert and oriented, No acute distress. Cardiovascular: Regular rate and rhythm Respiratory: Clear to auscultation bilaterally GI: Abdomen is soft, nontender, nondistended, no abdominal masses  Assessment & Plan:   76 year old male with 130 g prostate  and obstructive urinary symptoms, failed medical expulsive therapy.  Also has history of significantly elevated PSA of 20 with 3 negative biopsies and negative prostate MRI.  We discussed the risks and benefits of HoLEP at length.  The procedure requires general anesthesia and takes 1 to 2 hours, and a holmium laser is used to enucleate the prostate and push this tissue into the bladder.  A morcellator is then used to remove this tissue, which is sent for pathology.  The vast majority(>95%) of patients are able to discharge the same day with a catheter in place for 2 to 3 days, and  will follow-up in clinic for a voiding trial.  We specifically discussed the risks of bleeding, infection, retrograde ejaculation, temporary urgency and urge incontinence, very low risk of long-term incontinence, urethral stricture/bladder neck contracture, pathologic evaluation of prostate tissue and possible detection of prostate cancer or other malignancy, and possible need for additional procedures.  HOLEP today  Nickolas Madrid, MD 07/30/2022  [facility] [address], [place], [place] [address] [phone]

## 2022-07-30 NOTE — Progress Notes (Signed)
D/c CBI.  Urine clear to pink

## 2022-07-30 NOTE — Progress Notes (Signed)
   07/30/22 0700  Clinical Encounter Type  Visited With Patient and family together  Visit Type Initial;Pre-op  Spiritual Encounters  Spiritual Needs Prayer   Chaplain provided pre-op support for patient and wife.

## 2022-07-30 NOTE — Anesthesia Preprocedure Evaluation (Addendum)
Anesthesia Evaluation  Patient identified by MRN, date of birth, ID band Patient awake    Reviewed: Allergy & Precautions, NPO status , Patient's Chart, lab work & pertinent test results  Airway Mallampati: III  TM Distance: >3 FB Neck ROM: full    Dental  (+) Chipped   Pulmonary sleep apnea ,  Reports pneumonia early 2023   Pulmonary exam normal        Cardiovascular Exercise Tolerance: Good hypertension, Normal cardiovascular exam+ dysrhythmias (psvt) + Valvular Problems/Murmurs AI   TTE 06/20/2020: EF 65-70%, sev LVH, mild AoV sclerosis with mod AR, triv MR; d.) TTE 03/02/2021: EF >55%, mild LVH, GLS -16.0%, MAC/TAC, trivial TR, moderate AR/PR.  Holter 01/18/2022: 16 SVT runs with fastest interval lasting 7 beats with a max rate of 222 bpm, longest lasted 17 beats at a rate of 125 bpm  Aortic root dilatation    Neuro/Psych negative neurological ROS  negative psych ROS   GI/Hepatic negative GI ROS, Neg liver ROS,   Endo/Other  negative endocrine ROS  Renal/GU      Musculoskeletal   Abdominal   Peds  Hematology negative hematology ROS (+)   Anesthesia Other Findings Benign Prostatic Hyperplasia with Urinary Obstruction  Reproductive/Obstetrics negative OB ROS                            Anesthesia Physical Anesthesia Plan  ASA: 2  Anesthesia Plan: General ETT   Post-op Pain Management: Ofirmev IV (intra-op)* and Toradol IV (intra-op)*   Induction: Intravenous  PONV Risk Score and Plan: 2 and Ondansetron and Dexamethasone  Airway Management Planned: Oral ETT  Additional Equipment:   Intra-op Plan:   Post-operative Plan: Extubation in OR  Informed Consent: I have reviewed the patients History and Physical, chart, labs and discussed the procedure including the risks, benefits and alternatives for the proposed anesthesia with the patient or authorized representative who has  indicated his/her understanding and acceptance.     Dental Advisory Given  Plan Discussed with: Anesthesiologist, CRNA and Surgeon  Anesthesia Plan Comments:        Anesthesia Quick Evaluation

## 2022-07-30 NOTE — Anesthesia Procedure Notes (Signed)
Procedure Name: Intubation Date/Time: 07/30/2022 9:11 AM  Performed by: Cammie Sickle, CRNAPre-anesthesia Checklist: Patient identified, Patient being monitored, Timeout performed, Emergency Drugs available and Suction available Patient Re-evaluated:Patient Re-evaluated prior to induction Oxygen Delivery Method: Circle system utilized Preoxygenation: Pre-oxygenation with 100% oxygen Induction Type: IV induction Ventilation: Mask ventilation without difficulty Laryngoscope Size: 3 and McGraph Grade View: Grade I Tube type: Oral Tube size: 7.0 mm Number of attempts: 1 Airway Equipment and Method: Stylet Placement Confirmation: ETT inserted through vocal cords under direct vision, positive ETCO2 and breath sounds checked- equal and bilateral Secured at: 22 cm Tube secured with: Tape Dental Injury: Teeth and Oropharynx as per pre-operative assessment

## 2022-07-30 NOTE — Op Note (Signed)
Date of procedure: 07/30/22  Preoperative diagnosis:  BPH with obstruction  Postoperative diagnosis:  Same  Procedure: HoLEP (Holmium Laser Enucleation of the Prostate)  Surgeon: Nickolas Madrid, MD  Anesthesia: General  Complications: None  Intraoperative findings:  Large prostate with lateral lobe hypertrophy, moderate bladder trabeculations, no suspicious lesions Ureteral orifices and verumontanum intact at conclusion of case, excellent hemostasis  EBL: Minimal  Specimens: Prostate chips  Enucleation time: 34 minutes  Morcellation time: 15 minutes  Intra-op weight: 111g  Drains: 24 French three-way, 60 cc in balloon  Indication: Eric Chambers is a 76 y.o. patient with BPH with prostate measuring 130 g and significantly elevated PSA and obstructive symptoms.  Multiple biopsies were negative and prostate MRI showed no suspicious lesions and he opted for HOLEP.  After reviewing the management options for treatment, they elected to proceed with the above surgical procedure(s). We have discussed the potential benefits and risks of the procedure, side effects of the proposed treatment, the likelihood of the patient achieving the goals of the procedure, and any potential problems that might occur during the procedure or recuperation.  We specifically discussed the risks of bleeding, infection, hematuria and clot retention, need for additional procedures, possible overnight hospital stay, temporary urgency and incontinence, rare long-term incontinence, and retrograde ejaculation.  Informed consent has been obtained.   Description of procedure:  The patient was taken to the operating room and general anesthesia was induced.  The patient was placed in the dorsal lithotomy position, prepped and draped in the usual sterile fashion, and preoperative antibiotics(Ancef) were administered.  SCDs were placed for DVT prophylaxis.  A preoperative time-out was performed.   Eric Chambers sounds were  used to gently dilated the urethra up to 3F. The 60 French continuous flow resectoscope was inserted into the urethra using the visual obturator  The prostate was large with obstructing lateral lobes. The bladder was thoroughly inspected and noted for moderate trabeculations but no suspicious lesions.  The ureteral orifices were located in orthotopic position.  The laser was set to 2 J and 60 Hz and was used to make a lambda incision just proximal to the verumontanum down to the level of the capsule. Early apical release was performed by making a circumferential mucosal incision proximal to the sphincter.  The prostate was enucleated en bloc circumferentially into the bladder following the capsule plane.  The capsule was examined and laser was used for meticulous hemostasis.    The 40 French resectoscope was then switched out for the 29 French nephroscope and the lobes were morcellated and the tissue sent to pathology.  A 24 French three-way catheter was inserted easily with the aid of a catheter guide, and sick the cc were placed in the balloon.  Urine was light pink.  The catheter irrigated easily with a Toomey syringe.  CBI was initiated.   The patient tolerated the procedure well without any immediate complications and was extubated and transferred to the recovery room in stable condition.  Urine was clear on fast CBI.  Disposition: Stable to PACU  Plan: Wean CBI in PACU, anticipate discharge home today with Foley removal in clinic in 2-3 days  Nickolas Madrid, MD 07/30/2022

## 2022-07-30 NOTE — Anesthesia Postprocedure Evaluation (Signed)
Anesthesia Post Note  Patient: Eric Chambers  Procedure(s) Performed: HOLEP-LASER ENUCLEATION OF THE PROSTATE WITH MORCELLATION (Prostate)  Patient location during evaluation: PACU Anesthesia Type: General Level of consciousness: awake and alert Pain management: pain level controlled Vital Signs Assessment: post-procedure vital signs reviewed and stable Respiratory status: spontaneous breathing, nonlabored ventilation and respiratory function stable Cardiovascular status: blood pressure returned to baseline and stable Postop Assessment: no apparent nausea or vomiting Anesthetic complications: no   No notable events documented.   Last Vitals:  Vitals:   07/30/22 1115 07/30/22 1140  BP: (!) 139/59 (!) 126/56  Pulse: 64 68  Resp: 18 16  Temp:  (!) 36.2 C  SpO2: 97% 100%    Last Pain:  Vitals:   07/30/22 1140  TempSrc: Temporal  PainSc: [place]

## 2022-07-30 NOTE — Discharge Instructions (Addendum)

## 2022-08-02 ENCOUNTER — Ambulatory Visit (INDEPENDENT_AMBULATORY_CARE_PROVIDER_SITE_OTHER): Payer: Medicare Other | Admitting: Physician Assistant

## 2022-08-02 DIAGNOSIS — N401 Enlarged prostate with lower urinary tract symptoms: Secondary | ICD-10-CM

## 2022-08-02 DIAGNOSIS — N138 Other obstructive and reflux uropathy: Secondary | ICD-10-CM

## 2022-08-02 LAB — SURGICAL PATHOLOGY

## 2022-08-02 NOTE — Patient Instructions (Signed)

## 2022-08-02 NOTE — Progress Notes (Signed)
Catheter Removal  Patient is present today for a catheter removal.  74m of water was drained from the balloon. A 24FR three-way foley cath was removed from the bladder no complications were noted . Patient tolerated well.  Performed by: SDebroah Loop PA-C   Additional notes: Counseled patient on normal postoperative findings including dysuria, gross hematuria, and urinary urgency/leakage. Counseled patient to begin Kegel exercises 3x10 sets daily to increase urinary control and wear absorbent products as needed for security. Written and verbal resources provided today. Surgical pathology pending; will defer to Dr. SDiamantina Providenceto share results when available.   Follow up: Return in about 12 weeks (around 10/25/2022) for Postop f/u with Dr. SDiamantina Providencewith IPSS, PVR.

## 2022-08-06 ENCOUNTER — Telehealth: Payer: Self-pay

## 2022-08-06 ENCOUNTER — Ambulatory Visit: Payer: Medicare Other | Admitting: Physician Assistant

## 2022-08-06 NOTE — Telephone Encounter (Signed)
Patient calls triage line and states that he is still having dysuria. Advised patient per Sam that dysuria is normal during the first 10 days post op. Patient denies fever, chills, testicle swelling, and testicle pain. Advised patient to call back if no improvement in symptoms by next week. Advised to increase hydration. Pt voiced understanding.

## 2022-08-09 NOTE — Progress Notes (Deleted)
Synopsis: Referred for chronic cough by Lawerance Cruel, MD  Subjective:   PATIENT ID: Eric Chambers GENDER: male DOB: 1946/02/20, MRN: [id number]  No chief complaint on file.  76yM with AR, endocarditis, HTN, frequent pneumonia, OSA, GERD referred for cough - saw Dr. 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 Says he just has a hard time clearing chest colds when he gets them. Has had suspected cough-related syncope.  If he takes a double dose of tessalon perles it knocks out cough.   He has been on singulair for about a month. He's unsure if it's been helpful for his cough but his cough did improve over the course of the month that he's been on it.  He was having quite a bit of sinus congestion and postnasal drainage prior to starting   Interval HPI: Irrigation, flonase, singulair resumed last visit with plan for PFT today if he wasn't 100% better from cough standpoint.  Otherwise pertinent review of systems is negative.  Past Medical History:  Diagnosis Date   Aortic root dilatation ([place]) 04/08/2016   a.) TTE 04/08/2016: Ao root measured 39 mm; b.) TTE 01/12/2018: Ao root measured 38 mm; c.) TTE 06/20/2020: Ao root measured 40 mm, asc Ao 39 mm   Aortic valve regurgitation    Bilateral cataracts    a.) s/p extraction with IOL placement   Diastolic dysfunction    a.) TTE 04/08/2016: EF 55-60%, triv TR, mod AR, atrial septal aneurysm, G1DD; b.) TTE 01/12/2018: EF 60-65%, mild-mod AoV calcification, mod-sev AR, G1DD; c.) TTE 06/20/2020: EF 65-70%, sev LVH, mild AoV sclerosis with mod AR, triv MR; d.) TTE 03/02/2021: EF >55%, mild LVH, GLS -16.0%, MAC/TAC, trivial TR, moderate AR/PR.   Endocarditis 08/17/2013   Possible vegetation associated with tricuspid valve (seen on post tx echo as well)    Endocarditis 2009   a.) while living in [place]   Enlarged prostate    GERD (gastroesophageal reflux disease)    Heart murmur    "think I have one now"  (08/16/2013)   High cholesterol    Hypertension    PSVT (paroxysmal supraventricular tachycardia) ([place]) 01/18/2022   a.) Holter 01/18/2022: 16 SVT runs with fastest interval lasting 7 beats with a max rate of 222 bpm, longest lasted 17 beats at a rate of 125 bpm   Recurrent pneumonia    "almost once/yr" (08/16/2013)   Sleep apnea    "don't wear mask" (08/16/2013)   Squamous cell carcinoma of skin of scalp    Umbilical hernia    a.) s/p repair in approx 2011     Family History  Problem Relation Age of Onset   Hypertension Mother    Heart attack Father 39     Past Surgical History:  Procedure Laterality Date   CATARACT EXTRACTION W/ INTRAOCULAR LENS IMPLANT Right 07/05/2022   Procedure: CATARACT EXTRACTION W/ INTRAOCULAR LENS IMPLANT; Location: [facility]   CATARACT EXTRACTION W/ INTRAOCULAR LENS IMPLANT Left 07/19/2022   Procedure: CATARACT EXTRACTION W/ INTRAOCULAR LENS IMPLANT; Location: [facility]   HOLEP-LASER ENUCLEATION OF THE PROSTATE WITH MORCELLATION N/A 07/30/2022   Procedure: HOLEP-LASER ENUCLEATION OF THE PROSTATE WITH MORCELLATION;  Surgeon: Billey Co, MD;  Location: [facility];  Service: Urology;  Laterality: N/A;   INGUINAL HERNIA REPAIR Right ~ 1950   TEE WITHOUT CARDIOVERSION N/A 08/17/2013   Procedure: TRANSESOPHAGEAL ECHOCARDIOGRAM (TEE);  Surgeon: Candee Furbish, MD;  Location: [facility];  Service: Cardiovascular;  Laterality: N/A;  TONSILLECTOMY  ~ 2585   UMBILICAL HERNIA REPAIR  ~ 2011    Social History   Socioeconomic History   Marital status: Married    Spouse name: Not on file   Number of children: Not on file   Years of education: Not on file   Highest education level: Not on file  Occupational History   Not on file  Tobacco Use   Smoking status: Never    Passive exposure: Never   Smokeless tobacco: Never  Vaping Use   Vaping Use: Never used  Substance and Sexual Activity   Alcohol use: Yes    Alcohol/week: 2.0 standard drinks of alcohol    Types:  2 Glasses of wine per week    Comment: occasionally   Drug use: No   Sexual activity: Yes  Other Topics Concern   Not on file  Social History Narrative   Not on file   Social Determinants of Health   Financial Resource Strain: Not on file  Food Insecurity: Not on file  Transportation Needs: Not on file  Physical Activity: Not on file  Stress: Not on file  Social Connections: Not on file  Intimate Partner Violence: Not on file     Allergies  Allergen Reactions   Finasteride     Other reaction(s): felt bad, Other (See Comments)     Outpatient Medications Prior to Visit  Medication Sig Dispense Refill   montelukast (SINGULAIR) 10 MG tablet Take 10 mg by mouth at bedtime.     rosuvastatin (CRESTOR) 10 MG tablet Take 10 mg by mouth daily.     valsartan-hydrochlorothiazide (DIOVAN-HCT) 320-12.5 MG tablet Take 1 tablet by mouth daily.     No facility-administered medications prior to visit.       Objective:   Physical Exam:  General appearance: 76 y.o., male, NAD, conversant  Eyes: anicteric sclerae; PERRL, tracking appropriately HENT: NCAT; MMM +nasal polyps left nare Neck: Trachea midline; no lymphadenopathy, no JVD Lungs: CTAB, no crackles, no wheeze, with normal respiratory effort CV: RRR, no murmur  Abdomen: Soft, non-tender; non-distended, BS present  Extremities: No peripheral edema, warm Skin: Normal turgor and texture; no rash Psych: Appropriate affect Neuro: Alert and oriented to person and place, no focal deficit     There were no vitals filed for this visit.     on RA BMI Readings from Last 3 Encounters:  07/30/22 30.56 kg/m  07/22/22 30.56 kg/m  07/20/22 30.41 kg/m   Wt Readings from Last 3 Encounters:  07/30/22 200 lb 15.9 oz (91.2 kg)  07/22/22 201 lb (91.2 kg)  07/20/22 200 lb (90.7 kg)     CBC    Component Value Date/Time   WBC 9.1 05/26/2022 1427   RBC 4.51 05/26/2022 1427   HGB 12.9 (L) 07/30/2022 0853   HCT 38.0 (L)  07/30/2022 0853   PLT 198 05/26/2022 1427   MCV 89.8 05/26/2022 1427   MCH 30.2 05/26/2022 1427   MCHC 33.6 05/26/2022 1427   RDW 13.2 05/26/2022 1427   LYMPHSABS 1.5 11/13/2021 1814   MONOABS 0.3 11/13/2021 1814   EOSABS 0.1 11/13/2021 1814   BASOSABS 0.0 11/13/2021 1814    Eos 0-100 IgE 11 in 2019 with negative allergy panel  Chest Imaging: CXR 11/13/21 reviewed by me unremarkable  CXR 05/24/22 reviewed by me unremarkable  CXR 06/08/22 reviewed by me with possible R perihilar opacity but I think this looks pretty similar to priors  Pulmonary Functions Testing Results:     No  data to display             Assessment & Plan:   # Subacute on chronic cough: # CRS with nasal polyps left nare: # Postnasal drainage His subacute cough may represent lingering post viral cough. Has responded well to addition of singulair in past and it's actually well suited to his having nasal polyps although his postnasal drainage/sinus congestion doesn't sound very prominent today.  Plan: - resume nasal irrigation  - resume flonase 1 spray each nare after nasal irrigation - resume singulair 10 mg daily - continue this regimen whenever cough/postnasal drainage active for at least 8 weeks or until cough improvement, whichever is longer - may need to revisit gerd mgmt or ENT involvement if cough active next visit - PFT next visit unless he's 100% better - tessalon perles, dextromethorphan as needed    RTC 8 weeks   Maryjane Hurter, MD [facility] 08/09/2022 11:50 AM

## 2022-08-10 ENCOUNTER — Ambulatory Visit: Payer: Medicare Other | Admitting: Student

## 2022-09-01 ENCOUNTER — Telehealth: Payer: Self-pay | Admitting: Urology

## 2022-09-01 NOTE — Telephone Encounter (Signed)
Received an after hours nurse call report from 5am this morning.  Patient had HOLEP surgery with Dr. Diamantina Providence on 8/11 and woke up this morning unable to urinate.    Patient was advised by the after hours nurse to go to the ED - as of now, patient has not checked into a [facility] ED facility.  Left messages for the patient on [phone] and his wife, Neoma Laming at [phone] to call the office.  Patient needs to be seen in the clinic today with Dr. Diamantina Providence.

## 2022-09-01 NOTE — Telephone Encounter (Signed)
Patient returned the call and stated that he is feeling much better now.  He states that a few pieces of tissue came through his urethra and he is able to urinate now.    He will follow up as scheduled.

## 2022-09-21 ENCOUNTER — Telehealth: Payer: Self-pay

## 2022-09-21 DIAGNOSIS — R55 Syncope and collapse: Secondary | ICD-10-CM

## 2022-09-21 NOTE — Telephone Encounter (Signed)
Pt called and made aware that new Echocardiogram appointment date is 10/08/22 at 1035 am.    An appointment with Dr. Marlou Porch after 10/08/22; Pt scheduled for 10/14/22 at 220 pm with Dr. Marlou Porch, pt called and made aware of NEW appointment time.    Echocardiogram order entered. Dr. Marlou Porch RN made aware of scheduling, no f/u req.

## 2022-09-21 NOTE — Telephone Encounter (Signed)
Pt called front desk Eric Chambers stated he cannot make his 09/22/22 appointment for his Echocardiogram ordered by Dr. Marlou Porch.  Pt stated when called that he will be out of town, and will return the end of the week on around 10/19. Contacted [name], to make aware of Echo cancellation for 10/4, but asked to reschedule the Echo for the week of 10/11/22.   Pt appointment with Dr. Marlou Porch on 09/27/22 will also have to be moved, once the Echo day and time is know.  Follow up required.

## 2022-09-22 ENCOUNTER — Ambulatory Visit (HOSPITAL_COMMUNITY): Payer: Medicare Other

## 2022-09-27 ENCOUNTER — Ambulatory Visit: Payer: Medicare Other | Admitting: Cardiology

## 2022-10-08 ENCOUNTER — Ambulatory Visit (HOSPITAL_COMMUNITY): Payer: Medicare Other | Attending: Cardiology

## 2022-10-08 DIAGNOSIS — R054 Cough syncope: Secondary | ICD-10-CM | POA: Diagnosis present

## 2022-10-08 DIAGNOSIS — R55 Syncope and collapse: Secondary | ICD-10-CM | POA: Insufficient documentation

## 2022-10-08 LAB — ECHOCARDIOGRAM COMPLETE
Area-P 1/2: 3.08 cm2
P 1/2 time: 539 msec
S' Lateral: 2 cm

## 2022-10-12 ENCOUNTER — Encounter (HOSPITAL_COMMUNITY): Payer: Self-pay

## 2022-10-12 ENCOUNTER — Ambulatory Visit (HOSPITAL_COMMUNITY)
Admission: EM | Admit: 2022-10-12 | Discharge: 2022-10-12 | Disposition: A | Discharge status: Home / Self Care | Payer: Medicare Other

## 2022-10-12 DIAGNOSIS — H1131 Conjunctival hemorrhage, right eye: Secondary | ICD-10-CM | POA: Diagnosis not present

## 2022-10-12 NOTE — ED Provider Notes (Signed)
[place]    CSN: [id number] Arrival date & time: 10/12/22  1128      History   Chief Complaint Chief Complaint  Patient presents with   Eye Problem    HPI Eric Chambers is a 76 y.o. male.   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  Denies headache, nausea and vomiting after injury, falls, and syncope.  He does not use blood thinners and is currently without head pain.  His wife is very concerned that his eye continues to be very red.  Patient denies pain to the eye, swelling surrounding the eye, blurry vision, decreased visual acuity, drainage from the eye, left eye symptoms, headache, dizziness, ear pain, sore throat, chest pain, shortness of breath, and warmth surrounding the eye.  States "my eye does not bother me and it feels fine, just looks red". History of cataract surgery to bilateral eyes in July 2023 but has not had problems since. Wears glasses intermittently. Denies foreign body to the eye, photophobia, or scratching the eye, although it is mildly itchy. Has not attempted use of any over the counter medications prior to arrival at urgent care.    Eye Problem   Past Medical History:  Diagnosis Date   Aortic root dilatation ([place]) 04/08/2016   a.) TTE 04/08/2016: Ao root measured 39 mm; b.) TTE 01/12/2018: Ao root measured 38 mm; c.) TTE 06/20/2020: Ao root measured 40 mm, asc Ao 39 mm   Aortic valve regurgitation    Bilateral cataracts    a.) s/p extraction with IOL placement   Diastolic dysfunction    a.) TTE 04/08/2016: EF 55-60%, triv TR, mod AR, atrial septal aneurysm, G1DD; b.) TTE 01/12/2018: EF 60-65%, mild-mod AoV calcification, mod-sev AR, G1DD; c.) TTE 06/20/2020: EF 65-70%, sev LVH, mild AoV sclerosis with mod AR, triv MR; d.) TTE 03/02/2021: EF >55%, mild LVH, GLS  -16.0%, MAC/TAC, trivial TR, moderate AR/PR.   Endocarditis 08/17/2013   Possible vegetation associated with tricuspid valve (seen on post tx echo as well)    Endocarditis 2009   a.) while living in [place]   Enlarged prostate    GERD (gastroesophageal reflux disease)    Heart murmur    "think I have one now" (08/16/2013)   High cholesterol    Hypertension    PSVT (paroxysmal supraventricular tachycardia) 01/18/2022   a.) Holter 01/18/2022: 16 SVT runs with fastest interval lasting 7 beats with a max rate of 222 bpm, longest lasted 17 beats at a rate of 125 bpm   Recurrent pneumonia    "almost once/yr" (08/16/2013)   Sleep apnea    "don't wear mask" (08/16/2013)   Squamous cell carcinoma of skin of scalp    Umbilical hernia    a.) s/p repair in approx 2011    Patient Active Problem List   Diagnosis Date Noted   Cough syncope 10/12/2018   Cough variant asthma vs UACS with cough syncope  10/11/2018   Hypokalemia 08/29/2013   Anemia due to acute blood loss 08/29/2013   Diarrhea 08/29/2013   Vomiting 08/29/2013   OSA (obstructive sleep apnea) 08/16/2013   FUO (fever of unknown origin) 08/16/2013   Aortic regurgitation 08/16/2013    Class: Chronic   Pure hypercholesterolemia    Hypertension    Enlarged prostate    Endocarditis  Past Surgical History:  Procedure Laterality Date   CATARACT EXTRACTION W/ INTRAOCULAR LENS IMPLANT Right 07/05/2022   Procedure: CATARACT EXTRACTION W/ INTRAOCULAR LENS IMPLANT; Location: [facility]   CATARACT EXTRACTION W/ INTRAOCULAR LENS IMPLANT Left 07/19/2022   Procedure: CATARACT EXTRACTION W/ INTRAOCULAR LENS IMPLANT; Location: [facility]   HOLEP-LASER ENUCLEATION OF THE PROSTATE WITH MORCELLATION N/A 07/30/2022   Procedure: HOLEP-LASER ENUCLEATION OF THE PROSTATE WITH MORCELLATION;  Surgeon: Billey Co, MD;  Location: [facility] ORS;  Service: Urology;  Laterality: N/A;   INGUINAL HERNIA REPAIR Right ~ 1950   TEE WITHOUT CARDIOVERSION N/A 08/17/2013    Procedure: TRANSESOPHAGEAL ECHOCARDIOGRAM (TEE);  Surgeon: Candee Furbish, MD;  Location: [facility] ENDOSCOPY;  Service: Cardiovascular;  Laterality: N/A;   TONSILLECTOMY  ~ 8250   UMBILICAL HERNIA REPAIR  ~ 2011       Home Medications    Prior to Admission medications   Medication Sig Start Date End Date Taking? Authorizing Provider  montelukast (SINGULAIR) 10 MG tablet Take 10 mg by mouth at bedtime.   Yes [provider]  rosuvastatin (CRESTOR) 10 MG tablet Take 10 mg by mouth daily.   Yes [provider]  valsartan-hydrochlorothiazide (DIOVAN-HCT) 320-12.5 MG tablet Take 1 tablet by mouth daily.   Yes [provider]    Family History Family History  Problem Relation Age of Onset   Hypertension Mother    Heart attack Father 40    Social History Social History   Tobacco Use   Smoking status: Never    Passive exposure: Never   Smokeless tobacco: Never  Vaping Use   Vaping Use: Never used  Substance Use Topics   Alcohol use: Yes    Alcohol/week: 2.0 standard drinks of alcohol    Types: 2 Glasses of wine per week    Comment: occasionally   Drug use: No     Allergies   Finasteride   Review of Systems Review of Systems Per HPI  Physical Exam Triage Vital Signs ED Triage Vitals  Enc Vitals Group     BP 10/12/22 1308 (!) 127/50     Pulse Rate 10/12/22 1308 78     Resp 10/12/22 1308 16     Temp 10/12/22 1308 97.7 F (36.5 C)     Temp Source 10/12/22 1308 Oral     SpO2 10/12/22 1308 99 %     Weight --      Height --      Head Circumference --      Peak Flow --      Pain Score 10/12/22 1307 1     Pain Loc --      Pain Edu? --      Excl. in [place]? --    No data found.  Updated Vital Signs BP (!) 127/50 (BP Location: Right Wrist)   Pulse 78   Temp 97.7 F (36.5 C) (Oral)   Resp 16   SpO2 99%   Visual Acuity Right Eye Distance:   Left Eye Distance:   Bilateral Distance:    Right Eye Near:   Left Eye Near:    Bilateral  Near:     Physical Exam Vitals and nursing note reviewed.  Constitutional:      Appearance: He is not ill-appearing or toxic-appearing.  HENT:     Head: Normocephalic and atraumatic.     Right Ear: Hearing and external ear normal.     Left Ear: Hearing and external ear normal.     Nose: Nose normal.  Mouth/Throat:     Lips: Pink.  Eyes:     General: Lids are normal. Vision grossly intact. Gaze aligned appropriately.     Extraocular Movements: Extraocular movements intact.     Conjunctiva/sclera:     Right eye: Right conjunctiva is injected. Hemorrhage present. No chemosis or exudate.    Left eye: Left conjunctiva is not injected. No chemosis, exudate or hemorrhage.    Comments: EOMs are intact without dizziness or pain elicited. No hyphema.  Pulmonary:     Effort: Pulmonary effort is normal.  Musculoskeletal:     Cervical back: Neck supple.  Skin:    General: Skin is warm and dry.     Capillary Refill: Capillary refill takes less than 2 seconds.     Findings: No rash.  Neurological:     General: No focal deficit present.     Mental Status: He is alert and oriented to person, place, and time. Mental status is at baseline.     Cranial Nerves: No dysarthria or facial asymmetry.  Psychiatric:        Mood and Affect: Mood normal.        Speech: Speech normal.        Behavior: Behavior normal.        Thought Content: Thought content normal.        Judgment: Judgment normal.      UC Treatments / Results  Labs (all labs ordered are listed, but only abnormal results are displayed) Labs Reviewed - No data to display  EKG   Radiology No results found.  Procedures Procedures (including critical care time)  Medications Ordered in UC Medications - No data to display  Initial Impression / Assessment and Plan / UC Course  I have reviewed the triage vital signs and the nursing notes.  Pertinent labs & imaging results that were available during my care of the patient  were reviewed by me and considered in my medical decision making (see chart for details).   1.  Subconjunctival hemorrhage of right eye Eye exam is stable. Low suspicion for corneal abrasion and therefore deferred fluorescein staining exam as patient is not experiencing pain to the right eye or drainage.  I does not appear infected today.  Hemorrhage will likely resolve in the next couple of weeks, patient advised to call ophthalmologist to schedule an appointment for further evaluation if symptoms fail to improve. Patient expresses agreement with plan.  Discussed physical exam and available lab work findings in clinic with patient.  Counseled patient regarding appropriate use of medications and potential side effects for all medications recommended or prescribed today. Discussed red flag signs and symptoms of worsening condition,when to call the PCP office, return to urgent care, and when to seek higher level of care in the emergency department. Patient verbalizes understanding and agreement with plan. All questions answered. Patient discharged in stable condition.    Final Clinical Impressions(s) / UC Diagnoses   Final diagnoses:  Subconjunctival hemorrhage of right eye     Discharge Instructions      Eye appears stable today. Call eye doctor to schedule a follow-up appointment for a couple of weeks from now, you may cancel this appointment within 48 hours of the appointment if your symptoms improve and you are no longer in need of specialist care.  If you develop any new or worsening eye pain, fever/chills, vision changes, or headache, please return or go to the ER for further evaluation.      ED Prescriptions  None    PDMP not reviewed this encounter.   Talbot Grumbling, [place] 10/12/22 [phone]

## 2022-10-12 NOTE — ED Triage Notes (Signed)
Pain in the right eye and redness for a few days. No trauma to the eye, unsure what happened. States has been around his 2 grand children but they were not sick.  No contacts or eye drop use.

## 2022-10-12 NOTE — Discharge Instructions (Addendum)
Eye appears stable today. Call eye doctor to schedule a follow-up appointment for a couple of weeks from now, you may cancel this appointment within 48 hours of the appointment if your symptoms improve and you are no longer in need of specialist care.  If you develop any new or worsening eye pain, fever/chills, vision changes, or headache, please return or go to the ER for further evaluation.

## 2022-10-13 ENCOUNTER — Ambulatory Visit: Payer: Medicare Other | Admitting: Urology

## 2022-10-14 ENCOUNTER — Ambulatory Visit: Payer: Medicare Other | Attending: Cardiology | Admitting: Cardiology

## 2022-10-14 ENCOUNTER — Encounter: Payer: Self-pay | Admitting: Cardiology

## 2022-10-14 VITALS — BP 110/50 | HR 72 | Ht 68.0 in | Wt 199.0 lb

## 2022-10-14 DIAGNOSIS — E7800 Pure hypercholesterolemia, unspecified: Secondary | ICD-10-CM | POA: Insufficient documentation

## 2022-10-14 DIAGNOSIS — I2510 Atherosclerotic heart disease of native coronary artery without angina pectoris: Secondary | ICD-10-CM

## 2022-10-14 DIAGNOSIS — I351 Nonrheumatic aortic (valve) insufficiency: Secondary | ICD-10-CM | POA: Diagnosis present

## 2022-10-14 DIAGNOSIS — I10 Essential (primary) hypertension: Secondary | ICD-10-CM | POA: Insufficient documentation

## 2022-10-14 NOTE — Patient Instructions (Addendum)
Medication Instructions:  The current medical regimen is effective;  continue present plan and medications.  *If you need a refill on your cardiac medications before your next appointment, please call your pharmacy*  Testing/Procedures: Your physician has requested that you have an echocardiogram in 1 year. Echocardiography is a painless test that uses sound waves to create images of your heart. It provides your doctor with information about the size and shape of your heart and how well your heart's chambers and valves are working. This procedure takes approximately one hour. There are no restrictions for this procedure. Please do NOT wear cologne, perfume, aftershave, or lotions (deodorant is allowed). Please arrive 15 minutes prior to your appointment time.  Follow-Up: At [facility], you and your health needs are our priority.  As part of our continuing mission to provide you with exceptional heart care, we have created designated Provider Care Teams.  These Care Teams include your primary Cardiologist (physician) and Advanced Practice Providers (APPs -  Physician Assistants and Nurse Practitioners) who all work together to provide you with the care you need, when you need it.  We recommend signing up for the patient portal called "MyChart".  Sign up information is provided on this After Visit Summary.  MyChart is used to connect with patients for Virtual Visits (Telemedicine).  Patients are able to view lab/test results, encounter notes, upcoming appointments, etc.  Non-urgent messages can be sent to your provider as well.   To learn more about what you can do with MyChart, go to [email or web address].    Your next appointment:   6 months  The format for your next appointment:   In Person  Provider:   Candee Furbish, MD       Important Information About Sugar

## 2022-10-14 NOTE — Progress Notes (Signed)
Cardiology Office Note:    Date:  10/14/2022   ID:  Eric Chambers, DOB 04-Aug-1946, MRN [id number]  PCP:  Lawerance Cruel, MD   [place] Prov[facility] Cardiologist:  Candee Furbish, MD     Referring MD: Lawerance Cruel, MD    History of Present Illness:    Eric Chambers is a 76 y.o. male here for the follow-up of his recent echocardiogram which demonstrates moderate aortic regurgitation, fairly stable with normal left ventricular dimensions and ejection fraction.  He has had aortic regurgitation for several years after bout of endocarditis in [place].  At his most recent appointment on 07/22/2022 he stated he has been feeling well and that he is able to climb stairs without too much shortness of breath. He reported he had pneumonia about a month before with a routine cough. No recurrent syncopal episodes. In the past he had passed out due to a rapid cough. Since taking valsartan, his at home blood pressures were averaging in the 275'T systolic or less. He recalled taking his blood pressures in the morning and getting numbers in the 140's while his body was becoming accustomed to the valsartan. He was very pleased with his recent lower readings. He was staying active.  Previously here for evaluation of recent cough related syncopal episode.  Had a coughing spell, fell face forward into table, facial ecchymosis.  He states that in 2018 he had a spell similar to this.  He also has a prior history of endocarditis in [place] and again here in 2014 treated with IV antibiotics..  Has moderate aortic valve regurgitation.  He does not have any significant aortic stenosis.  There was a TEE performed here in 2014 which I personally reviewed demonstrating a trileaflet aortic valve.  There is not appear to be any fusion of any leaflet segments.  His moderate regurgitation has been stable over the past several years.  Previously had a discussion with Dr. Cyndia Bent in 2019 regarding potential  timing of surgery and agree that we are not at that point yet due to lack of symptoms.  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    During a prior appointment on 03/23/2022, he was feeling very good. We reviewed his monitor results in detail. He complained of feeling more stiff, unable to lift as much weight as he used to. In December 2022 his LDL was 81, on 10 mg rosuvastatin. Originally on lipitor 20 mg, but this caused LE muscle cramps in his calves.  We see his wife Eric Chambers as well; she is followed for atrial fibrillation.  Today, the patient states that he has been feeling good recently and has been staying active. He has been running on the treadmill for up to a half an hour and has been consistently elevating his heart rate over 100 bpm with exercise. He has been able to play with his grandchildren with no restrictions. He gets stiff after sitting in the chair too often, but he feels good with walking. He also walks around the house and does yard work.  He tends to get pneumonia easily along with chest colds but doesn't notice any cardiac complications.  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  no vision issues.  He denies any palpitations, chest pain, shortness of breath, or peripheral edema. No lightheadedness, headaches, syncope, orthopnea, or PND.  He is planning on traveling a lot soon, to [place].  Past Medical History:  Diagnosis Date   Aortic root dilatation ([place]) 04/08/2016   a.) TTE 04/08/2016: Ao root measured 39 mm; b.) TTE 01/12/2018: Ao root measured 38 mm; c.) TTE 06/20/2020: Ao root measured 40 mm, asc Ao 39 mm   Aortic valve regurgitation    Bilateral cataracts    a.) s/p extraction with IOL placement   Diastolic dysfunction    a.) TTE 04/08/2016: EF 55-60%, triv TR, mod AR, atrial septal aneurysm, G1DD; b.) TTE 01/12/2018: EF 60-65%, mild-mod AoV calcification, mod-sev AR, G1DD; c.) TTE  06/20/2020: EF 65-70%, sev LVH, mild AoV sclerosis with mod AR, triv MR; d.) TTE 03/02/2021: EF >55%, mild LVH, GLS -16.0%, MAC/TAC, trivial TR, moderate AR/PR.   Endocarditis 08/17/2013   Possible vegetation associated with tricuspid valve (seen on post tx echo as well)    Endocarditis 2009   a.) while living in [place]   Enlarged prostate    GERD (gastroesophageal reflux disease)    Heart murmur    "think I have one now" (08/16/2013)   High cholesterol    Hypertension    PSVT (paroxysmal supraventricular tachycardia) 01/18/2022   a.) Holter 01/18/2022: 16 SVT runs with fastest interval lasting 7 beats with a max rate of 222 bpm, longest lasted 17 beats at a rate of 125 bpm   Recurrent pneumonia    "almost once/yr" (08/16/2013)   Sleep apnea    "don't wear mask" (08/16/2013)   Squamous cell carcinoma of skin of scalp    Umbilical hernia    a.) s/p repair in approx 2011    Past Surgical History:  Procedure Laterality Date   CATARACT EXTRACTION W/ INTRAOCULAR LENS IMPLANT Right 07/05/2022   Procedure: CATARACT EXTRACTION W/ INTRAOCULAR LENS IMPLANT; Location: [facility]   CATARACT EXTRACTION W/ INTRAOCULAR LENS IMPLANT Left 07/19/2022   Procedure: CATARACT EXTRACTION W/ INTRAOCULAR LENS IMPLANT; Location: [facility]   HOLEP-LASER ENUCLEATION OF THE PROSTATE WITH MORCELLATION N/A 07/30/2022   Procedure: HOLEP-LASER ENUCLEATION OF THE PROSTATE WITH MORCELLATION;  Surgeon: Billey Co, MD;  Location: [facility] ORS;  Service: Urology;  Laterality: N/A;   INGUINAL HERNIA REPAIR Right ~ 1950   TEE WITHOUT CARDIOVERSION N/A 08/17/2013   Procedure: TRANSESOPHAGEAL ECHOCARDIOGRAM (TEE);  Surgeon: Candee Furbish, MD;  Location[facility] END[facility]COPY;  Service: Cardiovascular;  Laterality: N/A;   TONSILLECTOMY  ~ 2706   UMBILICAL HERNIA REPAIR  ~ 2011    Current Medications: Current Meds  Medication Sig   montelukast (SINGULAIR) 10 MG tablet Take 10 mg by mouth at bedtime.   rosuvastatin (CRESTOR) 10 MG tablet  Take 10 mg by mouth daily.   valsartan-hydrochlorothiazide (DIOVAN-HCT) 320-12.5 MG tablet Take 1 tablet by mouth daily.     Allergies:   Finasteride   Social History   Socioeconomic History   Marital status: Married    Spouse name: Not on file   Number of children: Not on file   Years of education: Not on file   Highest education level: Not on file  Occupational History   Not on file  Tobacco Use   Smoking status: Never    Passive exposure: Never   Smokeless tobacco: Never  Vaping Use   Vaping Use: Never used  Substance and Sexual Activity   Alcohol use: Yes    Alcohol/week: 2.0 standard drinks of alcohol    Types: 2 Glasses of wine per week    Comment: occasionally   Drug use: No   Sexual activity: Yes  Other Topics Concern   Not  on file  Social History Narrative   Not on file   Social Determinants of Health   Financial Resource Strain: Not on file  Food Insecurity: Not on file  Transportation Needs: Not on file  Physical Activity: Not on file  Stress: Not on file  Social Connections: Not on file     Family History: The patient's family history includes Heart attack (age of onset: 11) in his father; Hypertension in his mother.  ROS:   Please see the history of present illness.  All other systems are reviewed and negative.    EKGs/Labs/Other Studies Reviewed:    The following studies were reviewed today:  Echo 10/08/2022:   1. Moderate to severe AI; suggest TEE to further assess.   2. Left ventricular ejection fraction, by estimation, is 60 to 65%. The  left ventricle has normal function. The left ventricle has no regional  wall motion abnormalities. There is mild concentric left ventricular  hypertrophy. Left ventricular diastolic  parameters are consistent with Grade II diastolic dysfunction  (pseudonormalization).   3. Right ventricular systolic function is normal. The right ventricular  size is normal.   4. The mitral valve is normal in  structure. No evidence of mitral valve  regurgitation. No evidence of mitral stenosis.   5. The aortic valve is tricuspid. Aortic valve regurgitation is moderate  to severe. Aortic valve sclerosis/calcification is present, without any  evidence of aortic stenosis.   6. Pulmonic valve regurgitation is moderate.   7. There is mild dilatation of the ascending aorta, measuring 40 mm.   8. The inferior vena cava is normal in size with greater than 50%  respiratory variability, suggesting right atrial pressure of 3 mmHg.   Monitor 01/2022: Sinus rhythm with average heart rate of 76 bpm Brief episodes of atrial tachycardia, longest 17 beats with average heart rate of 125 beats minute. No atrial fibrillation, no pauses, no adverse arrhythmias. Rare PVCs and PACs. Overall reassuring monitor in the setting of recent syncope.     Patch Wear Time:  13 days and 19 hours (2023-01-10T11:08:42-0500 to 2023-01-24T07:03:47-0500)   Patient had a min HR of 55 bpm, max HR of 222 bpm, and avg HR of 76 bpm. Predominant underlying rhythm was Sinus Rhythm. 16 Supraventricular Tachycardia runs occurred, the run with the fastest interval lasting 7 beats with a max rate of 222 bpm, the  longest lasting 17 beats with an avg rate of 125 bpm. Isolated SVEs were rare (<1.0%), SVE Couplets were rare (<1.0%), and SVE Triplets were rare (<1.0%). Isolated VEs were rare (<1.0%), VE Couplets were rare (<1.0%), and no VE Triplets were present.   ECHO [facility] 02/2021: Echo complete  Result Value Ref Range  LV Ejection Fraction (%) 55 %  Right Ventricle Systolic Pressure (mmHg) 16 mmHg  Left Atrium Diameter (cm) 3.7 cm  LV End Diastolic Diameter (cm) 4.4 cm  LV End Systolic Diameter (cm) 1.9 cm  LV Septum Wall Thickness (cm) 1.3 cm  LV Posterior Wall Thickness (cm) 0.9 cm  Tricuspid Valve Regurgitation Grade trivial  Tricuspid Valve Regurgitation Max Velocity (m/s) 1.8 m/s  Mitral Valve Regurgitation Grade none  Mitral Valve  Stenosis Grade none  Aortic Valve Regurgitation Grade moderate  Aortic Valve Stenosis Grade none   EKG:  EKG is personally reviewed. 10/14/2022: EKG is not ordered 07/22/22: Sinus rhythm. Rate 68 bpm. Left axis deviation   03/23/2022: EKG was not ordered. 12/29/2021: SR 65, borderline first-degree AV block with PR interval of 200 ms.  Recent Labs: 11/13/2021: ALT 18 05/26/2022: Platelets 198 07/30/2022: BUN 20; Creatinine, Ser 1.30; Hemoglobin 12.9; Potassium 3.8; Sodium 140  Recent Lipid Panel    Component Value Date/Time   CHOL 157 01/15/2014 0803   TRIG 126.0 01/15/2014 0803   HDL 41.60 01/15/2014 0803   CHOLHDL 4 01/15/2014 0803   VLDL 25.2 01/15/2014 0803   LDLCALC 90 01/15/2014 0803   LDLDIRECT 161.0 10/09/2013 1050     Risk Assessment/Calculations:              Physical Exam:    VS:  BP (!) 110/50 (BP Location: Left Arm, Patient Position: Sitting, Cuff Size: Normal)   Pulse 72   Ht _0  (1.727 m)   Wt 199 lb (90.3 kg)   SpO2 97%   BMI 30.26 kg/m     Wt Readings from Last 3 Encounters:  10/14/22 199 lb (90.3 kg)  07/30/22 200 lb 15.9 oz (91.2 kg)  07/22/22 201 lb (91.2 kg)     GEN:  Well nourished, well developed in no acute distress HEENT: Normal NECK: No JVD; No carotid bruits LYMPHATICS: No lymphadenopathy CARDIAC: RRR, 1/6 diastolic murmur, no rubs, no gallops RESPIRATORY:  Clear to auscultation without rales, wheezing or rhonchi  ABDOMEN: Soft, non-tender, non-distended MUSCULOSKELETAL:  No edema; No deformity  SKIN: Warm and dry NEUROLOGIC:  Alert and oriented x 3 PSYCHIATRIC:  Normal affect   ASSESSMENT:    1. Nonrheumatic aortic valve insufficiency   2. Pure hypercholesterolemia   3. Primary hypertension       PLAN:    In order of problems listed above:  Aortic regurgitation Moderate aortic valve regurgitation.  Currently asymptomatic.  Continue close surveillance.  At this time his left ventricular dimensions are normal. No  indications for surgery at this time.  We will continue to monitor.  Personally reviewed ultrasound.  No significant change from prior.  He is exercising at the [facility] 3 days a week.  Treadmill.  Doing well.   Endocarditis Prior aortic valve endocarditis.  Aortic valve.  Dental prophylaxis.   Pure hypercholesterolemia LDL 81 HDL 43 total cholesterol 145 triglycerides 120 from outside labs, ALT 27.  Excellent.  Continue with Crestor 10 mg once a day.  Continue.  No myalgias.  He had some trouble previously with atorvastatin.  No changes made.   OSA (obstructive sleep apnea) Currently doing well with treatment.  Excellent.  Hypertension Valsartan hydrochlorothiazide 320-12.5.  Continue.   Follow-up: 6 months.  Medication Adjustments/Labs and Tests Ordered: Current medicines are reviewed at length with the patient today.  Concerns regarding medicines are outlined above.  Orders Placed This Encounter  Procedures   ECHOCARDIOGRAM COMPLETE   No orders of the defined types were placed in this encounter.  Patient Instructions  Medication Instructions:  The current medical regimen is effective;  continue present plan and medications.  *If you need a refill on your cardiac medications before your next appointment, please call your pharmacy*  Testing/Procedures: Your physician has requested that you have an echocardiogram in 1 year. Echocardiography is a painless test that uses sound waves to create images of your heart. It provides your doctor with information about the size and shape of your heart and how well your heart's chambers and valves are working. This procedure takes approximately one hour. There are no restrictions for this procedure. Please do NOT wear cologne, perfume, aftershave, or lotions (deodorant is allowed). Please arrive 15 minutes prior to your appointment time.  Follow-Up: At [facility], you and your health needs are our priority.  As part of our continuing  mission to provide you with exceptional heart care, we have created designated Provider Care Teams.  These Care Teams include your primary Cardiologist (physician) and Advanced Practice Providers (APPs -  Physician Assistants and Nurse Practitioners) who all work together to provide you with the care you need, when you need it.  We recommend signing up for the patient portal called "MyChart".  Sign up information is provided on this After Visit Summary.  MyChart is used to connect with patients for Virtual Visits (Telemedicine).  Patients are able to view lab/test results, encounter notes, upcoming appointments, etc.  Non-urgent messages can be sent to your provider as well.   To learn more about what you can do with MyChart, go to [email or web address].    Your next appointment:   6 months  The format for your next appointment:   In Person  Provider:   Candee Furbish, MD       Important Information About Sugar         I,Coren O'Brien,acting as a scribe for Candee Furbish, MD.,have documented all relevant documentation on the behalf of Candee Furbish, MD,as directed by  Candee Furbish, MD while in the presence of Candee Furbish, MD.  I, Candee Furbish, MD, have reviewed all documentation for this visit. The documentation on 10/14/22 for the exam, diagnosis, procedures, and orders are all accurate and complete.   Signed, Candee Furbish, MD  10/14/2022 3:27 PM    [place]

## 2022-11-24 ENCOUNTER — Ambulatory Visit (INDEPENDENT_AMBULATORY_CARE_PROVIDER_SITE_OTHER): Payer: Medicare Other

## 2022-11-24 ENCOUNTER — Encounter (HOSPITAL_COMMUNITY): Payer: Self-pay

## 2022-11-24 ENCOUNTER — Ambulatory Visit (HOSPITAL_COMMUNITY)
Admission: EM | Admit: 2022-11-24 | Discharge: 2022-11-24 | Disposition: A | Discharge status: Home / Self Care | Payer: Medicare Other | Attending: Family Medicine | Admitting: Family Medicine

## 2022-11-24 DIAGNOSIS — R062 Wheezing: Secondary | ICD-10-CM | POA: Diagnosis not present

## 2022-11-24 DIAGNOSIS — J069 Acute upper respiratory infection, unspecified: Secondary | ICD-10-CM

## 2022-11-24 DIAGNOSIS — R0981 Nasal congestion: Secondary | ICD-10-CM

## 2022-11-24 DIAGNOSIS — R059 Cough, unspecified: Secondary | ICD-10-CM

## 2022-11-24 MED ORDER — PREDNISONE 20 MG PO TABS: 40.0000 mg | ORAL_TABLET | Freq: Every day | ORAL | 0 refills | Status: DC

## 2022-11-24 MED ORDER — HYDROCODONE BIT-HOMATROP MBR 5-1.5 MG/5ML PO SOLN: 5.0000 mL | Freq: Four times a day (QID) | ORAL | 0 refills | Status: DC | PRN

## 2022-11-24 NOTE — ED Provider Notes (Signed)
[place]   [id number] 11/24/22 Arrival Time: 8182  ASSESSMENT & PLAN:  1. Viral URI with cough   2. Wheezing    I have personally viewed the imaging studies ordered this visit. Normal CXR. No PNA.  No respiratory distress. Discussed typical duration of likely viral illness. Cough bothering him the most; affecting sleep.  Discharge Medication List as of 11/24/2022  3:42 PM     START taking these medications   Details  HYDROcodone bit-homatropine (HYCODAN) 5-1.5 MG/5ML syrup Take 5 mLs by mouth every 6 (six) hours as needed for cough., Starting Wed 11/24/2022, Normal    predniSONE (DELTASONE) 20 MG tablet Take 2 tablets (40 mg total) by mouth daily., Starting Wed 11/24/2022, Normal       May try OTC Claritin daily in addition to above.   Follow-up Information     [facility].   Specialty: Urgent Care Why: If worsening or failing to improve as anticipated. Contact information: [place] [address] [phone]        Lawerance Cruel, MD.   Specialty: Family Medicine Why: As needed. Contact information: [place] [address] [phone]                 Reviewed expectations re: course of current medical issues. Questions answered. Outlined signs and symptoms indicating need for more acute intervention. Understanding verbalized. After Visit Summary given.   SUBJECTIVE: History from: Patient. Eric Chambers is a 76 y.o. male. Reports: cough, nasal congestion, post-nasal drainage; decreased appetite; gradual onset; x 6-7 days; no SOB/CP. Denies: fever. Normal PO intake without n/v/d. Cough is affecting sleep.  OBJECTIVE:  Vitals:   11/24/22 1454  BP: (!) 139/51  Pulse: 85  Resp: 16  Temp: 98 F (36.7 C)  TempSrc: Oral  SpO2: 96%    General appearance: alert; no distress Eyes: PERRLA; EOMI; conjunctiva normal HENT: [place]; AT; with nasal  congestion Neck: supple  Lungs: speaks full sentences without difficulty; unlabored; mild bilateral exp wheezing that clears with deep breaths Extremities: no edema Skin: warm and dry Neurologic: normal gait Psychological: alert and cooperative; normal mood and affect  Labs: Results for orders placed or performed in visit on 10/08/22  ECHOCARDIOGRAM COMPLETE  Result Value Ref Range   Area-P 1/2 3.08 cm2   S' Lateral 2.00 cm   P 1/2 time 539 msec   Labs Reviewed - No data to display  Imaging: DG Chest 2 View  Result Date: 11/24/2022 CLINICAL DATA:  Cough, nasal congestion, nasal drainage, loss of appetite. EXAM: CHEST - 2 VIEW COMPARISON:  06/08/2022 FINDINGS: Heart size and mediastinal contours appear normal. No pleural effusion or edema. No airspace opacities identified. The visualized osseous structures are unremarkable. IMPRESSION: No active cardiopulmonary abnormalities. Electronically Signed   By: Kerby Moors M.D.   On: 11/24/2022 15:16    Allergies  Allergen Reactions   Finasteride     Other reaction(s): felt bad, Other (See Comments)    Past Medical History:  Diagnosis Date   Aortic root dilatation ([place]) 04/08/2016   a.) TTE 04/08/2016: Ao root measured 39 mm; b.) TTE 01/12/2018: Ao root measured 38 mm; c.) TTE 06/20/2020: Ao root measured 40 mm, asc Ao 39 mm   Aortic valve regurgitation    Bilateral cataracts    a.) s/p extraction with IOL placement   Diastolic dysfunction    a.) TTE 04/08/2016: EF 55-60%, triv TR, mod AR, atrial septal aneurysm,  G1DD; b.) TTE 01/12/2018: EF 60-65%, mild-mod AoV calcification, mod-sev AR, G1DD; c.) TTE 06/20/2020: EF 65-70%, sev LVH, mild AoV sclerosis with mod AR, triv MR; d.) TTE 03/02/2021: EF >55%, mild LVH, GLS -16.0%, MAC/TAC, trivial TR, moderate AR/PR.   Endocarditis 08/17/2013   Possible vegetation associated with tricuspid valve (seen on post tx echo as well)    Endocarditis 2009   a.) while living in [place]    Enlarged prostate    GERD (gastroesophageal reflux disease)    Heart murmur    "think I have one now" (08/16/2013)   High cholesterol    Hypertension    PSVT (paroxysmal supraventricular tachycardia) 01/18/2022   a.) Holter 01/18/2022: 16 SVT runs with fastest interval lasting 7 beats with a max rate of 222 bpm, longest lasted 17 beats at a rate of 125 bpm   Recurrent pneumonia    "almost once/yr" (08/16/2013)   Sleep apnea    "don't wear mask" (08/16/2013)   Squamous cell carcinoma of skin of scalp    Umbilical hernia    a.) s/p repair in approx 2011   Social History   Socioeconomic History   Marital status: Married    Spouse name: Not on file   Number of children: Not on file   Years of education: Not on file   Highest education level: Not on file  Occupational History   Not on file  Tobacco Use   Smoking status: Never    Passive exposure: Never   Smokeless tobacco: Never  Vaping Use   Vaping Use: Never used  Substance and Sexual Activity   Alcohol use: Yes    Alcohol/week: 2.0 standard drinks of alcohol    Types: 2 Glasses of wine per week    Comment: occasionally   Drug use: No   Sexual activity: Yes  Other Topics Concern   Not on file  Social History Narrative   Not on file   Social Determinants of Health   Financial Resource Strain: Not on file  Food Insecurity: Not on file  Transportation Needs: Not on file  Physical Activity: Not on file  Stress: Not on file  Social Connections: Not on file  Intimate Partner Violence: Not on file   Family History  Problem Relation Age of Onset   Hypertension Mother    Heart attack Father 16   Past Surgical History:  Procedure Laterality Date   CATARACT EXTRACTION W/ INTRAOCULAR LENS IMPLANT Right 07/05/2022   Procedure: CATARACT EXTRACTION W/ INTRAOCULAR LENS IMPLANT; Location: [facility]   CATARACT EXTRACTION W/ INTRAOCULAR LENS IMPLANT Left 07/19/2022   Procedure: CATARACT EXTRACTION W/ INTRAOCULAR LENS IMPLANT;  Location: [facility]   HOLEP-LASER ENUCLEATION OF THE PROSTATE WITH MORCELLATION N/A 07/30/2022   Procedure: HOLEP-LASER ENUCLEATION OF THE PROSTATE WITH MORCELLATION;  Surgeon: Billey Co, MD;  Location: [facility] ORS;  Service: Urology;  Laterality: N/A;   INGUINAL HERNIA REPAIR Right ~ 1950   TEE WITHOUT CARDIOVERSION N/A 08/17/2013   Procedure: TRANSESOPHAGEAL ECHOCARDIOGRAM (TEE);  Surgeon: Candee Furbish, MD;  Location: [facility] ENDOSCOPY;  Service: Cardiovascular;  Laterality: N/A;   TONSILLECTOMY  ~ 4982   UMBILICAL HERNIA REPAIR  ~ 2011     Vanessa Kick, MD 11/24/22 [phone]

## 2022-11-24 NOTE — ED Triage Notes (Signed)
Pt is here for cough, nasal congestion, nasal drainage loss of appetite , cough keeping pt up at [id number]

## 2022-11-24 NOTE — Discharge Instructions (Signed)
Be aware, your cough medication may cause drowsiness. Please do not drive, operate heavy machinery or make important decisions while on this medication as it may cloud your judgement.

## 2023-01-01 NOTE — Progress Notes (Signed)
Synopsis: Referred for chronic cough by Lawerance Cruel, MD  Subjective:   PATIENT ID: Eric Chambers GENDER: male DOB: 05-Nov-1946, MRN: [id number]  Chief Complaint  Patient presents with   Acute Visit    Pt c/o increased cough since Oct 2023- prod with white sputum. He occ has runny nose. He is currently on zpack. Cough tends to be worse when he lies down.    41yM with AR, endocarditis, HTN, frequent pneumonia, OSA, GERD referred for cough - saw Dr. 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 Says he just has a hard time clearing chest colds when he gets them. Has had suspected cough-related syncope.  If he takes a double dose of tessalon perles it knocks out cough.   He has been on singulair for about a month. He's unsure if it's been helpful for his cough but his cough did improve over the course of the month that he's been on it.  He was having quite a bit of sinus congestion and postnasal drainage prior to starting   Interval HPI:  Resumed flonase, nasal irrigation, singulair last visi, PFTs ordered  Seen in UC 11/24/22 for viral URI and cough given prednisone, hycodan  Was on amoxicillin 3 weeks ago. Went on azithromycin a day ago. Cough has pretty much persisted since trip to urgent care. He does still have sinus congestion, postnasal drainage. No fever.   Only tried flonase for a day or two. Didn't try nasal irrigation. Stopped singulair two weeks ago. No overt heartburn unless he eats a big steak before bed.   Otherwise pertinent review of systems is negative.  Past Medical History:  Diagnosis Date   Aortic root dilatation ([place]) 04/08/2016   a.) TTE 04/08/2016: Ao root measured 39 mm; b.) TTE 01/12/2018: Ao root measured 38 mm; c.) TTE 06/20/2020: Ao root measured 40 mm, asc Ao 39 mm   Aortic valve regurgitation    Bilateral cataracts    a.) s/p extraction with IOL placement   Diastolic dysfunction    a.) TTE 04/08/2016: EF 55-60%, triv TR, mod  AR, atrial septal aneurysm, G1DD; b.) TTE 01/12/2018: EF 60-65%, mild-mod AoV calcification, mod-sev AR, G1DD; c.) TTE 06/20/2020: EF 65-70%, sev LVH, mild AoV sclerosis with mod AR, triv MR; d.) TTE 03/02/2021: EF >55%, mild LVH, GLS -16.0%, MAC/TAC, trivial TR, moderate AR/PR.   Endocarditis 08/17/2013   Possible vegetation associated with tricuspid valve (seen on post tx echo as well)    Endocarditis 2009   a.) while living in [place]   Enlarged prostate    GERD (gastroesophageal reflux disease)    Heart murmur    "think I have one now" (08/16/2013)   High cholesterol    Hypertension    PSVT (paroxysmal supraventricular tachycardia) 01/18/2022   a.) Holter 01/18/2022: 16 SVT runs with fastest interval lasting 7 beats with a max rate of 222 bpm, longest lasted 17 beats at a rate of 125 bpm   Recurrent pneumonia    "almost once/yr" (08/16/2013)   Sleep apnea    "don't wear mask" (08/16/2013)   Squamous cell carcinoma of skin of scalp    Umbilical hernia    a.) s/p repair in approx 2011     Family History  Problem Relation Age of Onset   Hypertension Mother    Heart attack Father 28     Past Surgical History:  Procedure Laterality Date   CATARACT EXTRACTION W/ INTRAOCULAR LENS IMPLANT Right  07/05/2022   Procedure: CATARACT EXTRACTION W/ INTRAOCULAR LENS IMPLANT; Location: [facility]   CATARACT EXTRACTION W/ INTRAOCULAR LENS IMPLANT Left 07/19/2022   Procedure: CATARACT EXTRACTION W/ INTRAOCULAR LENS IMPLANT; Location: [facility]   HOLEP-LASER ENUCLEATION OF THE PROSTATE WITH MORCELLATION N/A 07/30/2022   Procedure: HOLEP-LASER ENUCLEATION OF THE PROSTATE WITH MORCELLATION;  Surgeon: Billey Co, MD;  Location: [facility] ORS;  Service: Urology;  Laterality: N/A;   INGUINAL HERNIA REPAIR Right ~ 1950   TEE WITHOUT CARDIOVERSION N/A 08/17/2013   Procedure: TRANSESOPHAGEAL ECHOCARDIOGRAM (TEE);  Surgeon: Candee Furbish, MD;  Location: [facility] ENDOSCOPY;  Service: Cardiovascular;  Laterality: N/A;    TONSILLECTOMY  ~ 0938   UMBILICAL HERNIA REPAIR  ~ 2011    Social History   Socioeconomic History   Marital status: Married    Spouse name: Not on file   Number of children: Not on file   Years of education: Not on file   Highest education level: Not on file  Occupational History   Not on file  Tobacco Use   Smoking status: Never    Passive exposure: Never   Smokeless tobacco: Never  Vaping Use   Vaping Use: Never used  Substance and Sexual Activity   Alcohol use: Yes    Alcohol/week: 2.0 standard drinks of alcohol    Types: 2 Glasses of wine per week    Comment: occasionally   Drug use: No   Sexual activity: Yes  Other Topics Concern   Not on file  Social History Narrative   Not on file   Social Determinants of Health   Financial Resource Strain: Not on file  Food Insecurity: Not on file  Transportation Needs: Not on file  Physical Activity: Not on file  Stress: Not on file  Social Connections: Not on file  Intimate Partner Violence: Not on file     Allergies  Allergen Reactions   Finasteride     Other reaction(s): felt bad, Other (See Comments)     Outpatient Medications Prior to Visit  Medication Sig Dispense Refill   azithromycin (ZITHROMAX) 250 MG tablet As directed     rosuvastatin (CRESTOR) 10 MG tablet Take 10 mg by mouth daily.     valsartan (DIOVAN) 160 MG tablet Take 160 mg by mouth daily.     HYDROcodone bit-homatropine (HYCODAN) 5-1.5 MG/5ML syrup Take 5 mLs by mouth every 6 (six) hours as needed for cough. 120 mL 0   montelukast (SINGULAIR) 10 MG tablet Take 10 mg by mouth at bedtime.     predniSONE (DELTASONE) 20 MG tablet Take 2 tablets (40 mg total) by mouth daily. 10 tablet 0   valsartan-hydrochlorothiazide (DIOVAN-HCT) 320-12.5 MG tablet Take 1 tablet by mouth daily.     No facility-administered medications prior to visit.       Objective:   Physical Exam:  General appearance: 77 y.o., male, NAD, conversant  Eyes: anicteric  sclerae; PERRL, tracking appropriately HENT: NCAT; MMM  Neck: Trachea midline; no lymphadenopathy, no JVD Lungs: rhonchi bl, with normal respiratory effort CV: RRR, no murmur  Abdomen: Soft, non-tender; non-distended, BS present  Extremities: No peripheral edema, warm Skin: Normal turgor and texture; no rash Psych: Appropriate affect Neuro: Alert and oriented to person and place, no focal deficit     Vitals:   01/05/23 1122  BP: 118/64  Pulse: 84  Temp: 98.4 F (36.9 C)  TempSrc: Oral  SpO2: 94%  Weight: 186 lb 12.8 oz (84.7 kg)  Height: _0  (1.727 m)  94% on RA BMI Readings from Last 3 Encounters:  01/05/23 28.40 kg/m  10/14/22 30.26 kg/m  07/30/22 30.56 kg/m   Wt Readings from Last 3 Encounters:  01/05/23 186 lb 12.8 oz (84.7 kg)  10/14/22 199 lb (90.3 kg)  07/30/22 200 lb 15.9 oz (91.2 kg)     CBC    Component Value Date/Time   WBC 9.1 05/26/2022 1427   RBC 4.51 05/26/2022 1427   HGB 12.9 (L) 07/30/2022 0853   HCT 38.0 (L) 07/30/2022 0853   PLT 198 05/26/2022 1427   MCV 89.8 05/26/2022 1427   MCH 30.2 05/26/2022 1427   MCHC 33.6 05/26/2022 1427   RDW 13.2 05/26/2022 1427   LYMPHSABS 1.5 11/13/2021 1814   MONOABS 0.3 11/13/2021 1814   EOSABS 0.1 11/13/2021 1814   BASOSABS 0.0 11/13/2021 1814    Eos 0-100 IgE 11 in 2019 with negative allergy panel  Chest Imaging: CXR 11/13/21 reviewed by me unremarkable  CXR 05/24/22 reviewed by me unremarkable  CXR 06/08/22 reviewed by me with possible R perihilar opacity but I think this looks pretty similar to priors  Pulmonary Functions Testing Results:    Latest Ref Rng & Units 01/03/2023    9:03 AM  PFT Results  FVC-Pre L 1.98  P  FVC-Predicted Pre % 51  P  FVC-Post L 2.30  P  FVC-Predicted Post % 59  P  Pre FEV1/FVC % % 55  P  Post FEV1/FCV % % 58  P  FEV1-Pre L 1.10  P  FEV1-Predicted Pre % 39  P  FEV1-Post L 1.33  P  DLCO uncorrected ml/min/mmHg 15.40  P  DLCO UNC% % 65  P  DLCO  corrected ml/min/mmHg 15.40  P  DLCO COR %Predicted % 65  P  DLVA Predicted % 97  P  TLC L 5.20  P  TLC % Predicted % 78  P  RV % Predicted % 121  P    P Preliminary result   TTE 12/29/22   NORMAL LEFT VENTRICULAR SYSTOLIC FUNCTION    NORMAL RIGHT VENTRICULAR SYSTOLIC FUNCTION    VALVULAR REGURGITATION: SEVERE AR, TRIVIAL MR, MODERATE PR, TRIVIAL TR    NO VALVULAR STENOSIS    MODERATE TO SEVERE AR WITH HOLODIASTOLIC FLOW REVERSAL IN DESCENDING AORTA    MILDLY DILATED AORTIC ROOT AND ASCENDING AORTA    3D acquisition and reconstructions were performed as part of this    examination to more accurately quantify the effects of moderate or greater    valve regurgitation. (post-processing on an Independent workstation).     Assessment & Plan:   # Subacute on chronic cough: May be multifactorial - has sinus congestion/PND and in past has had CRS with polyps with some response to singulair. Today found to have untreated obstructive lung disease which is probably contributory and he's had a R perihilar opacity for a while not completely characterized on CXR alone.  # Obstructive lung disease, bronchodilator response Possible ACOS - secondhand smoke exposure historically and some pesticide exposures, etc  # ?R perihilar opacity  Plan: - CT Chest - resume nasal irrigation  - resume flonase 1 spray each nare after nasal irrigation - start breztri, will price out inhalers  - tessalon perles, dextromethorphan as needed    RTC 3-4 weeks   Maryjane Hurter, MD [facility] 01/05/2023 11:33 AM

## 2023-01-03 ENCOUNTER — Ambulatory Visit (INDEPENDENT_AMBULATORY_CARE_PROVIDER_SITE_OTHER): Payer: Medicare Other | Admitting: Student

## 2023-01-03 DIAGNOSIS — R052 Subacute cough: Secondary | ICD-10-CM | POA: Diagnosis not present

## 2023-01-03 LAB — PULMONARY FUNCTION TEST
DL/VA % pred: 97 %
DL/VA: 3.9 ml/min/mmHg/L
DLCO cor % pred: 65 %
DLCO cor: 15.4 ml/min/mmHg
DLCO unc % pred: 65 %
DLCO unc: 15.4 ml/min/mmHg
FEF 25-75 Post: 0.83 L/sec
FEF 25-75 Pre: 0.5 L/sec
FEF2575-%Change-Post: 66 %
FEF2575-%Pred-Post: 41 %
FEF2575-%Pred-Pre: 24 %
FEV1-%Change-Post: 21 %
FEV1-%Pred-Post: 47 %
FEV1-%Pred-Pre: 39 %
FEV1-Post: 1.33 L
FEV1-Pre: 1.1 L
FEV1FVC-%Change-Post: 4 %
FEV1FVC-%Pred-Pre: 76 %
FEV6-%Change-Post: 16 %
FEV6-%Pred-Post: 62 %
FEV6-%Pred-Pre: 54 %
FEV6-Post: 2.26 L
FEV6-Pre: 1.95 L
FEV6FVC-%Change-Post: 0 %
FEV6FVC-%Pred-Post: 105 %
FEV6FVC-%Pred-Pre: 105 %
FVC-%Change-Post: 16 %
FVC-%Pred-Post: 59 %
FVC-%Pred-Pre: 51 %
FVC-Post: 2.3 L
FVC-Pre: 1.98 L
Post FEV1/FVC ratio: 58 %
Post FEV6/FVC ratio: 98 %
Pre FEV1/FVC ratio: 55 %
Pre FEV6/FVC Ratio: 98 %
RV % pred: 121 %
RV: 3 L
TLC % pred: 78 %
TLC: 5.2 L

## 2023-01-03 NOTE — Progress Notes (Signed)
PFT done today. 

## 2023-01-05 ENCOUNTER — Telehealth: Payer: Self-pay | Admitting: Student

## 2023-01-05 ENCOUNTER — Encounter: Payer: Self-pay | Admitting: Student

## 2023-01-05 ENCOUNTER — Ambulatory Visit (INDEPENDENT_AMBULATORY_CARE_PROVIDER_SITE_OTHER): Payer: Medicare Other | Admitting: Student

## 2023-01-05 VITALS — BP 118/64 | HR 84 | Temp 98.4°F | Ht 68.0 in | Wt 186.8 lb

## 2023-01-05 DIAGNOSIS — J449 Chronic obstructive pulmonary disease, unspecified: Secondary | ICD-10-CM

## 2023-01-05 DIAGNOSIS — R9389 Abnormal findings on diagnostic imaging of other specified body structures: Secondary | ICD-10-CM | POA: Diagnosis not present

## 2023-01-05 DIAGNOSIS — R053 Chronic cough: Secondary | ICD-10-CM | POA: Diagnosis not present

## 2023-01-05 MED ORDER — BREZTRI AEROSPHERE 160-9-4.8 MCG/ACT IN AERO: 2.0000 | INHALATION_SPRAY | Freq: Two times a day (BID) | RESPIRATORY_TRACT | 0 refills | Status: DC

## 2023-01-05 NOTE — Patient Instructions (Addendum)
-  CT Chest you will be called to schedule - breztri 2 puffs twice daily rinse mouth after use - take a shower, clear your nose out of crusting/drainage or use neti pot then use flonase 1 spray each nostril and restart singulair. Need to continue this regimen for at least 8 weeks to kick your cough and postnasal drainage completely.  - see you in 3-4 weeks - postpone appointment if CT Chest not done before next visit

## 2023-01-05 NOTE — Telephone Encounter (Signed)
What is least expensive triple therapy inhaler? LABA/ICS?  Thanks!

## 2023-01-06 ENCOUNTER — Other Ambulatory Visit (HOSPITAL_COMMUNITY): Payer: Self-pay

## 2023-01-06 NOTE — Telephone Encounter (Signed)
Trelegy is currently covered under the patients plan ($30.00 per test claim), Judithann Sauger is also showing covered but refill too soon due to last date of fill.

## 2023-01-10 MED ORDER — BREZTRI AEROSPHERE 160-9-4.8 MCG/ACT IN AERO: 2.0000 | INHALATION_SPRAY | Freq: Two times a day (BID) | RESPIRATORY_TRACT | 11 refills | Status: DC

## 2023-01-18 ENCOUNTER — Ambulatory Visit (HOSPITAL_COMMUNITY)
Admission: RE | Admit: 2023-01-18 | Discharge: 2023-01-18 | Disposition: A | Discharge status: Home / Self Care | Payer: Medicare Other | Source: Ambulatory Visit | Attending: Student | Admitting: Student

## 2023-01-18 DIAGNOSIS — R9389 Abnormal findings on diagnostic imaging of other specified body structures: Secondary | ICD-10-CM | POA: Insufficient documentation

## 2023-01-29 NOTE — Progress Notes (Unsigned)
Synopsis: Referred for chronic cough by Lawerance Cruel, MD  Subjective:   PATIENT ID: Eric Chambers GENDER: male DOB: 1946/09/23, MRN: [id number]  No chief complaint on file.  77yM with AR, endocarditis, HTN, frequent pneumonia, OSA, GERD referred for cough - saw Dr. 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 Says he just has a hard time clearing chest colds when he gets them. Has had suspected cough-related syncope.  If he takes a double dose of tessalon perles it knocks out cough.   He has been on singulair for about a month. He's unsure if it's been helpful for his cough but his cough did improve over the course of the month that he's been on it.  He was having quite a bit of sinus congestion and postnasal drainage prior to starting   Interval HPI:  Resumed flonase, nasal irrigation, singulair last visi, PFTs ordered  Seen in UC 11/24/22 for viral URI and cough given prednisone, hycodan  Was on amoxicillin 3 weeks ago. Went on azithromycin a day ago. Cough has pretty much persisted since trip to urgent care. He does still have sinus congestion, postnasal drainage. No fever.   Only tried flonase for a day or two. Didn't try nasal irrigation. Stopped singulair two weeks ago. No overt heartburn unless he eats a big steak before bed.  ----------------------------------------- Started on breztri last visit   CT Chest ordered due to persistent R perihilar opacity, cough. No nodule or mass.   Otherwise pertinent review of systems is negative.  Past Medical History:  Diagnosis Date   Aortic root dilatation ([place]) 04/08/2016   a.) TTE 04/08/2016: Ao root measured 39 mm; b.) TTE 01/12/2018: Ao root measured 38 mm; c.) TTE 06/20/2020: Ao root measured 40 mm, asc Ao 39 mm   Aortic valve regurgitation    Bilateral cataracts    a.) s/p extraction with IOL placement   Diastolic dysfunction    a.) TTE 04/08/2016: EF 55-60%, triv TR, mod AR, atrial septal aneurysm,  G1DD; b.) TTE 01/12/2018: EF 60-65%, mild-mod AoV calcification, mod-sev AR, G1DD; c.) TTE 06/20/2020: EF 65-70%, sev LVH, mild AoV sclerosis with mod AR, triv MR; d.) TTE 03/02/2021: EF >55%, mild LVH, GLS -16.0%, MAC/TAC, trivial TR, moderate AR/PR.   Endocarditis 08/17/2013   Possible vegetation associated with tricuspid valve (seen on post tx echo as well)    Endocarditis 2009   a.) while living in [place]   Enlarged prostate    GERD (gastroesophageal reflux disease)    Heart murmur    "think I have one now" (08/16/2013)   High cholesterol    Hypertension    PSVT (paroxysmal supraventricular tachycardia) 01/18/2022   a.) Holter 01/18/2022: 16 SVT runs with fastest interval lasting 7 beats with a max rate of 222 bpm, longest lasted 17 beats at a rate of 125 bpm   Recurrent pneumonia    "almost once/yr" (08/16/2013)   Sleep apnea    "don't wear mask" (08/16/2013)   Squamous cell carcinoma of skin of scalp    Umbilical hernia    a.) s/p repair in approx 2011     Family History  Problem Relation Age of Onset   Hypertension Mother    Heart attack Father 60     Past Surgical History:  Procedure Laterality Date   CATARACT EXTRACTION W/ INTRAOCULAR LENS IMPLANT Right 07/05/2022   Procedure: CATARACT EXTRACTION W/ INTRAOCULAR LENS IMPLANT; Location: [facility]   CATARACT EXTRACTION W/  INTRAOCULAR LENS IMPLANT Left 07/19/2022   Procedure: CATARACT EXTRACTION W/ INTRAOCULAR LENS IMPLANT; Location: [facility]   HOLEP-LASER ENUCLEATION OF THE PROSTATE WITH MORCELLATION N/A 07/30/2022   Procedure: HOLEP-LASER ENUCLEATION OF THE PROSTATE WITH MORCELLATION;  Surgeon: Billey Co, MD;  Location: [facility] ORS;  Service: Urology;  Laterality: N/A;   INGUINAL HERNIA REPAIR Right ~ 1950   TEE WITHOUT CARDIOVERSION N/A 08/17/2013   Procedure: TRANSESOPHAGEAL ECHOCARDIOGRAM (TEE);  Surgeon: Candee Furbish, MD;  Location: [facility] ENDOSCOPY;  Service: Cardiovascular;  Laterality: N/A;   TONSILLECTOMY  ~ 123456    UMBILICAL HERNIA REPAIR  ~ 2011    Social History   Socioeconomic History   Marital status: Married    Spouse name: Not on file   Number of children: Not on file   Years of education: Not on file   Highest education level: Not on file  Occupational History   Not on file  Tobacco Use   Smoking status: Never    Passive exposure: Never   Smokeless tobacco: Never  Vaping Use   Vaping Use: Never used  Substance and Sexual Activity   Alcohol use: Yes    Alcohol/week: 2.0 standard drinks of alcohol    Types: 2 Glasses of wine per week    Comment: occasionally   Drug use: No   Sexual activity: Yes  Other Topics Concern   Not on file  Social History Narrative   Not on file   Social Determinants of Health   Financial Resource Strain: Not on file  Food Insecurity: Not on file  Transportation Needs: Not on file  Physical Activity: Not on file  Stress: Not on file  Social Connections: Not on file  Intimate Partner Violence: Not on file     Allergies  Allergen Reactions   Finasteride     Other reaction(s): felt bad, Other (See Comments)     Outpatient Medications Prior to Visit  Medication Sig Dispense Refill   azithromycin (ZITHROMAX) 250 MG tablet As directed     Budeson-Glycopyrrol-Formoterol (BREZTRI AEROSPHERE) 160-9-4.8 MCG/ACT AERO Inhale 2 puffs into the lungs in the morning and at bedtime. 5.9 g 0   Budeson-Glycopyrrol-Formoterol (BREZTRI AEROSPHERE) 160-9-4.8 MCG/ACT AERO Inhale 2 puffs into the lungs in the morning and at bedtime. 5.9 g 0   Budeson-Glycopyrrol-Formoterol (BREZTRI AEROSPHERE) 160-9-4.8 MCG/ACT AERO Inhale 2 puffs into the lungs in the morning and at bedtime. 1 each 11   rosuvastatin (CRESTOR) 10 MG tablet Take 10 mg by mouth daily.     valsartan (DIOVAN) 160 MG tablet Take 160 mg by mouth daily.     No facility-administered medications prior to visit.       Objective:   Physical Exam:  General appearance: 77 y.o., male, NAD, conversant   Eyes: anicteric sclerae; PERRL, tracking appropriately HENT: NCAT; MMM  Neck: Trachea midline; no lymphadenopathy, no JVD Lungs: rhonchi bl, with normal respiratory effort CV: RRR, no murmur  Abdomen: Soft, non-tender; non-distended, BS present  Extremities: No peripheral edema, warm Skin: Normal turgor and texture; no rash Psych: Appropriate affect Neuro: Alert and oriented to person and place, no focal deficit     There were no vitals filed for this visit.      on RA BMI Readings from Last 3 Encounters:  01/05/23 28.40 kg/m  10/14/22 30.26 kg/m  07/30/22 30.56 kg/m   Wt Readings from Last 3 Encounters:  01/05/23 186 lb 12.8 oz (84.7 kg)  10/14/22 199 lb (90.3 kg)  07/30/22 200 lb 15.9 oz (  91.2 kg)     CBC    Component Value Date/Time   WBC 9.1 05/26/2022 1427   RBC 4.51 05/26/2022 1427   HGB 12.9 (L) 07/30/2022 0853   HCT 38.0 (L) 07/30/2022 0853   PLT 198 05/26/2022 1427   MCV 89.8 05/26/2022 1427   MCH 30.2 05/26/2022 1427   MCHC 33.6 05/26/2022 1427   RDW 13.2 05/26/2022 1427   LYMPHSABS 1.5 11/13/2021 1814   MONOABS 0.3 11/13/2021 1814   EOSABS 0.1 11/13/2021 1814   BASOSABS 0.0 11/13/2021 1814    Eos 0-100 IgE 11 in 2019 with negative allergy panel  Chest Imaging: CXR 11/13/21 reviewed by me unremarkable  CXR 05/24/22 reviewed by me unremarkable  CXR 06/08/22 reviewed by me with possible R perihilar opacity but I think this looks pretty similar to priors  CT Chest 01/19/23 reviewed by me with bronchial wall thickening, prominent PA trunk  Pulmonary Functions Testing Results:    Latest Ref Rng & Units 01/03/2023    9:03 AM  PFT Results  FVC-Pre L 1.98   FVC-Predicted Pre % 51   FVC-Post L 2.30   FVC-Predicted Post % 59   Pre FEV1/FVC % % 55   Post FEV1/FCV % % 58   FEV1-Pre L 1.10   FEV1-Predicted Pre % 39   FEV1-Post L 1.33   DLCO uncorrected ml/min/mmHg 15.40   DLCO UNC% % 65   DLCO corrected ml/min/mmHg 15.40   DLCO COR %Predicted  % 65   DLVA Predicted % 97   TLC L 5.20   TLC % Predicted % 78   RV % Predicted % 121    PFT 01/03/23 with severe obstruction, air trapping, +BD response, mildly reduced diffusing capacity  TTE 12/29/22   NORMAL LEFT VENTRICULAR SYSTOLIC FUNCTION    NORMAL RIGHT VENTRICULAR SYSTOLIC FUNCTION    VALVULAR REGURGITATION: SEVERE AR, TRIVIAL MR, MODERATE PR, TRIVIAL TR    NO VALVULAR STENOSIS    MODERATE TO SEVERE AR WITH HOLODIASTOLIC FLOW REVERSAL IN DESCENDING AORTA    MILDLY DILATED AORTIC ROOT AND ASCENDING AORTA    3D acquisition and reconstructions were performed as part of this    examination to more accurately quantify the effects of moderate or greater    valve regurgitation. (post-processing on an Independent workstation).     Assessment & Plan:   # Subacute on chronic cough: May be multifactorial - has sinus congestion/PND and in past has had CRS with polyps with some response to singulair. Today found to have untreated obstructive lung disease which is probably contributory and he's had a R perihilar opacity for a while not completely characterized on CXR alone.  # Obstructive lung disease, bronchodilator response Possible ACOS - secondhand smoke exposure historically and some pesticide exposures, etc  # ?R perihilar opacity  Plan: - CT Chest - resume nasal irrigation  - resume flonase 1 spray each nare after nasal irrigation - start breztri, will price out inhalers  - tessalon perles, dextromethorphan as needed    RTC 3-4 weeks   Maryjane Hurter, MD [facility] 01/29/2023 3:53 PM

## 2023-01-31 ENCOUNTER — Ambulatory Visit (INDEPENDENT_AMBULATORY_CARE_PROVIDER_SITE_OTHER): Payer: Medicare Other | Admitting: Student

## 2023-01-31 ENCOUNTER — Encounter: Payer: Self-pay | Admitting: Student

## 2023-01-31 VITALS — BP 122/64 | HR 67 | Temp 98.0°F | Ht 68.0 in | Wt 189.8 lb

## 2023-01-31 DIAGNOSIS — R053 Chronic cough: Secondary | ICD-10-CM

## 2023-01-31 DIAGNOSIS — J4489 Other specified chronic obstructive pulmonary disease: Secondary | ICD-10-CM

## 2023-01-31 DIAGNOSIS — R9389 Abnormal findings on diagnostic imaging of other specified body structures: Secondary | ICD-10-CM | POA: Diagnosis not present

## 2023-01-31 NOTE — Patient Instructions (Signed)
-   breztri 2 puffs once daily rinse mouth after use. If cough coming back then try increasing to 2 puffs twice daily, if this is insufficient then call our clinic to make appointment - take a shower, clear your nose out of crusting/drainage or use neti pot then use flonase 1 spray each nostril and can restart singulair if postnasal drainage active. Need to continue this regimen for at least 8 weeks to kick your cough and postnasal drainage completely if they come back.  - see you in June!

## 2023-02-18 ENCOUNTER — Telehealth: Payer: Self-pay | Admitting: Student

## 2023-02-18 NOTE — Telephone Encounter (Signed)
Can you please assist .

## 2023-02-18 NOTE — Telephone Encounter (Signed)
PT is having hip replacement surgery and needs Surgery clearance. They have fax'd ppk to [place].  [facility]  Fax [phone] Coordinator is Natural Steps High @ [phone]

## 2023-02-21 NOTE — Telephone Encounter (Signed)
For Mr. Reinsmith, risk of perioperative pulmonary complications is increased by:  '[X]'$ Age greater than 65 years  '[X]'$  COPD  '[ ]'$  Serum albumin <3.5  '[ ]'$  Smoking  '[X]'$  Obstructive sleep apnea  '[ ]'$  NYHA Class II Pulmonary Hypertension    Respiratory complications generally occur in 1% of ASA Class I patients, 5% of ASA Class II and 10% of ASA Class III-IV patients These complications rarely result in mortality and include postoperative pneumonia, atelectasis, pulmonary embolism, ARDS and increased time requiring postoperative mechanical ventilation.  Overall, I recommend proceeding with the surgery if the risk for respiratory complications are outweighed by the potential benefits. This will need to be discussed between the patient and surgeon.  To reduce risks of respiratory complications, I recommend: --Smoking cessation 6-8 weeks if possible if patient is an active smoker --Pre- and post-operative incentive spirometry performed frequently while awake --Avoiding use of pancuronium during anesthesia. --Encourage mobility and incentive spirometry post-op  For any questions or concerns, please contact our office.

## 2023-02-21 NOTE — Telephone Encounter (Signed)
OV notes and clearance form have been faxed back to [facility]. Nothing further needed at this time.  °

## 2023-02-21 NOTE — Telephone Encounter (Signed)
Fax received from Dr. Charlies Constable with [facility] to perform a RIGHT TOTAL HIP REPLACEMENT on patient.  Patient needs surgery clearance. Surgery is PENDING. Patient was seen on 02/02/2023. Office protocol is a risk assessment can be sent to surgeon if patient has been seen in 60 days or less.   Sending to Dr Verlee Monte for risk assessment or recommendations if patient needs to be seen in office prior to surgical procedure.

## 2023-02-24 ENCOUNTER — Ambulatory Visit: Payer: Medicare Other | Admitting: Nurse Practitioner

## 2023-03-07 NOTE — Progress Notes (Signed)
Sent message, via epic in basket, requesting orders in epic from surgeon.  

## 2023-03-10 ENCOUNTER — Ambulatory Visit (HOSPITAL_COMMUNITY): Payer: Self-pay | Admitting: Emergency Medicine

## 2023-03-10 ENCOUNTER — Ambulatory Visit: Payer: Self-pay | Admitting: Physician Assistant

## 2023-03-10 ENCOUNTER — Encounter (HOSPITAL_COMMUNITY): Payer: Medicare Other

## 2023-03-10 DIAGNOSIS — M25551 Pain in right hip: Secondary | ICD-10-CM

## 2023-03-10 DIAGNOSIS — M1611 Unilateral primary osteoarthritis, right hip: Secondary | ICD-10-CM

## 2023-03-10 NOTE — H&P (View-Only) (Signed)
TOTAL HIP ADMISSION H&P  Patient is admitted for right total hip arthroplasty.  Subjective:  Chief Complaint: right hip pain  HPI: Eric Chambers, 77 y.o. male, has a history of pain and functional disability in the right hip(s) due to arthritis and patient has failed non-surgical conservative treatments for greater than 12 weeks to include NSAID's and/or analgesics, corticosteriod injections, supervised PT with diminished ADL's post treatment, and activity modification.  Onset of symptoms was gradual starting 5 years ago with gradually worsening course since that time.The patient noted no past surgery on the right hip(s).  Patient currently rates pain in the right hip at 4 out of 10 with activity. Patient has night pain, worsening of pain with activity and weight bearing, trendelenberg gait, pain that interfers with activities of daily living, and pain with passive range of motion. Patient has evidence of periarticular osteophytes and joint space narrowing by imaging studies. This condition presents safety issues increasing the risk of falls.  There is no current active infection.  Patient Active Problem List   Diagnosis Date Noted  . Cough syncope 10/12/2018  . Cough variant asthma vs UACS with cough syncope  10/11/2018  . Hypokalemia 08/29/2013  . Anemia due to acute blood loss 08/29/2013  . Diarrhea 08/29/2013  . Vomiting 08/29/2013  . OSA (obstructive sleep apnea) 08/16/2013  . FUO (fever of unknown origin) 08/16/2013  . Aortic regurgitation 08/16/2013  . Pure hypercholesterolemia   . Hypertension   . Enlarged prostate   . Endocarditis    Past Medical History:  Diagnosis Date  . Aortic root dilatation (HCC) 04/08/2016   a.) TTE 04/08/2016: Ao root measured 39 mm; b.) TTE 01/12/2018: Ao root measured 38 mm; c.) TTE 06/20/2020: Ao root measured 40 mm, asc Ao 39 mm  . Aortic valve regurgitation   . Bilateral cataracts    a.) s/p extraction with IOL placement  . Diastolic dysfunction     a.) TTE 04/08/2016: EF 55-60%, triv TR, mod AR, atrial septal aneurysm, G1DD; b.) TTE 01/12/2018: EF 60-65%, mild-mod AoV calcification, mod-sev AR, G1DD; c.) TTE 06/20/2020: EF 65-70%, sev LVH, mild AoV sclerosis with mod AR, triv MR; d.) TTE 03/02/2021: EF >55%, mild LVH, GLS -16.0%, MAC/TAC, trivial TR, moderate AR/PR.  . Endocarditis 08/17/2013   Possible vegetation associated with tricuspid valve (seen on post tx echo as well)   . Endocarditis 2009   a.) while living in [place]  . Enlarged prostate   . GERD (gastroesophageal reflux disease)   . Heart murmur    "think I have one now" (08/16/2013)  . High cholesterol   . Hypertension   . PSVT (paroxysmal supraventricular tachycardia) 01/18/2022   a.) Holter 01/18/2022: 16 SVT runs with fastest interval lasting 7 beats with a max rate of 222 bpm, longest lasted 17 beats at a rate of 125 bpm  . Recurrent pneumonia    "almost once/yr" (08/16/2013)  . Sleep apnea    "don't wear mask" (08/16/2013)  . Squamous cell carcinoma of skin of scalp   . Umbilical hernia    a.) s/p repair in approx 2011    Past Surgical History:  Procedure Laterality Date  . CATARACT EXTRACTION W/ INTRAOCULAR LENS IMPLANT Right 07/05/2022   Procedure: CATARACT EXTRACTION W/ INTRAOCULAR LENS IMPLANT; Location: [facility]  . CATARACT EXTRACTION W/ INTRAOCULAR LENS IMPLANT Left 07/19/2022   Procedure: CATARACT EXTRACTION W/ INTRAOCULAR LENS IMPLANT; Location: [facility]  . HOLEP-LASER ENUCLEATION OF THE PROSTATE WITH MORCELLATION N/A 07/30/2022   Procedure: HOLEP-LASER ENUCLEATION OF   THE PROSTATE WITH MORCELLATION;  Surgeon: Sninsky, Brian C, MD;  Location: [facility];  Service: Urology;  Laterality: N/A;  . INGUINAL HERNIA REPAIR Right ~ 1950  . TEE WITHOUT CARDIOVERSION N/A 08/17/2013   Procedure: TRANSESOPHAGEAL ECHOCARDIOGRAM (TEE);  Surgeon: Mark Skains, MD;  Location: [facility];  Service: Cardiovascular;  Laterality: N/A;  . TONSILLECTOMY  ~ 1957  . UMBILICAL HERNIA  REPAIR  ~ 2011    Current Outpatient Medications  Medication Sig Dispense Refill Last Dose  . Budeson-Glycopyrrol-Formoterol (BREZTRI AEROSPHERE) 160-9-4.8 MCG/ACT AERO Inhale 2 puffs into the lungs in the morning and at bedtime. (Patient taking differently: Inhale 2 puffs into the lungs daily.) 1 each 11   . rosuvastatin (CRESTOR) 10 MG tablet Take 10 mg by mouth daily.     . telmisartan (MICARDIS) 40 MG tablet Take 40 mg by mouth daily.      No current facility-administered medications for this visit.   Allergies  Allergen Reactions  . Norvasc [Amlodipine Besylate] Swelling    Leg swelling     Social History   Tobacco Use  . Smoking status: Never    Passive exposure: Never  . Smokeless tobacco: Never  Substance Use Topics  . Alcohol use: Yes    Alcohol/week: 2.0 standard drinks of alcohol    Types: 2 Glasses of wine per week    Comment: occasionally    Family History  Problem Relation Age of Onset  . Hypertension Mother   . Heart attack Father 50     Review of Systems  Musculoskeletal:  Positive for arthralgias.  All other systems reviewed and are negative.  Objective:  Physical Exam Constitutional:      General: He is not in acute distress.    Appearance: Normal appearance.  HENT:     Head: Normocephalic and atraumatic.  Eyes:     Extraocular Movements: Extraocular movements intact.     Pupils: Pupils are equal, round, and reactive to light.  Cardiovascular:     Rate and Rhythm: Normal rate and regular rhythm.     Pulses: Normal pulses.     Heart sounds: Murmur heard.  Pulmonary:     Effort: Pulmonary effort is normal. No respiratory distress.     Breath sounds: Normal breath sounds. No wheezing.  Abdominal:     General: Abdomen is flat. Bowel sounds are normal. There is no distension.     Palpations: Abdomen is soft.     Tenderness: There is no abdominal tenderness.  Musculoskeletal:     Cervical back: Normal range of motion and neck supple.     Right  hip: Tenderness and bony tenderness present. Decreased range of motion.  Lymphadenopathy:     Cervical: No cervical adenopathy.  Skin:    General: Skin is warm and dry.     Findings: No erythema or rash.  Neurological:     General: No focal deficit present.     Mental Status: He is alert and oriented to person, place, and time.  Psychiatric:        Mood and Affect: Mood normal.        Behavior: Behavior normal.   Vital signs in last 24 hours: @VSRANGES@  Labs:   Estimated body mass index is 28.86 kg/m as calculated from the following:   Height as of 01/31/23: 5' 8" (1.727 m).   Weight as of 01/31/23: 86.1 kg.   Imaging Review Plain radiographs demonstrate moderate degenerative joint disease of the right hip(s). The bone quality appears   to be good for age and reported activity level.      Assessment/Plan:  End stage arthritis, right hip(s)  The patient history, physical examination, clinical judgement of the provider and imaging studies are consistent with end stage degenerative joint disease of the right hip(s) and total hip arthroplasty is deemed medically necessary. The treatment options including medical management, injection therapy, arthroscopy and arthroplasty were discussed at length. The risks and benefits of total hip arthroplasty were presented and reviewed. The risks due to aseptic loosening, infection, stiffness, dislocation/subluxation,  thromboembolic complications and other imponderables were discussed.  The patient acknowledged the explanation, agreed to proceed with the plan and consent was signed. Patient is being admitted for inpatient treatment for surgery, pain control, PT, OT, prophylactic antibiotics, VTE prophylaxis, progressive ambulation and ADL's and discharge planning.The patient is planning to be discharged  home outpt PT    Patient's anticipated LOS is less than 2 midnights, meeting these requirements: - Younger than 65 - Lives within 1 hour of  care - Has a competent adult at home to recover with post-op recover - NO history of  - Chronic pain requiring opiods  - Diabetes  - Coronary Artery Disease  - Heart failure  - Heart attack  - Stroke  - DVT/VTE  - Cardiac arrhythmia  - Respiratory Failure/COPD  - Renal failure  - Anemia  - Advanced Liver disease   

## 2023-03-10 NOTE — Progress Notes (Signed)
Second request for pre op orders in [facility]: Left a voicemail with Kelly [facility]. 

## 2023-03-10 NOTE — H&P (Signed)
TOTAL HIP ADMISSION H&P  Patient is admitted for right total hip arthroplasty.  Subjective:  Chief Complaint: right hip pain  HPI: Eric Chambers, 77 y.o. male, has a history of pain and functional disability in the right hip(s) due to arthritis and patient has failed non-surgical conservative treatments for greater than 12 weeks to include NSAID's and/or analgesics, corticosteriod injections, supervised PT with diminished ADL's post treatment, and activity modification.  Onset of symptoms was gradual starting 5 years ago with gradually worsening course since that time.The patient noted no past surgery on the right hip(s).  Patient currently rates pain in the right hip at 4 out of 10 with activity. Patient has night pain, worsening of pain with activity and weight bearing, trendelenberg gait, pain that interfers with activities of daily living, and pain with passive range of motion. Patient has evidence of periarticular osteophytes and joint space narrowing by imaging studies. This condition presents safety issues increasing the risk of falls.  There is no current active infection.  Patient Active Problem List   Diagnosis Date Noted  . Cough syncope 10/12/2018  . Cough variant asthma vs UACS with cough syncope  10/11/2018  . Hypokalemia 08/29/2013  . Anemia due to acute blood loss 08/29/2013  . Diarrhea 08/29/2013  . Vomiting 08/29/2013  . OSA (obstructive sleep apnea) 08/16/2013  . FUO (fever of unknown origin) 08/16/2013  . Aortic regurgitation 08/16/2013  . Pure hypercholesterolemia   . Hypertension   . Enlarged prostate   . Endocarditis    Past Medical History:  Diagnosis Date  . Aortic root dilatation ([place]) 04/08/2016   a.) TTE 04/08/2016: Ao root measured 39 mm; b.) TTE 01/12/2018: Ao root measured 38 mm; c.) TTE 06/20/2020: Ao root measured 40 mm, asc Ao 39 mm  . Aortic valve regurgitation   . Bilateral cataracts    a.) s/p extraction with IOL placement  . Diastolic dysfunction     a.) TTE 04/08/2016: EF 55-60%, triv TR, mod AR, atrial septal aneurysm, G1DD; b.) TTE 01/12/2018: EF 60-65%, mild-mod AoV calcification, mod-sev AR, G1DD; c.) TTE 06/20/2020: EF 65-70%, sev LVH, mild AoV sclerosis with mod AR, triv MR; d.) TTE 03/02/2021: EF >55%, mild LVH, GLS -16.0%, MAC/TAC, trivial TR, moderate AR/PR.  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 Endocarditis 2009   a.) while living in [place]  . Enlarged prostate   . GERD (gastroesophageal reflux disease)   . Heart murmur    "think I have one now" (08/16/2013)  . High cholesterol   . Hypertension   . PSVT (paroxysmal supraventricular tachycardia) 01/18/2022   a.) Holter 01/18/2022: 16 SVT runs with fastest interval lasting 7 beats with a max rate of 222 bpm, longest lasted 17 beats at a rate of 125 bpm  . Recurrent pneumonia    "almost once/yr" (08/16/2013)  . Sleep apnea    "don't wear mask" (08/16/2013)  . Squamous cell carcinoma of skin of scalp   . Umbilical hernia    a.) s/p repair in approx 2011    Past Surgical History:  Procedure Laterality Date  . CATARACT EXTRACTION W/ INTRAOCULAR LENS IMPLANT Right 07/05/2022   Procedure: CATARACT EXTRACTION W/ INTRAOCULAR LENS IMPLANT; Location: [facility]  . CATARACT EXTRACTION W/ INTRAOCULAR LENS IMPLANT Left 07/19/2022   Procedure: CATARACT EXTRACTION W/ INTRAOCULAR LENS IMPLANT; Location: [facility]  . HOLEP-LASER ENUCLEATION OF THE PROSTATE WITH MORCELLATION N/A 07/30/2022   Procedure: HOLEP-LASER ENUCLEATION OF  THE PROSTATE WITH MORCELLATION;  Surgeon: Billey Co, MD;  Location: [facility];  Service: Urology;  Laterality: N/A;  . INGUINAL HERNIA REPAIR Right ~ 1950  . TEE WITHOUT CARDIOVERSION N/A 08/17/2013   Procedure: TRANSESOPHAGEAL ECHOCARDIOGRAM (TEE);  Surgeon: Candee Furbish, MD;  Location: [facility] END[facility];  Service: Cardiovascular;  Laterality: N/A;  . TONSILLECTOMY  ~ 1957  . UMBILICAL HERNIA  REPAIR  ~ 2011    Current Outpatient Medications  Medication Sig Dispense Refill Last Dose  . Budeson-Glycopyrrol-Formoterol (BREZTRI AEROSPHERE) 160-9-4.8 MCG/ACT AERO Inhale 2 puffs into the lungs in the morning and at bedtime. (Patient taking differently: Inhale 2 puffs into the lungs daily.) 1 each 11   . rosuvastatin (CRESTOR) 10 MG tablet Take 10 mg by mouth daily.     Marland Kitchen telmisartan (MICARDIS) 40 MG tablet Take 40 mg by mouth daily.      No current facility-administered medications for this visit.   Allergies  Allergen Reactions  . Norvasc [Amlodipine Besylate] Swelling    Leg swelling     Social History   Tobacco Use  . Smoking status: Never    Passive exposure: Never  . Smokeless tobacco: Never  Substance Use Topics  . Alcohol use: Yes    Alcohol/week: 2.0 standard drinks of alcohol    Types: 2 Glasses of wine per week    Comment: occasionally    Family History  Problem Relation Age of Onset  . Hypertension Mother   . Heart attack Father 96     Review of Systems  Musculoskeletal:  Positive for arthralgias.  All other systems reviewed and are negative.  Objective:  Physical Exam Constitutional:      General: He is not in acute distress.    Appearance: Normal appearance.  HENT:     Head: Normocephalic and atraumatic.  Eyes:     Extraocular Movements: Extraocular movements intact.     Pupils: Pupils are equal, round, and reactive to light.  Cardiovascular:     Rate and Rhythm: Normal rate and regular rhythm.     Pulses: Normal pulses.     Heart sounds: Murmur heard.  Pulmonary:     Effort: Pulmonary effort is normal. No respiratory distress.     Breath sounds: Normal breath sounds. No wheezing.  Abdominal:     General: Abdomen is flat. Bowel sounds are normal. There is no distension.     Palpations: Abdomen is soft.     Tenderness: There is no abdominal tenderness.  Musculoskeletal:     Cervical back: Normal range of motion and neck supple.     Right  hip: Tenderness and bony tenderness present. Decreased range of motion.  Lymphadenopathy:     Cervical: No cervical adenopathy.  Skin:    General: Skin is warm and dry.     Findings: No erythema or rash.  Neurological:     General: No focal deficit present.     Mental Status: He is alert and oriented to person, place, and time.  Psychiatric:        Mood and Affect: Mood normal.        Behavior: Behavior normal.   Vital signs in last 24 hours: @VSRANGES @  Labs:   Estimated body mass index is 28.86 kg/m as calculated from the following:   Height as of 01/31/23: 5\' 8"  (1.727 m).   Weight as of 01/31/23: 86.1 kg.   Imaging Review Plain radiographs demonstrate moderate degenerative joint disease of the right hip(s). The bone quality appears  to be good for age and reported activity level.      Assessment/Plan:  End stage arthritis, right hip(s)  The patient history, physical examination, clinical judgement of the provider and imaging studies are consistent with end stage degenerative joint disease of the right hip(s) and total hip arthroplasty is deemed medically necessary. The treatment options including medical management, injection therapy, arthroscopy and arthroplasty were discussed at length. The risks and benefits of total hip arthroplasty were presented and reviewed. The risks due to aseptic loosening, infection, stiffness, dislocation/subluxation,  thromboembolic complications and other imponderables were discussed.  The patient acknowledged the explanation, agreed to proceed with the plan and consent was signed. Patient is being admitted for inpatient treatment for surgery, pain control, PT, OT, prophylactic antibiotics, VTE prophylaxis, progressive ambulation and ADL's and discharge planning.The patient is planning to be discharged  home outpt PT    Patient's anticipated LOS is less than 2 midnights, meeting these requirements: - Younger than 33 - Lives within 1 hour of  care - Has a competent adult at home to recover with post-op recover - NO history of  - Chronic pain requiring opiods  - Diabetes  - Coronary Artery Disease  - Heart failure  - Heart attack  - Stroke  - DVT/VTE  - Cardiac arrhythmia  - Respiratory Failure/COPD  - Renal failure  - Anemia  - Advanced Liver disease

## 2023-03-11 NOTE — Patient Instructions (Signed)
SURGICAL WAITING ROOM VISITATION  Patients having surgery or a procedure may have no more than 2 support people in the waiting area - these visitors may rotate.    Children under the age of 71 must have an adult with them who is not the patient.  Due to an increase in RSV and influenza rates and associated hospitalizations, children ages 9 and under may not visit patients in [place].  If the patient needs to stay at the hospital during part of their recovery, the visitor guidelines for inpatient rooms apply. Pre-op nurse will coordinate an appropriate time for 1 support person to accompany patient in pre-op.  This support person may not rotate.    Please refer to the [facility] website for the visitor guidelines for Inpatients (after your surgery is over and you are in a regular room).    Your procedure is scheduled on: 03/23/23   Report to [facility] Main Entrance    Report to admitting at 7:15 AM   Call this number if you have problems the morning of surgery [phone]   Do not eat food :After Midnight.   After Midnight you may have the following liquids until 6:45 AM DAY OF SURGERY  Water Non-Citrus Juices (without pulp, NO RED-Apple, White grape, White cranberry) Black Coffee (NO MILK/CREAM OR CREAMERS, sugar ok)  Clear Tea (NO MILK/CREAM OR CREAMERS, sugar ok) regular and decaf                             Plain Jell-O (NO RED)                                           Fruit ices (not with fruit pulp, NO RED)                                     Popsicles (NO RED)                                                               Sports drinks like Gatorade (NO RED)                 The day of surgery:  Drink ONE (1) Pre-Surgery Clear Ensure at 6:45 AM the morning of surgery. Drink in one sitting. Do not sip.  This drink was given to you during your hospital  pre-op appointment visit. Nothing else to drink after completing the  Pre-Surgery Clear Ensure.           If you have questions, please contact your surgeon's office.   FOLLOW BOWEL PREP AND ANY ADDITIONAL PRE OP INSTRUCTIONS YOU RECEIVED FROM YOUR SURGEON'S OFFICE!!!     Oral Hygiene is also important to reduce your risk of infection.                                    Remember - BRUSH YOUR TEETH THE MORNING OF SURGERY WITH YOUR REGULAR TOOTHPASTE  DENTURES WILL  BE REMOVED PRIOR TO SURGERY PLEASE DO NOT APPLY "Poly grip" OR ADHESIVES!!!   Take these medicines the morning of surgery with A SIP OF WATER: Rosuvastatin                               You may not have any metal on your body including jewelry, and body piercing             Do not wear lotions, powders, cologne, or deodorant  Do not shave  48 hours prior to surgery.               Men may shave face and neck.   Do not bring valuables to the hospital. [place].   Contacts, glasses, dentures or bridgework may not be worn into surgery.  DO NOT [place]. PHARMACY WILL DISPENSE MEDICATIONS LISTED ON YOUR MEDICATION LIST TO YOU DURING YOUR ADMISSION [place]!    Patients discharged on the day of surgery will not be allowed to drive home.  Someone NEEDS to stay with you for the first 24 hours after anesthesia.   Special Instructions: Bring a copy of your healthcare power of attorney and living will documents the day of surgery if you haven't scanned them before.              Please read over the following fact sheets you were given: IF [place] [phone] Schneiders    If you received a COVID test during your pre-op visit  it is requested that you wear a mask when out in public, stay away from anyone that may not be feeling well and notify your surgeon if you develop symptoms. If you test positive for Covid or have been in contact with anyone that has tested positive in the last 10  days please notify you surgeon.    [facility] - Preparing for Surgery Before surgery, you can play an important role.  Because skin is not sterile, your skin needs to be as free of germs as possible.  You can reduce the number of germs on your skin by washing with CHG (chlorahexidine gluconate) soap before surgery.  CHG is an antiseptic cleaner which kills germs and bonds with the skin to continue killing germs even after washing. Please DO NOT use if you have an allergy to CHG or antibacterial soaps.  If your skin becomes reddened/irritated stop using the CHG and inform your nurse when you arrive at Short Stay. Do not shave (including legs and underarms) for at least 48 hours prior to the first CHG shower.  You may shave your face/neck.  Please follow these instructions carefully:  1.  Shower with CHG Soap the night before surgery and the  morning of surgery.  2.  If you choose to wash your hair, wash your hair first as usual with your normal  shampoo.  3.  After you shampoo, rinse your hair and body thoroughly to remove the shampoo.                             4.  Use CHG as you would any other liquid soap.  You can apply chg directly to the skin and wash.  Gently with a scrungie or clean washcloth.  5.  Apply the CHG Soap to your body ONLY FROM THE NECK DOWN.   Do   not use on face/ open                           Wound or open sores. Avoid contact with eyes, ears mouth and   genitals (private parts).                       Wash face,  Genitals (private parts) with your normal soap.             6.  Wash thoroughly, paying special attention to the area where your    surgery  will be performed.  7.  Thoroughly rinse your body with warm water from the neck down.  8.  DO NOT shower/wash with your normal soap after using and rinsing off the CHG Soap.                9.  Pat yourself dry with a clean towel.            10.  Wear clean pajamas.            11.  Place clean sheets on your bed the night of  your first shower and do not  sleep with pets. Day of Surgery : Do not apply any lotions/deodorants the morning of surgery.  Please wear clean clothes to the hospital/surgery center.  FAILURE TO FOLLOW THESE INSTRUCTIONS MAY RESULT IN THE CANCELLATION OF YOUR SURGERY  PATIENT SIGNATURE_________________________________  NURSE SIGNATURE__________________________________  ________________________________________________________________________  Adam Phenix  An incentive spirometer is a tool that can help keep your lungs clear and active. This tool measures how well you are filling your lungs with each breath. Taking long deep breaths may help reverse or decrease the chance of developing breathing (pulmonary) problems (especially infection) following: A long period of time when you are unable to move or be active. BEFORE THE PROCEDURE  If the spirometer includes an indicator to show your best effort, your nurse or respiratory therapist will set it to a desired goal. If possible, sit up straight or lean slightly forward. Try not to slouch. Hold the incentive spirometer in an upright position. INSTRUCTIONS FOR USE  Sit on the edge of your bed if possible, or sit up as far as you can in bed or on a chair. Hold the incentive spirometer in an upright position. Breathe out normally. Place the mouthpiece in your mouth and seal your lips tightly around it. Breathe in slowly and as deeply as possible, raising the piston or the ball toward the top of the column. Hold your breath for 3-5 seconds or for as long as possible. Allow the piston or ball to fall to the bottom of the column. Remove the mouthpiece from your mouth and breathe out normally. Rest for a few seconds and repeat Steps 1 through 7 at least 10 times every 1-2 hours when you are awake. Take your time and take a few normal breaths between deep breaths. The spirometer may include an indicator to show your best effort. Use the  indicator as a goal to work toward during each repetition. After each set of 10 deep breaths, practice coughing to be sure your lungs are clear. If you have an incision (the cut made at the time of surgery), support your incision when coughing by placing  a pillow or rolled up towels firmly against it. Once you are able to get out of bed, walk around indoors and cough well. You may stop using the incentive spirometer when instructed by your caregiver.  RISKS AND COMPLICATIONS Take your time so you do not get dizzy or light-headed. If you are in pain, you may need to take or ask for pain medication before doing incentive spirometry. It is harder to take a deep breath if you are having pain. AFTER USE Rest and breathe slowly and easily. It can be helpful to keep track of a log of your progress. Your caregiver can provide you with a simple table to help with this. If you are using the spirometer at home, follow these instructions: [place] IF:  You are having difficultly using the spirometer. You have trouble using the spirometer as often as instructed. Your pain medication is not giving enough relief while using the spirometer. You develop fever of 100.5 F (38.1 C) or higher. SEEK IMMEDIATE MEDICAL CARE IF:  You cough up bloody sputum that had not been present before. You develop fever of 102 F (38.9 C) or greater. You develop worsening pain at or near the incision site. MAKE SURE YOU:  Understand these instructions. Will watch your condition. Will get help right away if you are not doing well or get worse. Document Released: 04/18/2007 Document Revised: 02/28/2012 Document Reviewed: 06/19/2007 ExitCare Patient Information 2014 Exit[employer], [place].   ________________________________________________________________________ WHAT IS A BLOOD TRANSFUSION? Blood Transfusion Information  A transfusion is the replacement of blood or some of its parts. Blood is made up of multiple cells  which provide different functions. Red blood cells carry oxygen and are used for blood loss replacement. White blood cells fight against infection. Platelets control bleeding. Plasma helps clot blood. Other blood products are available for specialized needs, such as hemophilia or other clotting disorders. BEFORE THE TRANSFUSION  Who gives blood for transfusions?  Healthy volunteers who are fully evaluated to make sure their blood is safe. This is blood bank blood. Transfusion therapy is the safest it has ever been in the practice of medicine. Before blood is taken from a donor, a complete history is taken to make sure that person has no history of diseases nor engages in risky social behavior (examples are intravenous drug use or sexual activity with multiple partners). The donor's travel history is screened to minimize risk of transmitting infections, such as malaria. The donated blood is tested for signs of infectious diseases, such as HIV and hepatitis. The blood is then tested to be sure it is compatible with you in order to minimize the chance of a transfusion reaction. If you or a relative donates blood, this is often done in anticipation of surgery and is not appropriate for emergency situations. It takes many days to process the donated blood. RISKS AND COMPLICATIONS Although transfusion therapy is very safe and saves many lives, the main dangers of transfusion include:  Getting an infectious disease. Developing a transfusion reaction. This is an allergic reaction to something in the blood you were given. Every precaution is taken to prevent this. The decision to have a blood transfusion has been considered carefully by your caregiver before blood is given. Blood is not given unless the benefits outweigh the risks. AFTER THE TRANSFUSION Right after receiving a blood transfusion, you will usually feel much better and more energetic. This is especially true if your red blood cells have gotten  low (anemic). The transfusion  raises the level of the red blood cells which carry oxygen, and this usually causes an energy increase. The nurse administering the transfusion will monitor you carefully for complications. HOME CARE INSTRUCTIONS  No special instructions are needed after a transfusion. You may find your energy is better. Speak with your caregiver about any limitations on activity for underlying diseases you may have. SEEK MEDICAL CARE IF:  Your condition is not improving after your transfusion. You develop redness or irritation at the intravenous (IV) site. SEEK IMMEDIATE MEDICAL CARE IF:  Any of the following symptoms occur over the next 12 hours: Shaking chills. You have a temperature by mouth above 102 F (38.9 C), not controlled by medicine. Chest, back, or muscle pain. People around you feel you are not acting correctly or are confused. Shortness of breath or difficulty breathing. Dizziness and fainting. You get a rash or develop hives. You have a decrease in urine output. Your urine turns a dark color or changes to pink, red, or brown. Any of the following symptoms occur over the next 10 days: You have a temperature by mouth above 102 F (38.9 C), not controlled by medicine. Shortness of breath. Weakness after normal activity. The white part of the eye turns yellow (jaundice). You have a decrease in the amount of urine or are urinating less often. Your urine turns a dark color or changes to pink, red, or brown. Document Released: 12/03/2000 Document Revised: 02/28/2012 Document Reviewed: 07/22/2008 [facility] Patient Information 2014 [place], [place].  _______________________________________________________________________

## 2023-03-11 NOTE — Progress Notes (Signed)
COVID Vaccine Completed: yes  Date of COVID positive in last 90 days:  PCP - Lona Kettle, MD Cardiologist - Candee Furbish, MD LOV 10/14/22  Cardiac clearance?  Chest x-ray - 11/24/22 Epic EKG - 12/29/22 [facility] req Stress Test -  ECHO - 12/29/22 [facility] Cardiac Cath -  Pacemaker/ICD device last checked: Spinal Cord Stimulator:  Bowel Prep -   Sleep Study -  CPAP -   Fasting Blood Sugar -  Checks Blood Sugar _____ times a day  Last dose of GLP1 agonist-  N/A GLP1 instructions:  N/A   Last dose of SGLT-2 inhibitors-  N/A SGLT-2 instructions: N/A   Blood Thinner Instructions: Aspirin Instructions: Last Dose:  Activity level:  Can go up a flight of stairs and perform activities of daily living without stopping and without symptoms of chest pain or shortness of breath.  Able to exercise without symptoms  Unable to go up a flight of stairs without symptoms of     Anesthesia review: HTN, OSA, aortic regurgitation, endocarditis, heart murmur, PSVT  Patient denies shortness of breath, fever, cough and chest pain at PAT appointment  Patient verbalized understanding of instructions that were given to them at the PAT appointment. Patient was also instructed that they will need to review over the PAT instructions again at home before surgery.

## 2023-03-14 ENCOUNTER — Encounter (HOSPITAL_COMMUNITY)
Admission: RE | Admit: 2023-03-14 | Discharge: 2023-03-14 | Disposition: A | Discharge status: Home / Self Care | Payer: Medicare Other | Source: Ambulatory Visit | Attending: Orthopedic Surgery | Admitting: Orthopedic Surgery

## 2023-03-14 ENCOUNTER — Other Ambulatory Visit: Payer: Self-pay

## 2023-03-14 ENCOUNTER — Encounter (HOSPITAL_COMMUNITY): Payer: Self-pay

## 2023-03-14 VITALS — BP 150/59 | HR 67 | Temp 97.8°F | Resp 14 | Ht 68.0 in | Wt 187.0 lb

## 2023-03-14 DIAGNOSIS — Z01818 Encounter for other preprocedural examination: Secondary | ICD-10-CM | POA: Insufficient documentation

## 2023-03-14 DIAGNOSIS — Z01812 Encounter for preprocedural laboratory examination: Secondary | ICD-10-CM | POA: Insufficient documentation

## 2023-03-14 DIAGNOSIS — M25551 Pain in right hip: Secondary | ICD-10-CM | POA: Insufficient documentation

## 2023-03-14 DIAGNOSIS — M1611 Unilateral primary osteoarthritis, right hip: Secondary | ICD-10-CM | POA: Diagnosis not present

## 2023-03-14 HISTORY — DX: Chronic obstructive pulmonary disease, unspecified: J44.9

## 2023-03-14 HISTORY — DX: Unspecified osteoarthritis, unspecified site: M19.90

## 2023-03-14 LAB — COMPREHENSIVE METABOLIC PANEL
ALT: 21 U/L (ref 0–44)
AST: 20 U/L (ref 15–41)
Albumin: 3.9 g/dL (ref 3.5–5.0)
Alkaline Phosphatase: 42 U/L (ref 38–126)
Anion gap: 9 (ref 5–15)
BUN: 21 mg/dL (ref 8–23)
CO2: 25 mmol/L (ref 22–32)
Calcium: 8.8 mg/dL — ABNORMAL LOW (ref 8.9–10.3)
Chloride: 106 mmol/L (ref 98–111)
Creatinine, Ser: 0.92 mg/dL (ref 0.61–1.24)
GFR, Estimated: >60 mL/min (ref >60)
Glucose, Bld: 105 mg/dL — ABNORMAL HIGH (ref 70–99)
Potassium: 3.9 mmol/L (ref 3.5–5.1)
Sodium: 140 mmol/L (ref 135–145)
Total Bilirubin: 0.7 mg/dL (ref 0.3–1.2)
Total Protein: 6.5 g/dL (ref 6.5–8.1)

## 2023-03-14 LAB — CBC WITH DIFFERENTIAL/PLATELET
Abs Immature Granulocytes: 0.01 10*3/uL (ref 0.00–0.07)
Basophils Absolute: 0 10*3/uL (ref 0.0–0.1)
Basophils Relative: 1 %
Eosinophils Absolute: 0 10*3/uL (ref 0.0–0.5)
Eosinophils Relative: 1 %
HCT: 42.7 % (ref 39.0–52.0)
Hemoglobin: 13.9 g/dL (ref 13.0–17.0)
Immature Granulocytes: 0 %
Lymphocytes Relative: 20 %
Lymphs Abs: 1.3 10*3/uL (ref 0.7–4.0)
MCH: 29.8 pg (ref 26.0–34.0)
MCHC: 32.6 g/dL (ref 30.0–36.0)
MCV: 91.4 fL (ref 80.0–100.0)
Monocytes Absolute: 0.5 10*3/uL (ref 0.1–1.0)
Monocytes Relative: 8 %
Neutro Abs: 4.7 10*3/uL (ref 1.7–7.7)
Neutrophils Relative %: 70 %
Platelets: 176 10*3/uL (ref 150–400)
RBC: 4.67 MIL/uL (ref 4.22–5.81)
RDW: 14.5 % (ref 11.5–15.5)
WBC: 6.6 10*3/uL (ref 4.0–10.5)
nRBC: 0 % (ref 0.0–0.2)

## 2023-03-14 LAB — TYPE AND SCREEN
ABO/RH(D): A NEG
Antibody Screen: NEGATIVE

## 2023-03-14 LAB — SURGICAL PCR SCREEN
MRSA, PCR: NEGATIVE
Staphylococcus aureus: NEGATIVE

## 2023-03-15 NOTE — Anesthesia Preprocedure Evaluation (Addendum)
Anesthesia Evaluation  Patient identified by MRN, date of birth, ID band Patient awake    Reviewed: Allergy & Precautions, H&P , NPO status , Patient's Chart, lab work & pertinent test results  Airway Mallampati: II  TM Distance: >3 FB Neck ROM: Full    Dental no notable dental hx.    Pulmonary sleep apnea , COPD   Pulmonary exam normal breath sounds clear to auscultation       Cardiovascular hypertension,  Rhythm:Regular Rate:Normal + Systolic murmurs  Left Ventricle: Left ventricular ejection fraction, by estimation, is 60  to 65%. The left ventricle has normal function. The left ventricle has no  regional wall motion abnormalities. The left ventricular internal cavity  size was normal in size. There is   mild concentric left ventricular hypertrophy. Left ventricular diastolic  parameters are consistent with Grade II diastolic dysfunction  (pseudonormalization).   Right Ventricle: The right ventricular size is normal. Right ventricular  systolic function is normal.   Left Atrium: Left atrial size was normal in size.   Right Atrium: Right atrial size was normal in size.   Pericardium: There is no evidence of pericardial effusion.   Mitral Valve: The mitral valve is normal in structure. No evidence of  mitral valve regurgitation. No evidence of mitral valve stenosis.   Tricuspid Valve: The tricuspid valve is normal in structure. Tricuspid  valve regurgitation is mild . No evidence of tricuspid stenosis.   Aortic Valve: The aortic valve is tricuspid. Aortic valve regurgitation is  moderate to severe. Aortic regurgitation PHT measures 539 msec. Aortic  valve sclerosis/calcification is present, without any evidence of aortic  stenosis.   Pulmonic Valve: The pulmonic valve was normal in structure. Pulmonic valve  regurgitation is moderate. No evidence of pulmonic stenosis.   Aorta: The aortic root is normal in size and  structure. There is mild  dilatation of the ascending aorta, measuring 40 mm.   Venous: The inferior vena cava is normal in size with greater than 50%  respiratory variability, suggesting right atrial pressure of 3 mmHg.     Neuro/Psych negative neurological ROS  negative psych ROS   GI/Hepatic Neg liver ROS,GERD  ,,  Endo/Other  negative endocrine ROS    Renal/GU negative Renal ROS  negative genitourinary   Musculoskeletal negative musculoskeletal ROS (+)    Abdominal   Peds negative pediatric ROS (+)  Hematology negative hematology ROS (+)   Anesthesia Other Findings   Reproductive/Obstetrics negative OB ROS                             Anesthesia Physical Anesthesia Plan  ASA: 3  Anesthesia Plan: Spinal   Post-op Pain Management:    Induction: Intravenous  PONV Risk Score and Plan: 1 and Propofol infusion and Treatment may vary due to age or medical condition  Airway Management Planned: Simple Face Mask  Additional Equipment:   Intra-op Plan:   Post-operative Plan:   Informed Consent: I have reviewed the patients History and Physical, chart, labs and discussed the procedure including the risks, benefits and alternatives for the proposed anesthesia with the patient or authorized representative who has indicated his/her understanding and acceptance.     Dental advisory given  Plan Discussed with: CRNA and Surgeon  Anesthesia Plan Comments: (See PAT note 03/14/2023)       Anesthesia Quick Evaluation

## 2023-03-15 NOTE — Progress Notes (Addendum)
Anesthesia Chart Review   Case: [id number] Date/Time: 03/23/23 0930   Procedure: TOTAL HIP ARTHROPLASTY (Right: Hip)   Anesthesia type: Spinal   Pre-op diagnosis: OA RIGHT HIP   Location: [facility] ROOM 08 / [facility]   Surgeons: Willaim Sheng, MD       DISCUSSION:76 y.o. never smoker with h/o HTN, sleep apnea, COPD, CHF, bicuspid AoV with AR, bacterial endocarditis (2009, recurrent 2014), right total hip scheduled for above procedure 03/23/2023 with Dr. Charlies Constable.   Pt last seen by cardiology 12/21/2022, at this time complaining of chronic cough.  Echo updated at that time, no changes, severe AR without stenosis. Cardiac etiology of cough ruled out referred to pulmonology.   Cardiac clearance received, on chart. Per clearance pt is optimized from cardiac standpoint at moderate risk.   Pt seen by pulmonology, likely obstructive disease, started on Breztri which helped with cough. Last seen by pulmonology 01/31/2023, cough significantly improved.   Anticipate pt can proceed with planned procedure barring acute status change.   VS: BP (!) 150/59   Pulse 67   Temp 36.6 C (Oral)   Resp 14   Ht 5\' 8"  (1.727 m)   Wt 84.8 kg   SpO2 99%   BMI 28.43 kg/m   PROVIDERS: Lawerance Cruel, MD is PCP  Cardiologist - Sheila Oats, Glenard Haring, MD with [facility]   Pulmonologist- Leslye Peer, MD  LABS: Labs reviewed: Acceptable for surgery. (all labs ordered are listed, but only abnormal results are displayed)  Labs Reviewed  COMPREHENSIVE METABOLIC PANEL - Abnormal; Notable for the following components:      Result Value   Glucose, Bld 105 (*)    Calcium 8.8 (*)    All other components within normal limits  SURGICAL PCR SCREEN  CBC WITH DIFFERENTIAL/PLATELET  TYPE AND SCREEN     IMAGES:   EKG:   CV: Echo 12/29/2022 INTERPRETATION ---------------------------------------------------------------    NORMAL LEFT VENTRICULAR SYSTOLIC FUNCTION    NORMAL RIGHT VENTRICULAR SYSTOLIC  FUNCTION    VALVULAR REGURGITATION: SEVERE AR, TRIVIAL MR, MODERATE PR, TRIVIAL TR    NO VALVULAR STENOSIS    MODERATE TO SEVERE AR WITH HOLODIASTOLIC FLOW REVERSAL IN DESCENDING AORTA    MILDLY DILATED AORTIC ROOT AND ASCENDING AORTA   Echo 10/08/2022 . Moderate to severe AI; suggest TEE to further assess.   2. Left ventricular ejection fraction, by estimation, is 60 to 65%. The  left ventricle has normal function. The left ventricle has no regional  wall motion abnormalities. There is mild concentric left ventricular  hypertrophy. Left ventricular diastolic  parameters are consistent with Grade II diastolic dysfunction  (pseudonormalization).   3. Right ventricular systolic function is normal. The right ventricular  size is normal.   4. The mitral valve is normal in structure. No evidence of mitral valve  regurgitation. No evidence of mitral stenosis.   5. The aortic valve is tricuspid. Aortic valve regurgitation is moderate  to severe. Aortic valve sclerosis/calcification is present, without any  evidence of aortic stenosis.   6. Pulmonic valve regurgitation is moderate.   7. There is mild dilatation of the ascending aorta, measuring 40 mm.   8. The inferior vena cava is normal in size with greater than 50%  respiratory variability, suggesting right atrial pressure of 3 mmHg.  Past Medical History:  Diagnosis Date   Aortic root dilatation ([place]) 04/08/2016   a.) TTE 04/08/2016: Ao root measured 39 mm; b.) TTE 01/12/2018: Ao root measured 38 mm; c.) TTE  06/20/2020: Ao root measured 40 mm, asc Ao 39 mm   Aortic valve regurgitation    Arthritis    Bilateral cataracts    a.) s/p extraction with IOL placement   COPD (chronic obstructive pulmonary disease) (HCC)    Diastolic dysfunction    a.) TTE 04/08/2016: EF 55-60%, triv TR, mod AR, atrial septal aneurysm, G1DD; b.) TTE 01/12/2018: EF 60-65%, mild-mod AoV calcification, mod-sev AR, G1DD; c.) TTE 06/20/2020: EF 65-70%, sev LVH, mild  AoV sclerosis with mod AR, triv MR; d.) TTE 03/02/2021: EF >55%, mild LVH, GLS -16.0%, MAC/TAC, trivial TR, moderate AR/PR.   Endocarditis 08/17/2013   Possible vegetation associated with tricuspid valve (seen on post tx echo as well)    Endocarditis 2009   a.) while living in [place]   Enlarged prostate    GERD (gastroesophageal reflux disease)    Heart murmur    "think I have one now" (08/16/2013)   High cholesterol    Hypertension    PSVT (paroxysmal supraventricular tachycardia) 01/18/2022   a.) Holter 01/18/2022: 16 SVT runs with fastest interval lasting 7 beats with a max rate of 222 bpm, longest lasted 17 beats at a rate of 125 bpm   Recurrent pneumonia    "almost once/yr" (08/16/2013)   Sleep apnea    "don't wear mask" (08/16/2013)   Squamous cell carcinoma of skin of scalp    Umbilical hernia    a.) s/p repair in approx 2011    Past Surgical History:  Procedure Laterality Date   CATARACT EXTRACTION W/ INTRAOCULAR LENS IMPLANT Right 07/05/2022   Procedure: CATARACT EXTRACTION W/ INTRAOCULAR LENS IMPLANT; Location: [facility]   CATARACT EXTRACTION W/ INTRAOCULAR LENS IMPLANT Left 07/19/2022   Procedure: CATARACT EXTRACTION W/ INTRAOCULAR LENS IMPLANT; Location: [facility]   HOLEP-LASER ENUCLEATION OF THE PROSTATE WITH MORCELLATION N/A 07/30/2022   Procedure: HOLEP-LASER ENUCLEATION OF THE PROSTATE WITH MORCELLATION;  Surgeon: Billey Co, MD;  Location: [facility] ORS;  Service: Urology;  Laterality: N/A;   INGUINAL HERNIA REPAIR Right ~ 1950   TEE WITHOUT CARDIOVERSION N/A 08/17/2013   Procedure: TRANSESOPHAGEAL ECHOCARDIOGRAM (TEE);  Surgeon: Candee Furbish, MD;  Location: [facility] ENDOSCOPY;  Service: Cardiovascular;  Laterality: N/A;   TONSILLECTOMY  ~ [id number]   UMBILICAL HERNIA REPAIR  ~ 2011    MEDICATIONS:  Budeson-Glycopyrrol-Formoterol (BREZTRI AEROSPHERE) 160-9-4.8 MCG/ACT AERO   rosuvastatin (CRESTOR) 10 MG tablet   telmisartan (MICARDIS) 40 MG tablet   No current  facility-administered medications for this encounter.     [name], PA-C [facility] Pre-Surgical Testing [phone]

## 2023-03-23 ENCOUNTER — Ambulatory Visit (HOSPITAL_COMMUNITY): Payer: Medicare Other

## 2023-03-23 ENCOUNTER — Ambulatory Visit (HOSPITAL_COMMUNITY): Payer: Medicare Other | Admitting: Physician Assistant

## 2023-03-23 ENCOUNTER — Encounter (HOSPITAL_COMMUNITY): Payer: Self-pay | Admitting: Orthopedic Surgery

## 2023-03-23 ENCOUNTER — Encounter (HOSPITAL_COMMUNITY)
Admission: RE | Disposition: A | Discharge status: Home / Self Care | Payer: Self-pay | Source: Ambulatory Visit | Attending: Orthopedic Surgery

## 2023-03-23 ENCOUNTER — Ambulatory Visit (HOSPITAL_BASED_OUTPATIENT_CLINIC_OR_DEPARTMENT_OTHER): Payer: Medicare Other | Admitting: Anesthesiology

## 2023-03-23 ENCOUNTER — Other Ambulatory Visit: Payer: Self-pay

## 2023-03-23 ENCOUNTER — Ambulatory Visit (HOSPITAL_COMMUNITY)
Admission: RE | Admit: 2023-03-23 | Discharge: 2023-03-23 | Disposition: A | Discharge status: Home / Self Care | Payer: Medicare Other | Source: Ambulatory Visit | Attending: Orthopedic Surgery | Admitting: Orthopedic Surgery

## 2023-03-23 DIAGNOSIS — G4733 Obstructive sleep apnea (adult) (pediatric): Secondary | ICD-10-CM | POA: Diagnosis not present

## 2023-03-23 DIAGNOSIS — J449 Chronic obstructive pulmonary disease, unspecified: Secondary | ICD-10-CM | POA: Diagnosis not present

## 2023-03-23 DIAGNOSIS — M1611 Unilateral primary osteoarthritis, right hip: Secondary | ICD-10-CM

## 2023-03-23 DIAGNOSIS — G4730 Sleep apnea, unspecified: Secondary | ICD-10-CM

## 2023-03-23 DIAGNOSIS — I10 Essential (primary) hypertension: Secondary | ICD-10-CM | POA: Insufficient documentation

## 2023-03-23 HISTORY — PX: TOTAL HIP ARTHROPLASTY: SHX124

## 2023-03-23 LAB — ABO/RH: ABO/RH(D): A NEG

## 2023-03-23 SURGERY — ARTHROPLASTY, HIP, TOTAL,POSTERIOR APPROACH
Anesthesia: Spinal | Site: Hip | Laterality: Right

## 2023-03-23 MED ORDER — OXYCODONE HCL 5 MG/5ML PO SOLN: 5.0000 mg | Freq: Once | ORAL | Status: DC | PRN

## 2023-03-23 MED ORDER — ACETAMINOPHEN 500 MG PO TABS
ORAL_TABLET | ORAL | Status: AC
  Filled 2023-03-23: qty 2

## 2023-03-23 MED ORDER — TRANEXAMIC ACID-NACL 1000-0.7 MG/100ML-% IV SOLN
1000.0000 mg | INTRAVENOUS | Status: AC
  Administered 2023-03-23: 1000 mg via INTRAVENOUS
  Filled 2023-03-23: qty 100

## 2023-03-23 MED ORDER — ISOPROPYL ALCOHOL 70 % SOLN
Status: DC | PRN
  Administered 2023-03-23: 1 via TOPICAL

## 2023-03-23 MED ORDER — FENTANYL CITRATE (PF) 100 MCG/2ML IJ SOLN
INTRAMUSCULAR | Status: AC
  Filled 2023-03-23: qty 2

## 2023-03-23 MED ORDER — OXYCODONE HCL 5 MG PO TABS
5.0000 mg | ORAL_TABLET | ORAL | Status: DC | PRN
  Administered 2023-03-23 (×2): 5 mg via ORAL

## 2023-03-23 MED ORDER — EPHEDRINE 5 MG/ML INJ
INTRAVENOUS | Status: AC
  Filled 2023-03-23: qty 5

## 2023-03-23 MED ORDER — SODIUM CHLORIDE (PF) 0.9 % IJ SOLN
INTRAMUSCULAR | Status: AC
  Filled 2023-03-23: qty 10

## 2023-03-23 MED ORDER — ACETAMINOPHEN 500 MG PO TABS
1000.0000 mg | ORAL_TABLET | Freq: Once | ORAL | Status: AC
  Administered 2023-03-23: 1000 mg via ORAL
  Filled 2023-03-23: qty 2

## 2023-03-23 MED ORDER — CEFAZOLIN SODIUM-DEXTROSE 2-4 GM/100ML-% IV SOLN
2.0000 g | Freq: Four times a day (QID) | INTRAVENOUS | Status: DC
  Administered 2023-03-23: 2 g via INTRAVENOUS

## 2023-03-23 MED ORDER — ONDANSETRON HCL 4 MG/2ML IJ SOLN
INTRAMUSCULAR | Status: DC | PRN
  Administered 2023-03-23: 4 mg via INTRAVENOUS

## 2023-03-23 MED ORDER — BUPIVACAINE LIPOSOME 1.3 % IJ SUSP: 10.0000 mL | Freq: Once | INTRAMUSCULAR | Status: DC

## 2023-03-23 MED ORDER — PROPOFOL 500 MG/50ML IV EMUL
INTRAVENOUS | Status: DC | PRN
  Administered 2023-03-23: 100 ug/kg/min via INTRAVENOUS

## 2023-03-23 MED ORDER — BUPIVACAINE LIPOSOME 1.3 % IJ SUSP
INTRAMUSCULAR | Status: DC | PRN
  Administered 2023-03-23: 20 mL

## 2023-03-23 MED ORDER — OXYCODONE HCL 5 MG PO TABS: 5.0000 mg | ORAL_TABLET | ORAL | 0 refills | Status: AC | PRN

## 2023-03-23 MED ORDER — FENTANYL CITRATE (PF) 100 MCG/2ML IJ SOLN
INTRAMUSCULAR | Status: DC | PRN
  Administered 2023-03-23 (×2): 50 ug via INTRAVENOUS

## 2023-03-23 MED ORDER — BUPIVACAINE IN DEXTROSE 0.75-8.25 % IT SOLN
INTRATHECAL | Status: DC | PRN
  Administered 2023-03-23: 1.8 mL via INTRATHECAL

## 2023-03-23 MED ORDER — FENTANYL CITRATE PF 50 MCG/ML IJ SOSY: 25.0000 ug | PREFILLED_SYRINGE | INTRAMUSCULAR | Status: DC | PRN

## 2023-03-23 MED ORDER — HYDROMORPHONE HCL 1 MG/ML IJ SOLN: 0.5000 mg | INTRAMUSCULAR | Status: DC | PRN

## 2023-03-23 MED ORDER — ACETAMINOPHEN 500 MG PO TABS
1000.0000 mg | ORAL_TABLET | Freq: Four times a day (QID) | ORAL | Status: DC
  Administered 2023-03-23: 1000 mg via ORAL

## 2023-03-23 MED ORDER — OXYCODONE HCL 5 MG PO TABS: 5.0000 mg | ORAL_TABLET | Freq: Once | ORAL | Status: DC | PRN

## 2023-03-23 MED ORDER — SODIUM CHLORIDE (PF) 0.9 % IJ SOLN
INTRAMUSCULAR | Status: DC | PRN
  Administered 2023-03-23: 60 mL

## 2023-03-23 MED ORDER — PROPOFOL 1000 MG/100ML IV EMUL
INTRAVENOUS | Status: AC
  Filled 2023-03-23: qty 100

## 2023-03-23 MED ORDER — METHOCARBAMOL 500 MG PO TABS: 500.0000 mg | ORAL_TABLET | Freq: Three times a day (TID) | ORAL | 0 refills | Status: AC | PRN

## 2023-03-23 MED ORDER — KETOROLAC TROMETHAMINE 15 MG/ML IJ SOLN
INTRAMUSCULAR | Status: AC
  Filled 2023-03-23: qty 1

## 2023-03-23 MED ORDER — ORAL CARE MOUTH RINSE: 15.0000 mL | Freq: Once | OROMUCOSAL | Status: AC

## 2023-03-23 MED ORDER — PROPOFOL 10 MG/ML IV BOLUS
INTRAVENOUS | Status: DC | PRN
  Administered 2023-03-23 (×2): 20 mg via INTRAVENOUS
  Administered 2023-03-23: 10 mg via INTRAVENOUS

## 2023-03-23 MED ORDER — CEFAZOLIN SODIUM-DEXTROSE 2-4 GM/100ML-% IV SOLN
INTRAVENOUS | Status: AC
  Filled 2023-03-23: qty 100

## 2023-03-23 MED ORDER — CHLORHEXIDINE GLUCONATE 0.12 % MT SOLN
15.0000 mL | Freq: Once | OROMUCOSAL | Status: AC
  Administered 2023-03-23: 15 mL via OROMUCOSAL

## 2023-03-23 MED ORDER — SODIUM CHLORIDE 0.9 % IR SOLN
Status: DC | PRN
  Administered 2023-03-23: 3000 mL

## 2023-03-23 MED ORDER — OXYCODONE HCL 5 MG PO TABS
ORAL_TABLET | ORAL | Status: AC
  Filled 2023-03-23: qty 1

## 2023-03-23 MED ORDER — PHENYLEPHRINE HCL-NACL 20-0.9 MG/250ML-% IV SOLN
INTRAVENOUS | Status: AC
  Filled 2023-03-23: qty 250

## 2023-03-23 MED ORDER — EPHEDRINE SULFATE-NACL 50-0.9 MG/10ML-% IV SOSY
PREFILLED_SYRINGE | INTRAVENOUS | Status: DC | PRN
  Administered 2023-03-23: 10 mg via INTRAVENOUS

## 2023-03-23 MED ORDER — 0.9 % SODIUM CHLORIDE (POUR BTL) OPTIME
TOPICAL | Status: DC | PRN
  Administered 2023-03-23: 1000 mL

## 2023-03-23 MED ORDER — POVIDONE-IODINE 10 % EX SWAB
2.0000 | Freq: Once | CUTANEOUS | Status: AC
  Administered 2023-03-23: 2 via TOPICAL

## 2023-03-23 MED ORDER — METHOCARBAMOL 500 MG PO TABS: 500.0000 mg | ORAL_TABLET | Freq: Four times a day (QID) | ORAL | Status: DC | PRN

## 2023-03-23 MED ORDER — SODIUM CHLORIDE 0.9 % IV SOLN: INTRAVENOUS | Status: DC

## 2023-03-23 MED ORDER — CELECOXIB 100 MG PO CAPS: 100.0000 mg | ORAL_CAPSULE | Freq: Two times a day (BID) | ORAL | 0 refills | Status: AC

## 2023-03-23 MED ORDER — LACTATED RINGERS IV BOLUS: 500.0000 mL | Freq: Once | INTRAVENOUS | Status: DC

## 2023-03-23 MED ORDER — ASPIRIN 81 MG PO TBEC: 81.0000 mg | DELAYED_RELEASE_TABLET | Freq: Two times a day (BID) | ORAL | 0 refills | Status: AC

## 2023-03-23 MED ORDER — WATER FOR IRRIGATION, STERILE IR SOLN
Status: DC | PRN
  Administered 2023-03-23: 2000 mL

## 2023-03-23 MED ORDER — ACETAMINOPHEN 325 MG PO TABS: 325.0000 mg | ORAL_TABLET | Freq: Four times a day (QID) | ORAL | Status: DC | PRN

## 2023-03-23 MED ORDER — METHOCARBAMOL 500 MG IVPB - SIMPLE MED
500.0000 mg | Freq: Four times a day (QID) | INTRAVENOUS | Status: DC | PRN
  Administered 2023-03-23: 500 mg via INTRAVENOUS
  Filled 2023-03-23: qty 55

## 2023-03-23 MED ORDER — ONDANSETRON HCL 4 MG/2ML IJ SOLN: 4.0000 mg | Freq: Once | INTRAMUSCULAR | Status: DC | PRN

## 2023-03-23 MED ORDER — DEXAMETHASONE SODIUM PHOSPHATE 10 MG/ML IJ SOLN
8.0000 mg | Freq: Once | INTRAMUSCULAR | Status: AC
  Administered 2023-03-23: 8 mg via INTRAVENOUS

## 2023-03-23 MED ORDER — SODIUM CHLORIDE (PF) 0.9 % IJ SOLN
INTRAMUSCULAR | Status: AC
  Filled 2023-03-23: qty 50

## 2023-03-23 MED ORDER — PHENYLEPHRINE HCL-NACL 20-0.9 MG/250ML-% IV SOLN
INTRAVENOUS | Status: DC | PRN
  Administered 2023-03-23: 50 ug/min via INTRAVENOUS

## 2023-03-23 MED ORDER — ONDANSETRON HCL 4 MG/2ML IJ SOLN
INTRAMUSCULAR | Status: AC
  Filled 2023-03-23: qty 2

## 2023-03-23 MED ORDER — CEFAZOLIN SODIUM-DEXTROSE 2-4 GM/100ML-% IV SOLN
2.0000 g | INTRAVENOUS | Status: AC
  Administered 2023-03-23: 2 g via INTRAVENOUS
  Filled 2023-03-23: qty 100

## 2023-03-23 MED ORDER — BUPIVACAINE LIPOSOME 1.3 % IJ SUSP
INTRAMUSCULAR | Status: AC
  Filled 2023-03-23: qty 20

## 2023-03-23 MED ORDER — PROPOFOL 500 MG/50ML IV EMUL
INTRAVENOUS | Status: AC
  Filled 2023-03-23: qty 50

## 2023-03-23 MED ORDER — ONDANSETRON HCL 4 MG PO TABS: 4.0000 mg | ORAL_TABLET | Freq: Four times a day (QID) | ORAL | Status: DC | PRN

## 2023-03-23 MED ORDER — ONDANSETRON HCL 4 MG/2ML IJ SOLN: 4.0000 mg | Freq: Four times a day (QID) | INTRAMUSCULAR | Status: DC | PRN

## 2023-03-23 MED ORDER — ONDANSETRON HCL 4 MG PO TABS: 4.0000 mg | ORAL_TABLET | Freq: Three times a day (TID) | ORAL | 0 refills | Status: AC | PRN

## 2023-03-23 MED ORDER — METHOCARBAMOL 500 MG IVPB - SIMPLE MED
INTRAVENOUS | Status: AC
  Filled 2023-03-23: qty 55

## 2023-03-23 MED ORDER — LACTATED RINGERS IV BOLUS
250.0000 mL | Freq: Once | INTRAVENOUS | Status: AC
  Administered 2023-03-23: 250 mL via INTRAVENOUS

## 2023-03-23 MED ORDER — LACTATED RINGERS IV SOLN: INTRAVENOUS | Status: DC

## 2023-03-23 MED ORDER — DEXAMETHASONE SODIUM PHOSPHATE 10 MG/ML IJ SOLN
INTRAMUSCULAR | Status: AC
  Filled 2023-03-23: qty 1

## 2023-03-23 MED ORDER — KETOROLAC TROMETHAMINE 15 MG/ML IJ SOLN
7.5000 mg | Freq: Four times a day (QID) | INTRAMUSCULAR | Status: DC
  Administered 2023-03-23: 7.5 mg via INTRAVENOUS

## 2023-03-23 MED ORDER — ACETAMINOPHEN 500 MG PO TABS: 1000.0000 mg | ORAL_TABLET | Freq: Three times a day (TID) | ORAL | 0 refills | Status: AC | PRN

## 2023-03-23 SURGICAL SUPPLY — 74 items
ADH SKN CLS APL DERMABOND .7 (GAUZE/BANDAGES/DRESSINGS) ×1
ADH SKN CLS LQ APL DERMABOND (GAUZE/BANDAGES/DRESSINGS) ×1
APL PRP STRL LF DISP 70% ISPRP (MISCELLANEOUS) ×2
BAG COUNTER SPONGE SURGICOUNT (BAG) IMPLANT
BAG DECANTER FOR FLEXI CONT (MISCELLANEOUS) ×1 IMPLANT
BAG SPEC THK2 15X12 ZIP CLS (MISCELLANEOUS) ×1
BAG SPNG CNTER NS LX DISP (BAG)
BAG ZIPLOCK 12X15 (MISCELLANEOUS) ×1 IMPLANT
BLADE SAW SAG 25X90X1.19 (BLADE) ×1 IMPLANT
CHLORAPREP W/TINT 26 (MISCELLANEOUS) ×2 IMPLANT
COVER SURGICAL LIGHT HANDLE (MISCELLANEOUS) ×1 IMPLANT
DERMABOND ADVANCED .7 DNX12 (GAUZE/BANDAGES/DRESSINGS) ×1 IMPLANT
DERMABOND ADVANCED .7 DNX6 (GAUZE/BANDAGES/DRESSINGS) IMPLANT
DRAPE HIP W/POCKET STRL (MISCELLANEOUS) ×1 IMPLANT
DRAPE INCISE IOBAN 66X45 STRL (DRAPES) ×1 IMPLANT
DRAPE INCISE IOBAN 85X60 (DRAPES) ×1 IMPLANT
DRAPE POUCH INSTRU U-SHP 10X18 (DRAPES) ×1 IMPLANT
DRAPE SHEET LG 3/4 BI-LAMINATE (DRAPES) ×3 IMPLANT
DRAPE SURG 17X11 SM STRL (DRAPES) ×1 IMPLANT
DRAPE U-SHAPE 47X51 STRL (DRAPES) ×2 IMPLANT
DRESSING AQUACEL AG SP 3.5X10 (GAUZE/BANDAGES/DRESSINGS) ×1 IMPLANT
DRSG AQUACEL AG ADV 3.5X10 (GAUZE/BANDAGES/DRESSINGS) IMPLANT
DRSG AQUACEL AG SP 3.5X10 (GAUZE/BANDAGES/DRESSINGS) ×1
ELECT BLADE TIP CTD 4 INCH (ELECTRODE) ×1 IMPLANT
ELECT REM PT RETURN 15FT ADLT (MISCELLANEOUS) ×1 IMPLANT
GLOVE BIO SURGEON STRL SZ 6.5 (GLOVE) ×2 IMPLANT
GLOVE BIOGEL PI IND STRL 6.5 (GLOVE) ×1 IMPLANT
GLOVE BIOGEL PI IND STRL 8 (GLOVE) ×1 IMPLANT
GLOVE SURG ORTHO 8.0 STRL STRW (GLOVE) ×2 IMPLANT
GOWN STRL REUS W/ TWL XL LVL3 (GOWN DISPOSABLE) ×2 IMPLANT
GOWN STRL REUS W/TWL XL LVL3 (GOWN DISPOSABLE) ×2
HANDPIECE INTERPULSE COAX TIP (DISPOSABLE)
HEAD BIOLOX HIP 36/-5 (Joint) IMPLANT
HIP BIOLOX HD 36/-5 (Joint) ×1 IMPLANT
HOLDER FOLEY CATH W/STRAP (MISCELLANEOUS) ×1 IMPLANT
HOOD PEEL AWAY T7 (MISCELLANEOUS) ×3 IMPLANT
INSERT TRIDENT POLY 36 0DEG (Insert) IMPLANT
JET LAVAGE IRRISEPT WOUND (IRRIGATION / IRRIGATOR) ×1
KIT BASIN OR (CUSTOM PROCEDURE TRAY) ×1 IMPLANT
KIT TURNOVER KIT A (KITS) IMPLANT
LAVAGE JET IRRISEPT WOUND (IRRIGATION / IRRIGATOR) IMPLANT
MANIFOLD NEPTUNE II (INSTRUMENTS) ×1 IMPLANT
MARKER SKIN DUAL TIP RULER LAB (MISCELLANEOUS) ×1 IMPLANT
NDL HYPO 22X1.5 SAFETY MO (MISCELLANEOUS) IMPLANT
NEEDLE HYPO 22X1.5 SAFETY MO (MISCELLANEOUS) IMPLANT
NS IRRIG 1000ML POUR BTL (IV SOLUTION) ×1 IMPLANT
PACK TOTAL JOINT (CUSTOM PROCEDURE TRAY) ×1 IMPLANT
PRESSURIZER FEMORAL UNIV (MISCELLANEOUS) IMPLANT
PROTECTOR NERVE ULNAR (MISCELLANEOUS) ×1 IMPLANT
RETRIEVER SUT HEWSON (MISCELLANEOUS) ×1 IMPLANT
SCREW HEX LP 6.5X30 (Screw) IMPLANT
SCREW HEX LP 6.5X35 (Screw) IMPLANT
SEALER BIPOLAR AQUA 6.0 (INSTRUMENTS) ×1 IMPLANT
SET HNDPC FAN SPRY TIP SCT (DISPOSABLE) IMPLANT
SHELL ACETABUL CLUSTER SZ 54 (Shell) IMPLANT
SPIKE FLUID TRANSFER (MISCELLANEOUS) ×3 IMPLANT
STEM 37MM HIP (Hips) IMPLANT
SUCTION FRAZIER HANDLE 12FR (TUBING) ×1
SUCTION TUBE FRAZIER 12FR DISP (TUBING) ×1 IMPLANT
SUT BONE WAX W31G (SUTURE) ×1 IMPLANT
SUT ETHIBOND #5 BRAIDED 30INL (SUTURE) ×1 IMPLANT
SUT MNCRL AB 3-0 PS2 18 (SUTURE) ×1 IMPLANT
SUT STRATAFIX 0 PDS 27 VIOLET (SUTURE) ×1
SUT STRATAFIX PDO 1 14 VIOLET (SUTURE) ×1
SUT STRATFX PDO 1 14 VIOLET (SUTURE) ×1
SUT VIC AB 2-0 CT2 27 (SUTURE) ×2 IMPLANT
SUTURE STRATFX 0 PDS 27 VIOLET (SUTURE) ×1 IMPLANT
SUTURE STRATFX PDO 1 14 VIOLET (SUTURE) ×1 IMPLANT
SYR 20ML LL LF (SYRINGE) ×2 IMPLANT
TOWEL OR 17X26 10 PK STRL BLUE (TOWEL DISPOSABLE) ×1 IMPLANT
TRAY FOLEY MTR SLVR 16FR STAT (SET/KITS/TRAYS/PACK) ×1 IMPLANT
TUBE SUCTION HIGH CAP CLEAR NV (SUCTIONS) ×1 IMPLANT
UNDERPAD 30X36 HEAVY ABSORB (UNDERPADS AND DIAPERS) ×1 IMPLANT
WATER STERILE IRR 1000ML POUR (IV SOLUTION) ×2 IMPLANT

## 2023-03-23 NOTE — Progress Notes (Signed)
Physical Therapy Treatment Patient Details Name: Eric Chambers MRN: [id number] DOB: June 06, 1946 Today's Date: 03/23/2023   History of Present Illness 77 yo male presents to therapy s/p R THA, poteriorlateral on 03/23/2023 due to failure of conservative measures. No posterior hip precautions. Pt PMH includes but is not limited to: hypokalemia, anemia, OSA, aortic regurgitation, HTN and diastolic dysfunction.    PT Comments     Eric Chambers is a 77 y.o. male POD 0 s/p R THA. Patient is now limited by functional impairments (see PT problem list below) and requires min guard for transfers and gait with RW.  Pt reported feeling much better ~ 1 hr and 15 mins s/p eval. Pts wife required to leave and daughter and son in law arrived. Patient was able to ambulate 65  and 50 feet with RW and min guard progressing to close S and cues for safe walker management. Patient and daughter educated on safe sequencing for stair mobility and verbalized safe guarding position for people assisting with mobility. Patient will benefit from continued skilled PT interventions to address impairments and progress towards PLOF. Patient has met mobility goals at adequate level for discharge home with family support and pt stating staring OPPT 4/8; will continue to follow if pt continues acute stay to progress towards Mod I goals. PT assessed BP in sitting and standing with no reports of dizziness with gait and stair training at second tx. Seated 130/54 Standing 121/82  Recommendations for follow up therapy are one component of a multi-disciplinary discharge planning process, led by the attending physician.  Recommendations may be updated based on patient status, additional functional criteria and insurance authorization.  Follow Up Recommendations       Assistance Recommended at Discharge Intermittent Supervision/Assistance  Patient can return home with the following A little help with walking and/or transfers;A little help with  bathing/dressing/bathroom;Assistance with cooking/housework;Assist for transportation;Help with stairs or ramp for entrance   Equipment Recommendations  None recommended by PT (pt reports DME in home setting)    Recommendations for Other Services       Precautions / Restrictions Precautions Precautions: Fall Restrictions Weight Bearing Restrictions: No     Mobility  Bed Mobility Overal bed mobility: Needs Assistance Bed Mobility: Supine to Sit     Supine to sit: Min guard          Transfers Overall transfer level: Needs assistance Equipment used: Rolling walker (2 wheels) Transfers: Sit to/from Stand Sit to Stand: Min guard           General transfer comment: cues for proper UE and AD placement bed and recliner    Ambulation/Gait Ambulation/Gait assistance: Min guard Gait Distance (Feet): 65 Feet Assistive device: Rolling walker (2 wheels) Gait Pattern/deviations: Step-to pattern, Trunk flexed (pt continues to limp)       General Gait Details: step almost through   Stairs Stairs: Yes Stairs assistance: Min guard Stair Management: No rails (HHA) Number of Stairs: 2 General stair comments: 2 steps with B handrails with min guard and then progressed to 2 steps with HHA   Wheelchair Mobility    Modified Rankin (Stroke Patients Only)       Balance Overall balance assessment: Needs assistance Sitting-balance support: Feet supported Sitting balance-Leahy Scale: Fair     Standing balance support: Bilateral upper extremity supported, During functional activity, Reliant on assistive device for balance Standing balance-Leahy Scale: Poor  Cognition Arousal/Alertness: Awake/alert Behavior During Therapy: WFL for tasks assessed/performed Overall Cognitive Status: Within Functional Limits for tasks assessed                                          Exercises Total Joint Exercises Ankle  Circles/Pumps: AROM, Both, 20 reps Quad Sets: AROM, Right, 5 reps Heel Slides: AROM, Right, 5 reps Hip ABduction/ADduction: AROM, Right, 5 reps Long Arc Quad: AROM, Right, 5 reps Knee Flexion: AROM, Right, 5 reps, Standing Marching in Standing: AROM, Right, 5 reps Standing Hip Extension: AROM, Right, 5 reps    General Comments        Pertinent Vitals/Pain Pain Assessment Pain Assessment: 0-10 Pain Score: 3  Pain Location: R hip Pain Descriptors / Indicators: Aching, Constant, Operative site guarding, Discomfort Pain Intervention(s): Limited activity within patient's tolerance, Monitored during session, Premedicated before session, Repositioned, Ice applied    Home Living Family/patient expects to be discharged to:: Private residence Living Arrangements: Spouse/significant other Available Help at Discharge: Family Type of Home: House Home Access: Stairs to enter Entrance Stairs-Rails: None Technical brewer of Steps: 2   Home Layout: One level Home Equipment: Advice worker (2 wheels) Additional Comments: home with daughter    Prior Function            PT Goals (current goals can now be found in the care plan section) Acute Rehab PT Goals Patient Stated Goal: take the limp out of the walking so I can move faster and return to walking for exercise PT Goal Formulation: With patient Time For Goal Achievement: 04/06/23 Potential to Achieve Goals: Good    Frequency    7X/week      PT Plan Current plan remains appropriate    Co-evaluation              AM-PAC PT "6 Clicks" Mobility   Outcome Measure  Help needed turning from your back to your side while in a flat bed without using bedrails?: A Little Help needed moving from lying on your back to sitting on the side of a flat bed without using bedrails?: A Little Help needed moving to and from a bed to a chair (including a wheelchair)?: A Little Help needed standing up from a chair using  your arms (e.g., wheelchair or bedside chair)?: A Little Help needed to walk in hospital room?: A Little Help needed climbing 3-5 steps with a railing? : A Little 6 Click Score: 18    End of Session Equipment Utilized During Treatment: Gait belt Activity Tolerance: Patient tolerated treatment well;No increased pain Patient left: in chair;with call bell/phone within reach;with family/visitor present;with nursing/sitter in room Nurse Communication: Mobility status;Other (comment) (pt progression toward d/c) PT Visit Diagnosis: Unsteadiness on feet (R26.81);Other abnormalities of gait and mobility (R26.89);Muscle weakness (generalized) (M62.81);Pain;Difficulty in walking, not elsewhere classified (R26.2) Pain - Right/Left: Right Pain - part of body: Hip     Time: [id number] PT Time Calculation (min) (ACUTE ONLY): 17 min  Charges:  $Gait Training: 8-22 mins $Therapeutic Exercise: 8-22 mins                     Baird Lyons, PT    Adair Patter 03/23/2023, 4:52 PM

## 2023-03-23 NOTE — Interval H&P Note (Signed)
The patient has been re-examined, and the chart reviewed, and there have been no interval changes to the documented history and physical.    Plan for R THA for R hip OA  The operative side was examined and the patient was confirmed to have sensation to DPN, SPN, TN intact, Motor EHL, ext, flex 5/5, and DP 2+, PT 2+, No significant edema.   The risks, benefits, and alternatives have been discussed at length with patient, and the patient is willing to proceed.  Right hip marked. Consent has been signed.  

## 2023-03-23 NOTE — Transfer of Care (Signed)
Immediate Anesthesia Transfer of Care Note  Patient: Eric Chambers  Procedure(s) Performed: TOTAL HIP ARTHROPLASTY (Right: Hip)  Patient Location: PACU  Anesthesia Type:Spinal  Level of Consciousness: sedated  Airway & Oxygen Therapy: Patient Spontanous Breathing and Patient connected to face mask oxygen  Post-op Assessment: Report given to RN and Post -op Vital signs reviewed and stable  Post vital signs: Reviewed and stable  Last Vitals:  Vitals Value Taken Time  BP    Temp    Pulse 74 03/23/23 1216  Resp 0 03/23/23 1216  SpO2 96 % 03/23/23 1216  Vitals shown include unvalidated device data.  Last Pain:  Vitals:   03/23/23 0758  TempSrc:   PainSc: 2          Complications: No notable events documented.

## 2023-03-23 NOTE — Anesthesia Procedure Notes (Signed)
Spinal  Patient location during procedure: OR Start time: 03/23/2023 9:57 AM End time: 03/23/2023 9:59 AM Reason for block: surgical anesthesia Staffing Performed: resident/CRNA  Anesthesiologist: Myrtie Soman, MD Resident/CRNA: Lind Covert, CRNA Performed by: Lind Covert, CRNA Authorized by: Myrtie Soman, MD   Preanesthetic Checklist Completed: patient identified, IV checked, site marked, risks and benefits discussed, surgical consent, monitors and equipment checked, pre-op evaluation and timeout performed Spinal Block Patient position: sitting Prep: DuraPrep Patient monitoring: heart rate, cardiac monitor, continuous pulse ox and blood pressure Approach: midline Location: L3-4 Injection technique: single-shot Needle Needle type: Pencan  Needle gauge: 24 G Needle length: 10 cm Needle insertion depth: 7 cm Assessment Sensory level: T6 Events: CSF return Additional Notes Timeout performed. Patient in sitting position. L3-4 identified. Cleansed with Duraprep. SAB without difficulty. To R lateral position x 2 minutes then to supine position

## 2023-03-23 NOTE — Op Note (Signed)
03/23/2023  11:53 AM  PATIENT:  Eric Chambers   MRN: [id number]  PRE-OPERATIVE DIAGNOSIS: End-stage right hip osteoarthritis  POST-OPERATIVE DIAGNOSIS:  same  PROCEDURE:  Procedure(s): Right total hip arthroplasty  PREOPERATIVE INDICATIONS:   Eric Chambers is an 77 y.o. male who has a diagnosis of end-stage right hip osteoarthritis and elected for surgical management after failing conservative treatment.  The risks benefits and alternatives were discussed with the patient including but not limited to the risks of nonoperative treatment, versus surgical intervention including infection, bleeding, nerve injury, periprosthetic fracture, the need for revision surgery, dislocation, leg length discrepancy, blood clots, cardiopulmonary complications, morbidity, mortality, among others, and they were willing to proceed.     OPERATIVE REPORT     SURGEON:  Charlies Constable, MD    ASSISTANT: Dorise Bullion, PA-C, (Present throughout the entire procedure,  necessary for completion of procedure in a timely manner, assisting with retraction, instrumentation, and closure)     ANESTHESIA: Spinal  ESTIMATED BLOOD LOSS: 0000000    COMPLICATIONS:  None.      COMPONENTS:   Stryker Trident 2 54 mm acetabular shell, 6.5 hex screws x 2, neutral X3 polyethylene liner, Accolade 2 size 7 stem with 127 degree neck angle, 36-5 ceramic head Implant Name Type Inv. Item Serial No. Manufacturer Lot No. LRB No. Used Action  SHELL ACETABUL CLUSTER SZ 54 - [id number] Shell SHELL ACETABUL CLUSTER SZ [place] [id number] A Right 1 Implanted  SCREW HEX LP 6.5X30 - [id number] Screw SCREW HEX LP 6.5X30  [employer] [id number] Right 1 Implanted  INSERT TRIDENT POLY 36 0DEG - [id number] Insert INSERT TRIDENT POLY 36 0DEG  [employer] [id number] Right 1 Implanted  SCREW HEX LP 6.5X30 - [id number] Screw SCREW HEX LP 6.5X30  [employer] GWDA Right 1 Implanted  SCREW HEX LP 6.5X35 - [id number] Screw  SCREW HEX LP 6.5X35  [employer] GBAH Right 1 Implanted  STEM 37MM HIP - [id number] Hips STEM 37MM HIP  [employer] [id number] A Right 1 Implanted  HIP BIOLOX HD 36/-5 [id number] Joint HIP BIOLOX HD 36/-5  [employer] [id number] Right 1 Implanted    The aquamantis was utilized for this case to help facilitate better hemostasis as patient was felt to be at increased risk of bleeding because of patient size.     PROCEDURE IN DETAIL:  The patient was met in the holding area and  identified.  The appropriate hip was identified and marked at the operative site.  The patient was then transported to the OR  and  placed under anesthesia.  At that point, the patient was  placed in the lateral decubitus position with the operative side up and  secured to the operating room table  and all bony prominences padded. A subaxillary role was also placed.    The operative lower extremity was prepped from the iliac crest to the distal leg.  Sterile draping was performed.  Preoperative antibiotics, 2 gm of ancef,1 gm of Tranexamic Acid, and 8 mg of Decadron administered. Time out was performed prior to incision.      A routine posterolateral approach was utilized via sharp dissection  carried down to the subcutaneous tissue.  Gross bleeders were Bovie coagulated.  The iliotibial band was identified and incised along the length of the skin incision through the glute max fascia.  Charnley retractor was placed with care to protect the sciatic nerve posteriorly.  With the hip internally rotated, the piriformis tendon was identified and released from the  femoral insertion and tagged with a #5 Ethibond.  A capsulotomy was then performed off the femoral insertion and also tagged with a #5 Ethibond.    The femoral neck was exposed, and I resected the femoral neck based on preoperative templating relative to the lesser trochanter.    I then exposed the deep acetabulum, cleared out any tissue including the  ligamentum teres.  After adequate visualization, I excised the labrum.  I then started reaming with a 48 mm reamer, first medializing to the floor of the cotyloid fossa, and then in the position of the cup aiming towards the greater sciatic notch, matching the version of the transverse acetabular ligament and tucked under the anterior wall. I reamed up to 54 mm reamer with good bony bed preparation and a 54 mm cup was chosen.  The real cup was then impacted into place.  Appropriate version and inclination was confirmed clinically matching their bony anatomy, and also with the use of the jig.  I placed 2 screws in the posterior superior quadrant to augment fixation.  A neutral liner was placed and impacted. It was confirmed to be appropriately seated and the acetabular retractors were removed.    I then prepared the proximal femur using the box cutter, Charnley awl, and then sequentially broached starting with 0 up to a size 6.  A trial broach, neck, and head was utilized, and I reduced the hip and it was found to have excellent stability.  There was no impingement with full extension and 90 degrees external rotation.  The hip was stable at the position of sleep and with 90 degrees flexion and 90 degrees of internal rotation.  Leg lengths were also clinically assessed in the lateral position and felt to be equal. Intra-Op flatplate was obtained and confirmed appropriate component positions.  Good fill of the femur with the size 6 broach.  And restoration of leg length and offset. No evidence or concern for fracture.  The size 6 broach was checked again and felt to subside some more.  We calcar planed and worked her way up to a size 7 broach to the same depth.  Given the increased neck length and then trialed with a -5 head ball which had good stability.  A final femoral prosthesis size 7 was selected. I then impacted the real femoral prosthesis into place.I again trialed and selected a 36 -13mm ball. The hip  was then reduced and taken through a range of motion. There was no impingement with full extension and 90 degrees external rotation.  The hip was stable at the position of sleep and with 90 degrees flexion and 90 degrees of internal rotation. Leg lengths were  again assessed and felt to be restored.  We then opened, and I impacted the real head ball into place.  The posterior capsule was then closed with #5 Ethibond.  The piriformis was repaired through the base of the abductor tendon using a [place] suture passer.  I then irrigated the hip copiously with dilute Betadine and with normal saline pulse lavage. Periarticular injection was then performed with Exparel.   We repaired the fascia #1 barbed suture, followed by 0 barbed suture for the subcutaneous fat.  Skin was closed with 2-0 Vicryl and 3-0 Monocryl.  Dermabond and Aquacel dressing were applied. The patient was then awakened and returned to PACU in stable and satisfactory condition.  Leg lengths in the supine position were assessed and felt to be clinically equal. There were no complications.  Post op recs: WB: WBAT RLE, No formal hip precautions Abx: ancef Imaging: PACU pelvis Xray Dressing: Aquacell, keep intact until follow up DVT prophylaxis: Aspirin 81BID starting POD1 Follow up: 2 weeks after surgery for a wound check with Dr. Zachery Dakins at [facility].  Address: [address], [place], [place] [address]  Office Phone: [phone]   Charlies Constable, MD Orthopedic Surgeon

## 2023-03-23 NOTE — Evaluation (Signed)
Physical Therapy Evaluation Patient Details Name: Eric Chambers MRN: [id number] DOB: 1946-11-09 Today's Date: 03/23/2023  History of Present Illness  77 yo male presents to therapy s/p R THA, poteriorlateral on 03/23/2023 due to failure of conservative measures. No posterior hip precautions. Pt PMH includes but is not limited to: hypokalemia, anemia, OSA, aortic regurgitation, HTN and diastolic dysfunction.  Clinical Impression    Eric Chambers is a 77 y.o. male POD 0 s/p R THA. Patient reports IND with mobility at baseline. Patient is now limited by functional impairments (see PT problem list below) and requires min guard and cues for proper UE and AD placement for transfers and gait with RW. Pt provided with pain medication during eval with pain decreasing to 3/10. Patient was able to ambulate 50 feet x 3 with RW and min guard and cues for safe walker management. Wife arrived and HEP reviewed with pt again and wife.  Remaining assessment for steps due to 2 steps to enter and no handrail. PT unable to progress toward step navigation and training due to pt reports of dizziness when amb to steps, BP assessed and with immediate sitting 79/42 and pt remained seated and BP assessed 3 mins later 84/45 with ongoing c/o dizziness.  Patient will benefit from continued skilled PT interventions to address impairments and progress towards PLOF. Patient have not been met pt is not at a safe level for discharge home; PT to return later in PM and will continue to follow if pt continues acute stay to progress towards Mod I goals.      Recommendations for follow up therapy are one component of a multi-disciplinary discharge planning process, led by the attending physician.  Recommendations may be updated based on patient status, additional functional criteria and insurance authorization.  Follow Up Recommendations       Assistance Recommended at Discharge Intermittent Supervision/Assistance  Patient can return home  with the following  A little help with walking and/or transfers;A little help with bathing/dressing/bathroom;Assistance with cooking/housework;Assist for transportation;Help with stairs or ramp for entrance    Equipment Recommendations None recommended by PT (pt reports DME in home setting)  Recommendations for Other Services       Functional Status Assessment Patient has had a recent decline in their functional status and demonstrates the ability to make significant improvements in function in a reasonable and predictable amount of time.     Precautions / Restrictions Precautions Precautions: Fall Restrictions Weight Bearing Restrictions: No      Mobility  Bed Mobility Overal bed mobility: Needs Assistance Bed Mobility: Supine to Sit     Supine to sit: Min guard          Transfers Overall transfer level: Needs assistance Equipment used: Rolling walker (2 wheels) Transfers: Sit to/from Stand Sit to Stand: Min guard           General transfer comment: cues for proper UE and AD placement bed and recliner    Ambulation/Gait Ambulation/Gait assistance: Min guard Gait Distance (Feet): 50 Feet Assistive device: Rolling walker (2 wheels) Gait Pattern/deviations: Step-to pattern, Trunk flexed       General Gait Details: step almost through  Stairs Stairs:  (NT due to pt reported dizziness and subsequent BP asessment with Bp 79/42)          Wheelchair Mobility    Modified Rankin (Stroke Patients Only)       Balance Overall balance assessment: Needs assistance Sitting-balance support: Feet supported Sitting balance-Leahy Scale: Fair  Standing balance support: Bilateral upper extremity supported, During functional activity, Reliant on assistive device for balance Standing balance-Leahy Scale: Poor                               Pertinent Vitals/Pain Pain Assessment Pain Assessment: 0-10 Pain Score: 8  Pain Location: R hip Pain  Descriptors / Indicators: Aching, Constant, Operative site guarding, Discomfort Pain Intervention(s): Limited activity within patient's tolerance, Monitored during session, Premedicated before session, Repositioned, Ice applied    Home Living Family/patient expects to be discharged to:: Private residence Living Arrangements: Spouse/significant other Available Help at Discharge: Family Type of Home: House Home Access: Stairs to enter Entrance Stairs-Rails: None Technical brewer of Steps: 2   Home Layout: One level Home Equipment: Advice worker (2 wheels) Additional Comments: home with daughter    Prior Function Prior Level of Function : Independent/Modified Independent             Mobility Comments: IND with all ADLs self care tasks, IADLs and driving   pt reports having difficulty donning R sock       Hand Dominance        Extremity/Trunk Assessment        Lower Extremity Assessment Lower Extremity Assessment: RLE deficits/detail RLE Deficits / Details: ankle DF/PF 5/5 RLE Sensation: WNL    Cervical / Trunk Assessment Cervical / Trunk Assessment: Normal  Communication   Communication: No difficulties  Cognition Arousal/Alertness: Awake/alert Behavior During Therapy: WFL for tasks assessed/performed Overall Cognitive Status: Within Functional Limits for tasks assessed                                          General Comments      Exercises Total Joint Exercises Ankle Circles/Pumps: AROM, Both, 20 reps Quad Sets: AROM, Right, 5 reps Heel Slides: AROM, Right, 5 reps Hip ABduction/ADduction: AROM, Right, 5 reps Long Arc Quad: AROM, Right, 5 reps Knee Flexion: AROM, Right, 5 reps, Standing Marching in Standing: AROM, Right, 5 reps Standing Hip Extension: AROM, Right, 5 reps   Assessment/Plan    PT Assessment Patient needs continued PT services  PT Problem List Decreased strength;Decreased activity tolerance;Decreased  balance;Decreased range of motion;Decreased mobility;Decreased coordination;Decreased knowledge of use of DME;Pain       PT Treatment Interventions DME instruction;Gait training;Stair training;Functional mobility training;Therapeutic activities;Therapeutic exercise;Balance training;Neuromuscular re-education;Patient/family education;Modalities    PT Goals (Current goals can be found in the Care Plan section)  Acute Rehab PT Goals Patient Stated Goal: take the limp out of the walking so I can move faster and return to walking for exercise PT Goal Formulation: With patient Time For Goal Achievement: 04/06/23 Potential to Achieve Goals: Good    Frequency 7X/week     Co-evaluation               AM-PAC PT "6 Clicks" Mobility  Outcome Measure Help needed turning from your back to your side while in a flat bed without using bedrails?: A Little Help needed moving from lying on your back to sitting on the side of a flat bed without using bedrails?: A Little Help needed moving to and from a bed to a chair (including a wheelchair)?: A Little Help needed standing up from a chair using your arms (e.g., wheelchair or bedside chair)?: A Little Help needed to walk in hospital  room?: A Little Help needed climbing 3-5 steps with a railing? : Total 6 Click Score: 16    End of Session Equipment Utilized During Treatment: Gait belt Activity Tolerance: Patient tolerated treatment well;Treatment limited secondary to medical complications (Comment) (c/o dizziness) Patient left: in chair;with call bell/phone within reach;with family/visitor present;with nursing/sitter in room Nurse Communication: Mobility status;Other (comment) (c/o dizziness and hypotension and need to assess safety on steps prior to progression toward d/c home) PT Visit Diagnosis: Unsteadiness on feet (R26.81);Other abnormalities of gait and mobility (R26.89);Muscle weakness (generalized) (M62.81);Pain;Difficulty in walking, not  elsewhere classified (R26.2) Pain - Right/Left: Right Pain - part of body: Hip    Time: [id number] PT Time Calculation (min) (ACUTE ONLY): 44 min   Charges:   PT Evaluation $PT Eval Low Complexity: 1 Low PT Treatments $Gait Training: 8-22 mins $Therapeutic Exercise: 8-22 mins        Baird Lyons, PT   Adair Patter 03/23/2023, 4:17 PM

## 2023-03-23 NOTE — Discharge Instructions (Signed)

## 2023-03-24 ENCOUNTER — Encounter (HOSPITAL_COMMUNITY): Payer: Self-pay | Admitting: Orthopedic Surgery

## 2023-03-24 NOTE — Anesthesia Postprocedure Evaluation (Signed)
Anesthesia Post Note  Patient: Eric Chambers  Procedure(s) Performed: TOTAL HIP ARTHROPLASTY (Right: Hip)     Patient location during evaluation: PACU Anesthesia Type: Spinal Level of consciousness: oriented and awake and alert Pain management: pain level controlled Vital Signs Assessment: post-procedure vital signs reviewed and stable Respiratory status: spontaneous breathing, respiratory function stable and patient connected to nasal cannula oxygen Cardiovascular status: blood pressure returned to baseline and stable Postop Assessment: no headache, no backache and no apparent nausea or vomiting Anesthetic complications: no  No notable events documented.  Last Vitals:  Vitals:   03/23/23 1800 03/23/23 1854  BP: (!) 145/61 (!) 158/67  Pulse:  72  Resp:    Temp:    SpO2:  99%    Last Pain:  Vitals:   03/23/23 1854  TempSrc:   PainSc: 3                  Aubrina Nieman S

## 2023-06-13 ENCOUNTER — Encounter: Payer: Self-pay | Admitting: Cardiology

## 2023-06-13 ENCOUNTER — Ambulatory Visit: Payer: Medicare Other | Attending: Nurse Practitioner | Admitting: Cardiology

## 2023-06-13 VITALS — BP 122/52 | HR 79 | Ht 68.0 in | Wt 193.0 lb

## 2023-06-13 DIAGNOSIS — E7800 Pure hypercholesterolemia, unspecified: Secondary | ICD-10-CM | POA: Diagnosis not present

## 2023-06-13 DIAGNOSIS — I10 Essential (primary) hypertension: Secondary | ICD-10-CM | POA: Diagnosis not present

## 2023-06-13 DIAGNOSIS — I351 Nonrheumatic aortic (valve) insufficiency: Secondary | ICD-10-CM

## 2023-06-13 MED ORDER — HYDROCHLOROTHIAZIDE 12.5 MG PO CAPS: 12.5000 mg | ORAL_CAPSULE | Freq: Every day | ORAL | 3 refills | Status: DC

## 2023-06-13 NOTE — Patient Instructions (Signed)
Medication Instructions:  Please start Hydrochlorothiazide 12.5 mg once daily. Continue all other medications as listed.  *If you need a refill on your cardiac medications before your next appointment, please call your pharmacy*  Testing/Procedures: Your physician has requested that you have an echocardiogram in 6 months. Echocardiography is a painless test that uses sound waves to create images of your heart. It provides your doctor with information about the size and shape of your heart and how well your heart's chambers and valves are working. This procedure takes approximately one hour. There are no restrictions for this procedure. Please do NOT wear cologne, perfume, aftershave, or lotions (deodorant is allowed). Please arrive 15 minutes prior to your appointment time.   Follow-Up: At [facility], you and your health needs are our priority.  As part of our continuing mission to provide you with exceptional heart care, we have created designated Provider Care Teams.  These Care Teams include your primary Cardiologist (physician) and Advanced Practice Providers (APPs -  Physician Assistants and Nurse Practitioners) who all work together to provide you with the care you need, when you need it.  We recommend signing up for the patient portal called "MyChart".  Sign up information is provided on this After Visit Summary.  MyChart is used to connect with patients for Virtual Visits (Telemedicine).  Patients are able to view lab/test results, encounter notes, upcoming appointments, etc.  Non-urgent messages can be sent to your provider as well.   To learn more about what you can do with MyChart, go to [email or web address].    Your next appointment:   1 year(s)  Provider:   Donato Schultz, MD

## 2023-06-13 NOTE — Progress Notes (Signed)
Cardiology Office Note:    Date:  06/13/2023   ID:  Eric Chambers, DOB Feb 12, 1946, MRN [id number]  PCP:  [name], MD   [facility] Providers Cardiologist:  Donato Schultz, MD     Referring MD: [name], MD    History of Present Illness:    Eric Chambers is a 77 y.o. male here for the follow-up of moderate aortic regurgitation, normal left ventricular dimensions, normal ejection fraction.     Aortic regurgitation occurred after a bout of endocarditis in [place].   Previously he reported he had pneumonia about a month before with a routine cough. No recurrent syncopal episodes. In the past he had passed out due to a rapid cough.   On telmisartan 80 mg.  Most of time 140 range. Rare 160. Diastolc 70   Endocarditis in [place] and again here in 2014 treated with IV antibiotics.  There was a TEE performed here in 2014 which I personally reviewed demonstrating a trileaflet aortic valve.  There is not appear to be any fusion of any leaflet segments.  His moderate regurgitation has been stable over the past several years.  Previously had a discussion with Dr. Laneta Simmers in 2019 regarding potential timing of surgery and agree that we are not at that point yet due to lack of symptoms.  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    Originally on lipitor 20 mg, but this caused LE muscle cramps in his calves.  We see his wife Eunice Blase as well; she is followed for atrial fibrillation.  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  Past Medical History:  Diagnosis Date   Aortic root dilatation (HCC) 04/08/2016   a.) TTE 04/08/2016: Ao root measured 39 mm; b.) TTE 01/12/2018: Ao root measured 38 mm; c.) TTE 06/20/2020: Ao root measured 40 mm, asc Ao 39 mm   Aortic valve regurgitation    Arthritis    Bilateral cataracts    a.) s/p extraction with IOL  placement   COPD (chronic obstructive pulmonary disease) (HCC)    Diastolic dysfunction    a.) TTE 04/08/2016: EF 55-60%, triv TR, mod AR, atrial septal aneurysm, G1DD; b.) TTE 01/12/2018: EF 60-65%, mild-mod AoV calcification, mod-sev AR, G1DD; c.) TTE 06/20/2020: EF 65-70%, sev LVH, mild AoV sclerosis with mod AR, triv MR; d.) TTE 03/02/2021: EF >55%, mild LVH, GLS -16.0%, MAC/TAC, trivial TR, moderate AR/PR.   Endocarditis 08/17/2013   Possible vegetation associated with tricuspid valve (seen on post tx echo as well)    Endocarditis 2009   a.) while living in [place]   Enlarged prostate    GERD (gastroesophageal reflux disease)    Heart murmur    "think I have one now" (08/16/2013)   High cholesterol    Hypertension    PSVT (paroxysmal supraventricular tachycardia) 01/18/2022   a.) Holter 01/18/2022: 16 SVT runs with fastest interval lasting 7 beats with a max rate of 222 bpm, longest lasted 17 beats at a rate of 125 bpm   Recurrent pneumonia    "almost once/yr" (08/16/2013)   Sleep apnea    "don't wear mask" (08/16/2013)   Squamous cell carcinoma of skin of scalp    Umbilical hernia    a.) s/p repair in approx 2011    Past Surgical History:  Procedure Laterality Date   CATARACT EXTRACTION W/ INTRAOCULAR LENS IMPLANT Right 07/05/2022  Procedure: CATARACT EXTRACTION W/ INTRAOCULAR LENS IMPLANT; Location: [facility]   CATARACT EXTRACTION W/ INTRAOCULAR LENS IMPLANT Left 07/19/2022   Procedure: CATARACT EXTRACTION W/ INTRAOCULAR LENS IMPLANT; Location: [facility]   HOLEP-LASER ENUCLEATION OF THE PROSTATE WITH MORCELLATION N/A 07/30/2022   Procedure: HOLEP-LASER ENUCLEATION OF THE PROSTATE WITH MORCELLATION;  Surgeon: Sondra Come, MD;  Location: [facility] ORS;  Service: Urology;  Laterality: N/A;   INGUINAL HERNIA REPAIR Right ~ 1950   TEE WITHOUT CARDIOVERSION N/A 08/17/2013   Procedure: TRANSESOPHAGEAL ECHOCARDIOGRAM (TEE);  Surgeon: Donato Schultz, MD;  Location: [facility] ENDOSCOPY;  Service:  Cardiovascular;  Laterality: N/A;   TONSILLECTOMY  ~ 1957   TOTAL HIP ARTHROPLASTY Right 03/23/2023   Procedure: TOTAL HIP ARTHROPLASTY;  Surgeon: Joen Laura, MD;  Location: [facility] ORS;  Service: Orthopedics;  Laterality: Right;   UMBILICAL HERNIA REPAIR  ~ 2011    Current Medications: Current Meds  Medication Sig   Budeson-Glycopyrrol-Formoterol (BREZTRI AEROSPHERE) 160-9-4.8 MCG/ACT AERO Inhale 2 puffs into the lungs in the morning and at bedtime. (Patient taking differently: Inhale 2 puffs into the lungs daily.)   hydrochlorothiazide (MICROZIDE) 12.5 MG capsule Take 1 capsule (12.5 mg total) by mouth daily.   rosuvastatin (CRESTOR) 10 MG tablet Take 10 mg by mouth daily.   telmisartan (MICARDIS) 40 MG tablet Take 80 mg by mouth daily. Pt taking 80 mg daily per his PCP Dr Freda Jackson     Allergies:   Norvasc [amlodipine besylate]   Social History   Socioeconomic History   Marital status: Married    Spouse name: Not on file   Number of children: Not on file   Years of education: Not on file   Highest education level: Not on file  Occupational History   Not on file  Tobacco Use   Smoking status: Never    Passive exposure: Never   Smokeless tobacco: Never  Vaping Use   Vaping Use: Never used  Substance and Sexual Activity   Alcohol use: Yes    Alcohol/week: 2.0 standard drinks of alcohol    Types: 2 Glasses of wine per week    Comment: occasionally   Drug use: No   Sexual activity: Yes  Other Topics Concern   Not on file  Social History Narrative   Not on file   Social Determinants of Health   Financial Resource Strain: Not on file  Food Insecurity: Not on file  Transportation Needs: Not on file  Physical Activity: Not on file  Stress: Not on file  Social Connections: Not on file     Family History: The patient's family history includes Heart attack (age of onset: 8) in his father; Hypertension in his mother.  ROS:   Please see the history of present  illness.  All other systems are reviewed and negative.    EKGs/Labs/Other Studies Reviewed:    The following studies were reviewed today:  Echo 10/08/2022:   1. Moderate to severe AI; suggest TEE to further assess.   2. Left ventricular ejection fraction, by estimation, is 60 to 65%. The  left ventricle has normal function. The left ventricle has no regional  wall motion abnormalities. There is mild concentric left ventricular  hypertrophy. Left ventricular diastolic  parameters are consistent with Grade II diastolic dysfunction  (pseudonormalization).   3. Right ventricular systolic function is normal. The right ventricular  size is normal.   4. The mitral valve is normal in structure. No evidence of mitral valve  regurgitation. No evidence of mitral stenosis.  5. The aortic valve is tricuspid. Aortic valve regurgitation is moderate  to severe. Aortic valve sclerosis/calcification is present, without any  evidence of aortic stenosis.   6. Pulmonic valve regurgitation is moderate.   7. There is mild dilatation of the ascending aorta, measuring 40 mm.   8. The inferior vena cava is normal in size with greater than 50%  respiratory variability, suggesting right atrial pressure of 3 mmHg.   Monitor 01/2022: Sinus rhythm with average heart rate of 76 bpm Brief episodes of atrial tachycardia, longest 17 beats with average heart rate of 125 beats minute. No atrial fibrillation, no pauses, no adverse arrhythmias. Rare PVCs and PACs. Overall reassuring monitor in the setting of recent syncope.     Patch Wear Time:  13 days and 19 hours (2023-01-10T11:08:42-0500 to 2023-01-24T07:03:47-0500)   Patient had a min HR of 55 bpm, max HR of 222 bpm, and avg HR of 76 bpm. Predominant underlying rhythm was Sinus Rhythm. 16 Supraventricular Tachycardia runs occurred, the run with the fastest interval lasting 7 beats with a max rate of 222 bpm, the  longest lasting 17 beats with an avg rate of  125 bpm. Isolated SVEs were rare (<1.0%), SVE Couplets were rare (<1.0%), and SVE Triplets were rare (<1.0%). Isolated VEs were rare (<1.0%), VE Couplets were rare (<1.0%), and no VE Triplets were present.   ECHO [facility] 02/2021: Echo complete  Result Value Ref Range  LV Ejection Fraction (%) 55 %  Right Ventricle Systolic Pressure (mmHg) 16 mmHg  Left Atrium Diameter (cm) 3.7 cm  LV End Diastolic Diameter (cm) 4.4 cm  LV End Systolic Diameter (cm) 1.9 cm  LV Septum Wall Thickness (cm) 1.3 cm  LV Posterior Wall Thickness (cm) 0.9 cm  Tricuspid Valve Regurgitation Grade trivial  Tricuspid Valve Regurgitation Max Velocity (m/s) 1.8 m/s  Mitral Valve Regurgitation Grade none  Mitral Valve Stenosis Grade none  Aortic Valve Regurgitation Grade moderate  Aortic Valve Stenosis Grade none   EKG:  EKG is personally reviewed. 10/14/2022: EKG is not ordered 07/22/22: Sinus rhythm. Rate 68 bpm. Left axis deviation   03/23/2022: EKG was not ordered. 12/29/2021: SR 65, borderline first-degree AV block with PR interval of 200 ms.  Recent Labs: 03/14/2023: ALT 21; BUN 21; Creatinine, Ser 0.92; Hemoglobin 13.9; Platelets 176; Potassium 3.9; Sodium 140  Recent Lipid Panel    Component Value Date/Time   CHOL 157 01/15/2014 0803   TRIG 126.0 01/15/2014 0803   HDL 41.60 01/15/2014 0803   CHOLHDL 4 01/15/2014 0803   VLDL 25.2 01/15/2014 0803   LDLCALC 90 01/15/2014 0803   LDLDIRECT 161.0 10/09/2013 1050     Risk Assessment/Calculations:              Physical Exam:    VS:  BP (!) 122/52   Pulse 79   Ht 5\' 8"  (1.727 m)   Wt 193 lb (87.5 kg)   SpO2 97%   BMI 29.35 kg/m     Wt Readings from Last 3 Encounters:  06/13/23 193 lb (87.5 kg)  03/23/23 187 lb (84.8 kg)  03/14/23 187 lb (84.8 kg)     GEN:  Well nourished, well developed in no acute distress HEENT: Normal NECK: No JVD; No carotid bruits LYMPHATICS: No lymphadenopathy CARDIAC: RRR, 1/6 diastolic murmur, no rubs, no  gallops RESPIRATORY:  Clear to auscultation without rales, wheezing or rhonchi  ABDOMEN: Soft, non-tender, non-distended MUSCULOSKELETAL:  No edema; No deformity  SKIN: Warm and dry NEUROLOGIC:  Alert and  oriented x 3 PSYCHIATRIC:  Normal affect   ASSESSMENT:    1. Aortic valve insufficiency, etiology of cardiac valve disease unspecified   2. Nonrheumatic aortic valve insufficiency   3. Pure hypercholesterolemia   4. Primary hypertension        PLAN:    In order of problems listed above:  Aortic regurgitation Moderate aortic valve regurgitation.  Currently asymptomatic.  Continue close surveillance.  At this time his left ventricular dimensions are normal. No indications for surgery at this time.  We will continue to monitor.  Checking echocardiogram in about 6 months. No significant change from prior.  He is exercising at the [facility] 3 days a week.  Treadmill.  Doing well.  Continue to encourage   Endocarditis Prior aortic valve endocarditis.  Aortic valve.  Dental prophylaxis.  2 separate bouts.  Aortic regurgitation.   Pure hypercholesterolemia LDL 144.  Doing well now with Crestor 10 mg.   He had some trouble previously with atorvastatin.  No changes made.   OSA (obstructive sleep apnea) Currently doing well with treatment.  Excellent.  Hypertension On telmisartan 80 mg currently.  Did not get benefit from valsartan.  His blood pressures at home are still in the 140 range.  We will add back low-dose HCTZ 12.5 mg.  He will message me with some values.  Dr. Tenny Craw has been working on this as well.  Last creatinine was 0.9.   Follow-up: 6 months ECHO, 1 year me.  Okay to message me hypertension numbers.  Medication Adjustments/Labs and Tests Ordered: Current medicines are reviewed at length with the patient today.  Concerns regarding medicines are outlined above.  Orders Placed This Encounter  Procedures   ECHOCARDIOGRAM COMPLETE   Meds ordered this encounter  Medications    hydrochlorothiazide (MICROZIDE) 12.5 MG capsule    Sig: Take 1 capsule (12.5 mg total) by mouth daily.    Dispense:  90 capsule    Refill:  3   Patient Instructions  Medication Instructions:  Please start Hydrochlorothiazide 12.5 mg once daily. Continue all other medications as listed.  *If you need a refill on your cardiac medications before your next appointment, please call your pharmacy*  Testing/Procedures: Your physician has requested that you have an echocardiogram in 6 months. Echocardiography is a painless test that uses sound waves to create images of your heart. It provides your doctor with information about the size and shape of your heart and how well your heart's chambers and valves are working. This procedure takes approximately one hour. There are no restrictions for this procedure. Please do NOT wear cologne, perfume, aftershave, or lotions (deodorant is allowed). Please arrive 15 minutes prior to your appointment time.   Follow-Up: At [facility], you and your health needs are our priority.  As part of our continuing mission to provide you with exceptional heart care, we have created designated Provider Care Teams.  These Care Teams include your primary Cardiologist (physician) and Advanced Practice Providers (APPs -  Physician Assistants and Nurse Practitioners) who all work together to provide you with the care you need, when you need it.  We recommend signing up for the patient portal called "MyChart".  Sign up information is provided on this After Visit Summary.  MyChart is used to connect with patients for Virtual Visits (Telemedicine).  Patients are able to view lab/test results, encounter notes, upcoming appointments, etc.  Non-urgent messages can be sent to your provider as well.   To learn more about what  you can do with MyChart, go to [email or web address].    Your next appointment:   1 year(s)  Provider:   Donato Schultz, MD         Signed, Donato Schultz, MD  06/13/2023 9:50 AM    [facility]

## 2023-06-30 ENCOUNTER — Other Ambulatory Visit: Payer: Self-pay | Admitting: *Deleted

## 2023-06-30 ENCOUNTER — Ambulatory Visit (INDEPENDENT_AMBULATORY_CARE_PROVIDER_SITE_OTHER): Payer: Medicare Other

## 2023-06-30 ENCOUNTER — Encounter: Payer: Self-pay | Admitting: Nurse Practitioner

## 2023-06-30 ENCOUNTER — Ambulatory Visit (INDEPENDENT_AMBULATORY_CARE_PROVIDER_SITE_OTHER): Payer: Medicare Other | Admitting: Nurse Practitioner

## 2023-06-30 VITALS — BP 134/62 | HR 72 | Temp 98.0°F | Ht 68.0 in | Wt 194.0 lb

## 2023-06-30 DIAGNOSIS — J209 Acute bronchitis, unspecified: Secondary | ICD-10-CM

## 2023-06-30 DIAGNOSIS — R058 Other specified cough: Secondary | ICD-10-CM

## 2023-06-30 DIAGNOSIS — J4489 Other specified chronic obstructive pulmonary disease: Secondary | ICD-10-CM | POA: Diagnosis not present

## 2023-06-30 DIAGNOSIS — J019 Acute sinusitis, unspecified: Secondary | ICD-10-CM

## 2023-06-30 DIAGNOSIS — B9689 Other specified bacterial agents as the cause of diseases classified elsewhere: Secondary | ICD-10-CM

## 2023-06-30 LAB — NITRIC OXIDE: Nitric Oxide: 19

## 2023-06-30 MED ORDER — AMOXICILLIN-POT CLAVULANATE 875-125 MG PO TABS
1.0000 | ORAL_TABLET | Freq: Two times a day (BID) | ORAL | 0 refills | Status: AC
Start: 2023-06-30 — End: 2023-07-07

## 2023-06-30 MED ORDER — PROMETHAZINE-DM 6.25-15 MG/5ML PO SYRP
5.0000 mL | ORAL_SOLUTION | Freq: Four times a day (QID) | ORAL | 0 refills | Status: DC | PRN
Start: 2023-06-30 — End: 2023-12-06

## 2023-06-30 MED ORDER — BENZONATATE 200 MG PO CAPS
200.0000 mg | ORAL_CAPSULE | Freq: Three times a day (TID) | ORAL | 1 refills | Status: DC | PRN
Start: 2023-06-30 — End: 2023-12-06

## 2023-06-30 MED ORDER — PREDNISONE 20 MG PO TABS
20.0000 mg | ORAL_TABLET | Freq: Every day | ORAL | 0 refills | Status: AC
Start: 2023-06-30 — End: 2023-07-05

## 2023-06-30 NOTE — Assessment & Plan Note (Signed)
Bronchitic type illness. See above plan. Action plan in place. Re-educated on importance of a maintenance inhaler. Instructed to resume use. Verbalized understanding.

## 2023-06-30 NOTE — Assessment & Plan Note (Signed)
Acute bronchitis with a component of upper airway irritation from postnasal drainage. He also appears to have acute sinusitis. CXR without superimposed infection. We will cover him with empiric augmentin and prednisone burst. Target sinus symptoms with saline rinses and intranasal steroid. Cough control measures. Return precautions advised.  Patient Instructions  Continue Breztri 2 puffs Twice daily. Brush tongue and rinse mouth afterwards  -Saline nasal rinses 1-2 times a day. Use bottled, distilled water. Follow with flonase nasal spray 2 sprays each nostril 20-30 minutes after you do the saline rinse -Guaifenesin (Mucinex) 600 mg Twice daily for cough/congestion -Promethazine DM cough syrup 5 mL every 6 hours as needed for cough. May cause drowsiness. Do not drive after taking. Do not use over the counter cough medicines with this -Benzonatate 1 capsule Three times a day for cough. Use consistently over the next few days to a week to help calm your cough down -Augmentin 1 pill Twice daily for 7 days. Take with food. Take a daily probiotic while taking this -Prednisone 20 mg daily for 5 days. Take in AM with food  Follow up in 2 weeks with Eric Kendal Ghazarian,NP. If symptoms do not improve or worsen, please contact office for sooner follow up or seek emergency care.

## 2023-06-30 NOTE — Patient Instructions (Addendum)
Continue Breztri 2 puffs Twice daily. Brush tongue and rinse mouth afterwards  -Saline nasal rinses 1-2 times a day. Use bottled, distilled water. Follow with flonase nasal spray 2 sprays each nostril 20-30 minutes after you do the saline rinse -Guaifenesin (Mucinex) 600 mg Twice daily for cough/congestion -Promethazine DM cough syrup 5 mL every 6 hours as needed for cough. May cause drowsiness. Do not drive after taking. Do not use over the counter cough medicines with this -Benzonatate 1 capsule Three times a day for cough. Use consistently over the next few days to a week to help calm your cough down -Augmentin 1 pill Twice daily for 7 days. Take with food. Take a daily probiotic while taking this -Prednisone 20 mg daily for 5 days. Take in AM with food  Follow up in 2 weeks with Eric Daemian Gahm,NP. If symptoms do not improve or worsen, please contact office for sooner follow up or seek emergency care.

## 2023-06-30 NOTE — Progress Notes (Signed)
@Patient  ID: Eric Chambers, male    DOB: 06/07/1946, 77 y.o.   MRN: [id number]  Chief Complaint  Patient presents with   Acute Visit    C/o productive cough-green x 1 mth. Worse at night, denies sob/wheeze. Breztri not working still using    Referring provider: [name], MD  HPI: 77 year old male, never smoker followed for COPD/asthma overlap and frequent pneumonia. He was previously followed by Dr. Thora Lance and last seen in office 01/31/2023. Past medical history significant for HTN, endocarditis, AR, OSA, HLD.   TEST/EVENTS:  01/03/2023 PFT: FVC 51, FEV1 39, ratio 58, TLC 78, DLCOcor 65. Mixed severe restrictive and obstructive disease with reversibility and moderate diffusing defect  01/18/2023 CT chest wo contrast: atherosclerosis/CAD. Mild aneurysmal dilatation of ascending aorta measuring 4 cm. Pulmonary trunk mildly distended. No LAD. Mild atelectasis b/l. Patchy airspace disease in LLL. Scattered hypodensities in left lobe of liver, likely cysts or hemangiomas.   01/31/2023: OV with Dr. Thora Lance. Pneumonia 50 years ago. Hard time clearing chest colds when he gets them. Has suspected cough related syncope. Been on singulair for a month. Cough has improved. Quite a bit of sinus congestion and postnasal drainage prior to starting which is also improved. Started on Breztri at last visit. Feels improved on this. CT chest was ordered due to persistent right perihilar opacity on CXR; no nodule or mass present.   06/30/2023: Today - acute Patient presents today for acute visit. He has been struggling with a productive cough for the last month. He's producing green phlegm. Cough is worse at night. He does have some chest congestion. He's also noticed his sinuses feel full and he has some postnasal drainage. When he blows his nose, it is the same color as the mucus he coughs up. He denies any fevers, chills, hemoptysis, shortness of breath, wheezing. Eating and drinking well. He has not been  using the [place] consistently. He used it during his last chest cold and then stopped. He started again when he got sick this time. He feels like it has helped some but hasn't cleared things up. He has not had any abx or steroids.   FeNO 19 ppb  Allergies  Allergen Reactions   Norvasc [Amlodipine Besylate] Swelling    Leg swelling     Immunization History  Administered Date(s) Administered   Influenza, High Dose Seasonal PF 09/19/2022   Influenza-Unspecified 09/19/2018, 09/19/2021   PFIZER(Purple Top)SARS-COV-2 Vaccination 01/09/2020, 01/30/2020, 09/19/2020, 09/07/2021   Pneumococcal Conjugate-13 06/06/2014   Pneumococcal Polysaccharide-23 01/05/2022    Past Medical History:  Diagnosis Date   Aortic root dilatation (HCC) 04/08/2016   a.) TTE 04/08/2016: Ao root measured 39 mm; b.) TTE 01/12/2018: Ao root measured 38 mm; c.) TTE 06/20/2020: Ao root measured 40 mm, asc Ao 39 mm   Aortic valve regurgitation    Arthritis    Bilateral cataracts    a.) s/p extraction with IOL placement   COPD (chronic obstructive pulmonary disease) (HCC)    Diastolic dysfunction    a.) TTE 04/08/2016: EF 55-60%, triv TR, mod AR, atrial septal aneurysm, G1DD; b.) TTE 01/12/2018: EF 60-65%, mild-mod AoV calcification, mod-sev AR, G1DD; c.) TTE 06/20/2020: EF 65-70%, sev LVH, mild AoV sclerosis with mod AR, triv MR; d.) TTE 03/02/2021: EF >55%, mild LVH, GLS -16.0%, MAC/TAC, trivial TR, moderate AR/PR.   Endocarditis 08/17/2013   Possible vegetation associated with tricuspid valve (seen on post tx echo as well)    Endocarditis 2009   a.) while  living in [place]   Enlarged prostate    GERD (gastroesophageal reflux disease)    Heart murmur    "think I have one now" (08/16/2013)   High cholesterol    Hypertension    PSVT (paroxysmal supraventricular tachycardia) 01/18/2022   a.) Holter 01/18/2022: 16 SVT runs with fastest interval lasting 7 beats with a max rate of 222 bpm, longest lasted 17 beats at  a rate of 125 bpm   Recurrent pneumonia    "almost once/yr" (08/16/2013)   Sleep apnea    "don't wear mask" (08/16/2013)   Squamous cell carcinoma of skin of scalp    Umbilical hernia    a.) s/p repair in approx 2011    Tobacco History: Social History   Tobacco Use  Smoking Status Never   Passive exposure: Never  Smokeless Tobacco Never   Counseling given: Not Answered   Outpatient Medications Prior to Visit  Medication Sig Dispense Refill   Budeson-Glycopyrrol-Formoterol (BREZTRI AEROSPHERE) 160-9-4.8 MCG/ACT AERO Inhale 2 puffs into the lungs in the morning and at bedtime. (Patient taking differently: Inhale 2 puffs into the lungs daily.) 1 each 11   hydrochlorothiazide (MICROZIDE) 12.5 MG capsule Take 1 capsule (12.5 mg total) by mouth daily. 90 capsule 3   rosuvastatin (CRESTOR) 10 MG tablet Take 10 mg by mouth daily.     telmisartan (MICARDIS) 40 MG tablet Take 80 mg by mouth daily. Pt taking 80 mg daily per his PCP Dr Freda Jackson     No facility-administered medications prior to visit.     Review of Systems:   Constitutional: No weight loss or gain, night sweats, fevers, chills, fatigue, or lassitude. HEENT: No headaches, difficulty swallowing, tooth/dental problems, or sore throat. No sneezing, itching, ear ache. +nasal congestion, post nasal drip CV:  No chest pain, orthopnea, PND, swelling in lower extremities, anasarca, dizziness, palpitations, syncope Resp: +productive cough, chest congestion. No shortness of breath with exertion or at rest. No hemoptysis. No wheezing.  No chest wall deformity GI:  No heartburn, indigestion, abdominal pain, nausea, vomiting, diarrhea, change in bowel habits, loss of appetite, bloody stools.  GU: No dysuria, change in color of urine, urgency or frequency.   Skin: No rash, lesions, ulcerations MSK:  No joint pain or swelling.   Neuro: No dizziness or lightheadedness.  Psych: No depression or anxiety. Mood stable.     Physical  Exam:  BP 134/62 (BP Location: Right Arm, Cuff Size: Normal)   Pulse 72   Temp 98 F (36.7 C) (Temporal)   Ht 5\' 8"  (1.727 m)   Wt 194 lb (88 kg)   SpO2 96%   BMI 29.50 kg/m   GEN: Pleasant, interactive, well-appearing; in no acute distress. HEENT:  Normocephalic and atraumatic. EACs patent bilaterally. TM pearly gray with present light reflex bilaterally. PERRLA. Sclera white. Nasal turbinates erythematous, moist and patent bilaterally. Clear rhinorrhea present. Oropharynx erythematous and moist, without exudate or edema. No lesions, ulcerations NECK:  Supple w/ fair ROM. No JVD present. Normal carotid impulses w/o bruits. Thyroid symmetrical with no goiter or nodules palpated. Cervical LAD b/l. CV: RRR, no m/r/g, no peripheral edema. Pulses intact, +2 bilaterally. No cyanosis, pallor or clubbing. PULMONARY:  Unlabored, regular breathing. Scattered rhonchi bilaterally A&P. Bronchitic cough. No accessory muscle use.  GI: BS present and normoactive. Soft, non-tender to palpation. No organomegaly or masses detected. MSK: No erythema, warmth or tenderness. Cap refil <2 sec all extrem. No deformities or joint swelling noted.  Neuro: A/Ox3. No focal deficits  noted.   Skin: Warm, no lesions or rashe Psych: Normal affect and behavior. Judgement and thought content appropriate.     Lab Results:  CBC    Component Value Date/Time   WBC 6.6 03/14/2023 1311   RBC 4.67 03/14/2023 1311   HGB 13.9 03/14/2023 1311   HCT 42.7 03/14/2023 1311   PLT 176 03/14/2023 1311   MCV 91.4 03/14/2023 1311   MCH 29.8 03/14/2023 1311   MCHC 32.6 03/14/2023 1311   RDW 14.5 03/14/2023 1311   LYMPHSABS 1.3 03/14/2023 1311   MONOABS 0.5 03/14/2023 1311   EOSABS 0.0 03/14/2023 1311   BASOSABS 0.0 03/14/2023 1311    BMET    Component Value Date/Time   NA 140 03/14/2023 1311   K 3.9 03/14/2023 1311   CL 106 03/14/2023 1311   CO2 25 03/14/2023 1311   GLUCOSE 105 (H) 03/14/2023 1311   BUN 21 03/14/2023  1311   CREATININE 0.92 03/14/2023 1311   CALCIUM 8.8 (L) 03/14/2023 1311   GFRNONAA >60 03/14/2023 1311   GFRAA >60 09/08/2018 2116    BNP No results found for: "BNP"   Imaging:  DG Chest 2 View  Result Date: 06/30/2023 CLINICAL DATA:  Productive cough. EXAM: CHEST - 2 VIEW COMPARISON:  November 24, 2022. FINDINGS: The heart size and mediastinal contours are within normal limits. Both lungs are clear. The visualized skeletal structures are unremarkable. IMPRESSION: No active cardiopulmonary disease. Electronically Signed   By: Lupita Raider M.D.   On: 06/30/2023 10:25    Administration History     None          Latest Ref Rng & Units 01/03/2023    9:03 AM  PFT Results  FVC-Pre L 1.98   FVC-Predicted Pre % 51   FVC-Post L 2.30   FVC-Predicted Post % 59   Pre FEV1/FVC % % 55   Post FEV1/FCV % % 58   FEV1-Pre L 1.10   FEV1-Predicted Pre % 39   FEV1-Post L 1.33   DLCO uncorrected ml/min/mmHg 15.40   DLCO UNC% % 65   DLCO corrected ml/min/mmHg 15.40   DLCO COR %Predicted % 65   DLVA Predicted % 97   TLC L 5.20   TLC % Predicted % 78   RV % Predicted % 121     Lab Results  Component Value Date   NITRICOXIDE 19 06/30/2023        Assessment & Plan:   Acute bronchitis Acute bronchitis with a component of upper airway irritation from postnasal drainage. He also appears to have acute sinusitis. CXR without superimposed infection. We will cover him with empiric augmentin and prednisone burst. Target sinus symptoms with saline rinses and intranasal steroid. Cough control measures. Return precautions advised.  Patient Instructions  Continue Breztri 2 puffs Twice daily. Brush tongue and rinse mouth afterwards  -Saline nasal rinses 1-2 times a day. Use bottled, distilled water. Follow with flonase nasal spray 2 sprays each nostril 20-30 minutes after you do the saline rinse -Guaifenesin (Mucinex) 600 mg Twice daily for cough/congestion -Promethazine DM cough syrup 5  mL every 6 hours as needed for cough. May cause drowsiness. Do not drive after taking. Do not use over the counter cough medicines with this -Benzonatate 1 capsule Three times a day for cough. Use consistently over the next few days to a week to help calm your cough down -Augmentin 1 pill Twice daily for 7 days. Take with food. Take a daily probiotic while taking this -  Prednisone 20 mg daily for 5 days. Take in AM with food  Follow up in 2 weeks with Katie Genetta Fiero,NP. If symptoms do not improve or worsen, please contact office for sooner follow up or seek emergency care.    Acute bacterial rhinosinusitis See above  COPD with asthma Bronchitic type illness. See above plan. Action plan in place. Re-educated on importance of a maintenance inhaler. Instructed to resume use. Verbalized understanding.    I spent 35 minutes of dedicated to the care of this patient on the date of this encounter to include pre-visit review of records, face-to-face time with the patient discussing conditions above, post visit ordering of testing, clinical documentation with the electronic health record, making appropriate referrals as documented, and communicating necessary findings to members of the patients care team.  Noemi Chapel, NP 06/30/2023  Pt aware and understands NP's role.

## 2023-06-30 NOTE — Assessment & Plan Note (Signed)
See above

## 2023-07-27 ENCOUNTER — Ambulatory Visit: Payer: Medicare Other | Admitting: Primary Care

## 2023-08-04 ENCOUNTER — Encounter: Payer: Self-pay | Admitting: Nurse Practitioner

## 2023-08-04 ENCOUNTER — Ambulatory Visit (INDEPENDENT_AMBULATORY_CARE_PROVIDER_SITE_OTHER): Payer: Medicare Other | Admitting: Nurse Practitioner

## 2023-08-04 VITALS — BP 120/66 | HR 77 | Ht 68.0 in | Wt 196.0 lb

## 2023-08-04 DIAGNOSIS — J4489 Other specified chronic obstructive pulmonary disease: Secondary | ICD-10-CM

## 2023-08-04 MED ORDER — ALBUTEROL SULFATE HFA 108 (90 BASE) MCG/ACT IN AERS
2.0000 | INHALATION_SPRAY | Freq: Four times a day (QID) | RESPIRATORY_TRACT | 2 refills | Status: DC | PRN
Start: 2023-08-04 — End: 2023-12-06

## 2023-08-04 NOTE — Patient Instructions (Addendum)
Continue Breztri 2 puffs Twice daily. Brush tongue and rinse mouth afterwards Continue Albuterol inhaler 2 puffs every 6 hours as needed for shortness of breath or wheezing. Notify if symptoms persist despite rescue inhaler/neb use.    Follow up in 3 months with Dr. Francine Graven (new pt slot). If symptoms do not improve or worsen, please contact office for sooner follow up or seek emergency care.

## 2023-08-04 NOTE — Assessment & Plan Note (Signed)
COPD with asthma with recent exacerbation. Resolved with clinical improvement.  Understands importance of maintenance therapy.  He will continue triple therapy regimen.  Provided with as needed Sharolyn Douglas for rescue purposes.  Educated on proper use.  Medication side effect profile reviewed.  Action plan in place.  Patient Instructions  Continue Breztri 2 puffs Twice daily. Brush tongue and rinse mouth afterwards Continue Albuterol inhaler 2 puffs every 6 hours as needed for shortness of breath or wheezing. Notify if symptoms persist despite rescue inhaler/neb use.    Follow up in 3 months with Dr. Francine Graven (new pt slot). If symptoms do not improve or worsen, please contact office for sooner follow up or seek emergency care.

## 2023-08-04 NOTE — Progress Notes (Signed)
@Patient  ID: Eric Chambers, male    DOB: December 14, 1946, 77 y.o.   MRN: [id number]  Chief Complaint  Patient presents with   Follow-up    Breathing is overall doing well. He has had some persistent cough- prod with grey sputum.     Referring provider: [name], MD  HPI: 77 year old male, never smoker followed for COPD/asthma overlap and frequent pneumonia. He was previously followed by Eric Chambers and last seen in office 06/30/2023 by Eric Quarry NP. Past medical history significant for HTN, endocarditis, AR, OSA, HLD.   TEST/EVENTS:  01/03/2023 PFT: FVC 51, FEV1 39, ratio 58, TLC 78, DLCOcor 65. Mixed severe restrictive and obstructive disease with reversibility and moderate diffusing defect  01/18/2023 CT chest wo contrast: atherosclerosis/CAD. Mild aneurysmal dilatation of ascending aorta measuring 4 cm. Pulmonary trunk mildly distended. No LAD. Mild atelectasis b/l. Patchy airspace disease in LLL. Scattered hypodensities in left lobe of liver, likely cysts or hemangiomas. 06/30/2023 CXR: Lungs are clear  01/31/2023: OV with Eric Chambers. Pneumonia 50 years ago. Hard time clearing chest colds when he gets them. Has suspected cough related syncope. Been on singulair for a month. Cough has improved. Quite a bit of sinus congestion and postnasal drainage prior to starting which is also improved. Started on Breztri at last visit. Feels improved on this. CT chest was ordered due to persistent right perihilar opacity on CXR; no nodule or mass present.   06/30/2023: OV with  NP for acute visit. He has been struggling with a productive cough for the last month. He's producing green phlegm. Cough is worse at night. He does have some chest congestion. He's also noticed his sinuses feel full and he has some postnasal drainage. When he blows his nose, it is the same color as the mucus he coughs up. He denies any fevers, chills, hemoptysis, shortness of breath, wheezing. Eating and drinking well. He has not  been using the [place] consistently. He used it during his last chest cold and then stopped. He started again when he got sick this time. He feels like it has helped some but hasn't cleared things up. He has not had any abx or steroids.  FeNO 19 ppb  08/04/2023: Today - follow up Patient presents today for follow-up after being treated for acute bronchitis and sinusitis with Augmentin and prednisone course.  He feels much better today.  He has also resumed his Eric Chambers which she does feel like it is helping.  Understands that he needs to be using this daily.  He has occasional with minimal white to gray phlegm.  Significantly better than it was previously.  Not noticing any wheezing.  Sinus symptoms have also resolved.  Denies any fevers, chills, hemoptysis, orthopnea, lower extremity swelling.  He was able to stop the cough medicines and sinus rinses.  Allergies  Allergen Reactions   Norvasc [Amlodipine Besylate] Swelling    Leg swelling     Immunization History  Administered Date(s) Administered   Influenza, High Dose Seasonal PF 09/19/2022   Influenza-Unspecified 09/19/2018, 09/19/2021   PFIZER(Purple Top)SARS-COV-2 Vaccination 01/09/2020, 01/30/2020, 09/19/2020, 09/07/2021   Pneumococcal Conjugate-13 06/06/2014   Pneumococcal Polysaccharide-23 01/05/2022    Past Medical History:  Diagnosis Date   Aortic root dilatation (HCC) 04/08/2016   a.) TTE 04/08/2016: Ao root measured 39 mm; b.) TTE 01/12/2018: Ao root measured 38 mm; c.) TTE 06/20/2020: Ao root measured 40 mm, asc Ao 39 mm   Aortic valve regurgitation    Arthritis  Bilateral cataracts    a.) s/p extraction with IOL placement   COPD (chronic obstructive pulmonary disease) (HCC)    Diastolic dysfunction    a.) TTE 04/08/2016: EF 55-60%, triv TR, mod AR, atrial septal aneurysm, G1DD; b.) TTE 01/12/2018: EF 60-65%, mild-mod AoV calcification, mod-sev AR, G1DD; c.) TTE 06/20/2020: EF 65-70%, sev LVH, mild AoV sclerosis with mod  AR, triv MR; d.) TTE 03/02/2021: EF >55%, mild LVH, GLS -16.0%, MAC/TAC, trivial TR, moderate AR/PR.   Endocarditis 08/17/2013   Possible vegetation associated with tricuspid valve (seen on post tx echo as well)    Endocarditis 2009   a.) while living in [place]   Enlarged prostate    GERD (gastroesophageal reflux disease)    Heart murmur    "think I have one now" (08/16/2013)   High cholesterol    Hypertension    PSVT (paroxysmal supraventricular tachycardia) 01/18/2022   a.) Holter 01/18/2022: 16 SVT runs with fastest interval lasting 7 beats with a max rate of 222 bpm, longest lasted 17 beats at a rate of 125 bpm   Recurrent pneumonia    "almost once/yr" (08/16/2013)   Sleep apnea    "don't wear mask" (08/16/2013)   Squamous cell carcinoma of skin of scalp    Umbilical hernia    a.) s/p repair in approx 2011    Tobacco History: Social History   Tobacco Use  Smoking Status Never   Passive exposure: Never  Smokeless Tobacco Never   Counseling given: Not Answered   Outpatient Medications Prior to Visit  Medication Sig Dispense Refill   benzonatate (TESSALON) 200 MG capsule Take 1 capsule (200 mg total) by mouth 3 (three) times daily as needed for cough. 30 capsule 1   Budeson-Glycopyrrol-Formoterol (BREZTRI AEROSPHERE) 160-9-4.8 MCG/ACT AERO Inhale 2 puffs into the lungs in the morning and at bedtime. (Patient taking differently: Inhale 2 puffs into the lungs daily.) 1 each 11   hydrochlorothiazide (MICROZIDE) 12.5 MG capsule Take 1 capsule (12.5 mg total) by mouth daily. 90 capsule 3   promethazine-dextromethorphan (PROMETHAZINE-DM) 6.25-15 MG/5ML syrup Take 5 mLs by mouth 4 (four) times daily as needed for cough. 180 mL 0   rosuvastatin (CRESTOR) 10 MG tablet Take 10 mg by mouth daily.     telmisartan (MICARDIS) 80 MG tablet Take 80 mg by mouth daily.     telmisartan (MICARDIS) 40 MG tablet Take 80 mg by mouth daily. Pt taking 80 mg daily per his PCP Dr Eric Chambers     No  facility-administered medications prior to visit.     Review of Systems:   Constitutional: No weight loss or gain, night sweats, fevers, chills, fatigue, or lassitude. HEENT: No headaches, difficulty swallowing, tooth/dental problems, or sore throat. No sneezing, itching, ear ache. +nasal congestion, post nasal drip (resolved) CV:  No chest pain, orthopnea, PND, swelling in lower extremities, anasarca, dizziness, palpitations, syncope Resp: No shortness of breath with exertion or at rest. No cough. No hemoptysis. No wheezing.  No chest wall deformity GI:  No heartburn, indigestion, abdominal pain, nausea, vomiting, diarrhea, change in bowel habits, loss of appetite, bloody stools.  GU: No dysuria, change in color of urine, urgency or frequency.   Skin: No rash, lesions, ulcerations MSK:  No joint pain or swelling.   Neuro: No dizziness or lightheadedness.  Psych: No depression or anxiety. Mood stable.     Physical Exam:  BP 120/66 (BP Location: Left Arm, Cuff Size: Normal)   Pulse 77   Ht 5\' 8"  (  1.727 m)   Wt 196 lb (88.9 kg)   SpO2 97% Comment: on RA  BMI 29.80 kg/m   GEN: Pleasant, interactive, well-appearing; in no acute distress. HEENT:  Normocephalic and atraumatic. EACs patent bilaterally. TM pearly gray with present light reflex bilaterally. PERRLA. Sclera white. Nasal turbinates pink, moist and patent bilaterally. Clear rhinorrhea present. Oropharynx pink and moist, without exudate or edema. No lesions, ulcerations NECK:  Supple w/ fair ROM. No JVD present. Normal carotid impulses w/o bruits. Thyroid symmetrical with no goiter or nodules palpated. No lymphadenopathy. CV: RRR, no m/r/g, no peripheral edema. Pulses intact, +2 bilaterally. No cyanosis, pallor or clubbing. PULMONARY:  Unlabored, regular breathing. Clear bilaterally A&P w/o wheezes/rales/rhonchi. No accessory muscle use.  GI: BS present and normoactive. Soft, non-tender to palpation. No organomegaly or masses  detected. MSK: No erythema, warmth or tenderness. Cap refil <2 sec all extrem. No deformities or joint swelling noted.  Neuro: A/Ox3. No focal deficits noted.   Skin: Warm, no lesions or rashe Psych: Normal affect and behavior. Judgement and thought content appropriate.     Lab Results:  CBC    Component Value Date/Time   WBC 6.6 03/14/2023 1311   RBC 4.67 03/14/2023 1311   HGB 13.9 03/14/2023 1311   HCT 42.7 03/14/2023 1311   PLT 176 03/14/2023 1311   MCV 91.4 03/14/2023 1311   MCH 29.8 03/14/2023 1311   MCHC 32.6 03/14/2023 1311   RDW 14.5 03/14/2023 1311   LYMPHSABS 1.3 03/14/2023 1311   MONOABS 0.5 03/14/2023 1311   EOSABS 0.0 03/14/2023 1311   BASOSABS 0.0 03/14/2023 1311    BMET    Component Value Date/Time   NA 140 03/14/2023 1311   K 3.9 03/14/2023 1311   CL 106 03/14/2023 1311   CO2 25 03/14/2023 1311   GLUCOSE 105 (H) 03/14/2023 1311   BUN 21 03/14/2023 1311   CREATININE 0.92 03/14/2023 1311   CALCIUM 8.8 (L) 03/14/2023 1311   GFRNONAA >60 03/14/2023 1311   GFRAA >60 09/08/2018 2116    BNP No results found for: "BNP"   Imaging:  No results found.  Administration History     None          Latest Ref Rng & Units 01/03/2023    9:03 AM  PFT Results  FVC-Pre L 1.98   FVC-Predicted Pre % 51   FVC-Post L 2.30   FVC-Predicted Post % 59   Pre FEV1/FVC % % 55   Post FEV1/FCV % % 58   FEV1-Pre L 1.10   FEV1-Predicted Pre % 39   FEV1-Post L 1.33   DLCO uncorrected ml/min/mmHg 15.40   DLCO UNC% % 65   DLCO corrected ml/min/mmHg 15.40   DLCO COR %Predicted % 65   DLVA Predicted % 97   TLC L 5.20   TLC % Predicted % 78   RV % Predicted % 121     Lab Results  Component Value Date   NITRICOXIDE 19 06/30/2023        Assessment & Plan:   COPD with asthma COPD with asthma with recent exacerbation. Resolved with clinical improvement.  Understands importance of maintenance therapy.  He will continue triple therapy regimen.  Provided with  as needed Sharolyn Douglas for rescue purposes.  Educated on proper use.  Medication side effect profile reviewed.  Action plan in place.  Patient Instructions  Continue Breztri 2 puffs Twice daily. Brush tongue and rinse mouth afterwards Continue Albuterol inhaler 2 puffs every 6 hours as needed  for shortness of breath or wheezing. Notify if symptoms persist despite rescue inhaler/neb use.    Follow up in 3 months with Dr. Francine Graven (new pt slot). If symptoms do not improve or worsen, please contact office for sooner follow up or seek emergency care.     I spent 25 minutes of dedicated to the care of this patient on the date of this encounter to include pre-visit review of records, face-to-face time with the patient discussing conditions above, post visit ordering of testing, clinical documentation with the electronic health record, making appropriate referrals as documented, and communicating necessary findings to members of the patients care team.  Noemi Chapel, NP 08/04/2023  Pt aware and understands NP's role.

## 2023-10-24 ENCOUNTER — Telehealth: Payer: Self-pay | Admitting: Pulmonary Disease

## 2023-10-24 DIAGNOSIS — R0982 Postnasal drip: Secondary | ICD-10-CM

## 2023-10-24 DIAGNOSIS — R052 Subacute cough: Secondary | ICD-10-CM

## 2023-10-24 NOTE — Telephone Encounter (Signed)
Pt requesting antibiotic for nasal drip and cough. Dr.Dewald please advice

## 2023-10-24 NOTE — Telephone Encounter (Signed)
Patient is experiencing nasal drip (going into his lungs) and a cough. He thinks he may need an antibiotic. Please call and advise. [phone]

## 2023-10-25 MED ORDER — FLUTICASONE PROPIONATE 50 MCG/ACT NA SUSP
1.0000 | Freq: Every day | NASAL | 2 refills | Status: DC
Start: 2023-10-25 — End: 2023-12-06

## 2023-10-25 MED ORDER — IPRATROPIUM BROMIDE 0.03 % NA SOLN
2.0000 | Freq: Two times a day (BID) | NASAL | 12 refills | Status: DC
Start: 2023-10-25 — End: 2023-12-06

## 2023-10-25 NOTE — Telephone Encounter (Signed)
Patient is aware of below message and voiced his understanding.  He denied wheezing, fever or SOB.  He reports of prod cough with green sputum mainly at night and post nasal drip.  Routing to Dr. Francine Graven to make aware.

## 2023-10-25 NOTE — Telephone Encounter (Signed)
I have sent in flonase nasal spray and ipratropium nasal spray for post-nasal drainage.   Is he having any cough, fevers, shortness of breath, wheezing?  Thanks, JD

## 2023-10-26 MED ORDER — AZITHROMYCIN 250 MG PO TABS
ORAL_TABLET | ORAL | 0 refills | Status: DC
Start: 2023-10-26 — End: 2023-11-07

## 2023-10-26 NOTE — Telephone Encounter (Signed)
Spoke with patient to let him know Dr.Dewald sent in a Zpack to his pharmacy. Patient's voice was understanding. Nothing else further needed.

## 2023-10-26 NOTE — Telephone Encounter (Signed)
Thanks. Zpak sent to pharmacy as well.  Dr. Francine Graven

## 2023-11-07 ENCOUNTER — Encounter: Payer: Self-pay | Admitting: Pulmonary Disease

## 2023-11-07 ENCOUNTER — Ambulatory Visit (INDEPENDENT_AMBULATORY_CARE_PROVIDER_SITE_OTHER): Payer: Medicare Other | Admitting: Pulmonary Disease

## 2023-11-07 VITALS — BP 128/82 | HR 61 | Temp 97.7°F | Ht 68.0 in | Wt 195.0 lb

## 2023-11-07 DIAGNOSIS — J4489 Other specified chronic obstructive pulmonary disease: Secondary | ICD-10-CM

## 2023-11-07 NOTE — Patient Instructions (Addendum)
Use breztri 2 puffs twice daily - rinse mouth out after each use  Use albuterol inhaler 1-2 puffs every 4-6 hours  Follow up in 6 months

## 2023-11-07 NOTE — Progress Notes (Signed)
Synopsis: Hx of asthma-COPD overlap syndrome  Subjective:   PATIENT ID: Eric Chambers GENDER: male DOB: [birth year], MRN: [id number]  HPI  Chief Complaint  Patient presents with   Follow-up    Breathing has been doing well. His symptoms improved on zpack that was called in for him on 10/26/23.    Eric Chambers 77 year old male, never smoker followed for COPD/asthma overlap and frequent pneumonia. He was previously followed by Dr. Thora Lance and last seen in office 08/04/2023 by Eric Quarry NP. Past medical history significant for HTN, endocarditis, AR, OSA, HLD.   He presents with a persistent cough and chest congestion. He reports that an antibiotic prescribed by the doctor has significantly improved his symptoms. He admits to inconsistent use of his prescribed inhaler, Breztri, stating that he is generally "anti-drug" and only takes medication for blood pressure and cholesterol regularly. He mentions that he has had similar symptoms in the past, which were resolved with antibiotics. He believes his symptoms are due to sinus infection draining into his lungs, exacerbated by lying down at night. He also reports a history of exposure to secondhand smoke and pesticides, which he believes may have contributed to his current lung condition.  He is a retired Clinical research associate. His father was a chain smoker and he had significant second hand smoke exposure.  Past Medical History:  Diagnosis Date   Aortic root dilatation (HCC) 04/08/2016   a.) TTE 04/08/2016: Ao root measured 39 mm; b.) TTE 01/12/2018: Ao root measured 38 mm; c.) TTE 06/20/2020: Ao root measured 40 mm, asc Ao 39 mm   Aortic valve regurgitation    Arthritis    Bilateral cataracts    a.) s/p extraction with IOL placement   COPD (chronic obstructive pulmonary disease) (HCC)    Diastolic dysfunction    a.) TTE 04/08/2016: EF 55-60%, triv TR, mod AR, atrial septal aneurysm, G1DD; b.) TTE 01/12/2018: EF 60-65%, mild-mod AoV calcification,  mod-sev AR, G1DD; c.) TTE 06/20/2020: EF 65-70%, sev LVH, mild AoV sclerosis with mod AR, triv MR; d.) TTE 03/02/2021: EF >55%, mild LVH, GLS -16.0%, MAC/TAC, trivial TR, moderate AR/PR.   Endocarditis 08/17/2013   Possible vegetation associated with tricuspid valve (seen on post tx echo as well)    Endocarditis 2009   a.) while living in [place]   Enlarged prostate    GERD (gastroesophageal reflux disease)    Heart murmur    "think I have one now" (08/16/2013)   High cholesterol    Hypertension    PSVT (paroxysmal supraventricular tachycardia) (HCC) 01/18/2022   a.) Holter 01/18/2022: 16 SVT runs with fastest interval lasting 7 beats with a max rate of 222 bpm, longest lasted 17 beats at a rate of 125 bpm   Recurrent pneumonia    "almost once/yr" (08/16/2013)   Sleep apnea    "don't wear mask" (08/16/2013)   Squamous cell carcinoma of skin of scalp    Umbilical hernia    a.) s/p repair in approx 2011     Family History  Problem Relation Age of Onset   Hypertension Mother    Heart attack Father 64     Social History   Socioeconomic History   Marital status: Married    Spouse name: Not on file   Number of children: Not on file   Years of education: Not on file   Highest education level: Not on file  Occupational History   Not on file  Tobacco Use   Smoking status: Never  Passive exposure: Never   Smokeless tobacco: Never  Vaping Use   Vaping status: Never Used  Substance and Sexual Activity   Alcohol use: Yes    Alcohol/week: 2.0 standard drinks of alcohol    Types: 2 Glasses of wine per week    Comment: occasionally   Drug use: No   Sexual activity: Yes  Other Topics Concern   Not on file  Social History Narrative   Not on file   Social Determinants of Health   Financial Resource Strain: Not on file  Food Insecurity: Not on file  Transportation Needs: Not on file  Physical Activity: Not on file  Stress: Not on file  Social Connections: Not on file   Intimate Partner Violence: Not on file     Allergies  Allergen Reactions   Norvasc [Amlodipine Besylate] Swelling    Leg swelling      Outpatient Medications Prior to Visit  Medication Sig Dispense Refill   albuterol (VENTOLIN HFA) 108 (90 Base) MCG/ACT inhaler Inhale 2 puffs into the lungs every 6 (six) hours as needed for wheezing or shortness of breath. 8 g 2   benzonatate (TESSALON) 200 MG capsule Take 1 capsule (200 mg total) by mouth 3 (three) times daily as needed for cough. 30 capsule 1   Budeson-Glycopyrrol-Formoterol (BREZTRI AEROSPHERE) 160-9-4.8 MCG/ACT AERO Inhale 2 puffs into the lungs in the morning and at bedtime. (Patient taking differently: Inhale 2 puffs into the lungs daily.) 1 each 11   fluticasone (FLONASE) 50 MCG/ACT nasal spray Place 1 spray into both nostrils daily. 16 g 2   hydrochlorothiazide (MICROZIDE) 12.5 MG capsule Take 1 capsule (12.5 mg total) by mouth daily. 90 capsule 3   ipratropium (ATROVENT) 0.03 % nasal spray Place 2 sprays into both nostrils every 12 (twelve) hours. 30 mL 12   promethazine-dextromethorphan (PROMETHAZINE-DM) 6.25-15 MG/5ML syrup Take 5 mLs by mouth 4 (four) times daily as needed for cough. 180 mL 0   rosuvastatin (CRESTOR) 10 MG tablet Take 10 mg by mouth daily.     telmisartan (MICARDIS) 80 MG tablet Take 80 mg by mouth daily.     azithromycin (ZITHROMAX) 250 MG tablet Take as directed 6 tablet 0   No facility-administered medications prior to visit.    Review of Systems  Constitutional:  Negative for chills, fever, malaise/fatigue and weight loss.  HENT:  Negative for congestion, sinus pain and sore throat.   Eyes: Negative.   Respiratory:  Negative for cough, hemoptysis, sputum production, shortness of breath and wheezing.   Cardiovascular:  Negative for chest pain, palpitations, orthopnea, claudication and leg swelling.  Gastrointestinal:  Negative for abdominal pain, heartburn, nausea and vomiting.  Genitourinary:  Negative.   Musculoskeletal:  Negative for joint pain and myalgias.  Skin:  Negative for rash.  Neurological:  Negative for weakness.  Endo/Heme/Allergies: Negative.   Psychiatric/Behavioral: Negative.     Objective:   Vitals:   11/07/23 0926  BP: 128/82  Pulse: 61  Temp: 97.7 F (36.5 C)  TempSrc: Oral  SpO2: 99%  Weight: 195 lb (88.5 kg)  Height: 5\' 8"  (1.727 m)   Physical Exam Constitutional:      General: He is not in acute distress.    Appearance: Normal appearance.  Eyes:     General: No scleral icterus.    Conjunctiva/sclera: Conjunctivae normal.  Cardiovascular:     Rate and Rhythm: Normal rate and regular rhythm.  Pulmonary:     Breath sounds: No wheezing, rhonchi or rales.  Musculoskeletal:     Right lower leg: No edema.     Left lower leg: No edema.  Skin:    General: Skin is warm and dry.  Neurological:     General: No focal deficit present.    CBC    Component Value Date/Time   WBC 6.6 03/14/2023 1311   RBC 4.67 03/14/2023 1311   HGB 13.9 03/14/2023 1311   HCT 42.7 03/14/2023 1311   PLT 176 03/14/2023 1311   MCV 91.4 03/14/2023 1311   MCH 29.8 03/14/2023 1311   MCHC 32.6 03/14/2023 1311   RDW 14.5 03/14/2023 1311   LYMPHSABS 1.3 03/14/2023 1311   MONOABS 0.5 03/14/2023 1311   EOSABS 0.0 03/14/2023 1311   BASOSABS 0.0 03/14/2023 1311      Latest Ref Rng & Units 03/14/2023    1:11 PM 07/30/2022    8:53 AM 05/26/2022    2:27 PM  BMP  Glucose 70 - 99 mg/dL 161  096  045   BUN 8 - 23 mg/dL 21  20  23    Creatinine 0.61 - 1.24 mg/dL 4.09  8.11  9.14   Sodium 135 - 145 mmol/L 140  140  138   Potassium 3.5 - 5.1 mmol/L 3.9  3.8  3.5   Chloride 98 - 111 mmol/L 106  102  102   CO2 22 - 32 mmol/L 25   27   Calcium 8.9 - 10.3 mg/dL 8.8   8.6    Chest imaging: CXR 06/30/23 The heart size and mediastinal contours are within normal limits. Both lungs are clear. The visualized skeletal structures are unremarkable.  CT Chest 01/18/23 1. Scattered  opacities at the left lung base, possible atelectasis or infiltrate. 2. Aortic atherosclerosis with aneurysmal dilatation of the ascending aorta measuring 4 cm. Recommend annual imaging followup by CTA or MRA. This recommendation follows 2010 ACCF/AHA/AATS/ACR/ASA/SCA/SCAI/SIR/STS/SVM Guidelines for the Diagnosis and Management of Patients with Thoracic Aortic Disease. Circulation. 2010; 121: N829-F621. Aortic aneurysm NOS (ICD10-I71.9) 3. Distended pulmonary trunk suggesting underlying pulmonary artery hypertension. 4. Coronary artery calcifications.  PFT:    Latest Ref Rng & Units 01/03/2023    9:03 AM  PFT Results  FVC-Pre L 1.98   FVC-Predicted Pre % 51   FVC-Post L 2.30   FVC-Predicted Post % 59   Pre FEV1/FVC % % 55   Post FEV1/FCV % % 58   FEV1-Pre L 1.10   FEV1-Predicted Pre % 39   FEV1-Post L 1.33   DLCO uncorrected ml/min/mmHg 15.40   DLCO UNC% % 65   DLCO corrected ml/min/mmHg 15.40   DLCO COR %Predicted % 65   DLVA Predicted % 97   TLC L 5.20   TLC % Predicted % 78   RV % Predicted % 121     Labs:  Path:  Echo:  Heart Catheterization:    Assessment & Plan:   Asthma-COPD overlap syndrome [facility])  Discussion: [place] Eisenbeis 77 year old male, never smoker followed for COPD/asthma overlap and frequent pneumonia. He was previously followed by Dr. Thora Lance and last seen in office 08/04/2023 by Eric Quarry NP. Past medical history significant for HTN, endocarditis, AR, OSA, HLD.   Asthma-COPD Overlap Syndrome History of coughing and green sputum production, improved with antibiotics. Patient has a history of significant environmental exposures including secondhand smoke and pesticides. Spirometry shows obstruction. Patient is not consistently using Breztri. -Continue Breztri two puffs twice daily. -Consider repeat spirometry for comparison, though it may not change management. -Use albuterol inhaler as  needed. -Follow-up in 6 months or sooner if symptoms  worsen.  Melody Comas, MD [facility] Office: [phone]   Current Outpatient Medications:    albuterol (VENTOLIN HFA) 108 (90 Base) MCG/ACT inhaler, Inhale 2 puffs into the lungs every 6 (six) hours as needed for wheezing or shortness of breath., Disp: 8 g, Rfl: 2   benzonatate (TESSALON) 200 MG capsule, Take 1 capsule (200 mg total) by mouth 3 (three) times daily as needed for cough., Disp: 30 capsule, Rfl: 1   Budeson-Glycopyrrol-Formoterol (BREZTRI AEROSPHERE) 160-9-4.8 MCG/ACT AERO, Inhale 2 puffs into the lungs in the morning and at bedtime. (Patient taking differently: Inhale 2 puffs into the lungs daily.), Disp: 1 each, Rfl: 11   fluticasone (FLONASE) 50 MCG/ACT nasal spray, Place 1 spray into both nostrils daily., Disp: 16 g, Rfl: 2   hydrochlorothiazide (MICROZIDE) 12.5 MG capsule, Take 1 capsule (12.5 mg total) by mouth daily., Disp: 90 capsule, Rfl: 3   ipratropium (ATROVENT) 0.03 % nasal spray, Place 2 sprays into both nostrils every 12 (twelve) hours., Disp: 30 mL, Rfl: 12   promethazine-dextromethorphan (PROMETHAZINE-DM) 6.25-15 MG/5ML syrup, Take 5 mLs by mouth 4 (four) times daily as needed for cough., Disp: 180 mL, Rfl: 0   rosuvastatin (CRESTOR) 10 MG tablet, Take 10 mg by mouth daily., Disp: , Rfl:    telmisartan (MICARDIS) 80 MG tablet, Take 80 mg by mouth daily., Disp: , Rfl:

## 2023-12-06 ENCOUNTER — Encounter: Payer: Self-pay | Admitting: Cardiology

## 2023-12-06 ENCOUNTER — Ambulatory Visit: Payer: Medicare Other | Attending: Cardiology | Admitting: Cardiology

## 2023-12-06 VITALS — BP 124/60 | HR 72 | Ht 67.5 in | Wt 197.0 lb

## 2023-12-06 DIAGNOSIS — I351 Nonrheumatic aortic (valve) insufficiency: Secondary | ICD-10-CM | POA: Diagnosis not present

## 2023-12-06 NOTE — Progress Notes (Signed)
Cardiology Office Note:  .   Date:  12/06/2023  ID:  Eric Chambers, DOB 1946/06/16, MRN [id number] PCP: [name], MD  [facility] Providers Cardiologist:  Donato Schultz, MD     History of Present Illness: .   Eric Chambers is a 77 y.o. male Discussed with the use of AI scribe   History of Present Illness   The patient is a 77 year old male with a history of moderate aortic regurgitation, initially occurring after an episode of endocarditis in 2009 and recurring in 2014. The aortic valve is trileaflet with no apparent fusions or leaflet segments. The patient's condition has remained stable over the years, with no need for surgical intervention.  The patient underwent hip replacement surgery in 2024 and is currently employed at a horse [employer]. He previously experienced muscle aches with atorvastatin 20mg , but is now tolerating rosuvastatin 10mg  daily for hyperlipidemia. However, his LDL remains elevated at 144 as of January 2024.  The patient has obstructive sleep apnea, which is being managed. He reports no significant issues with his heart, maintaining his usual activities without undue exertion or discomfort. He does note a persistent cough and respiratory issues, which he attributes to past exposure to pesticides.  The patient's most recent echocardiogram in October 2023 showed moderate to severe aortic insufficiency and a mildly dilated ascending aorta.  The patient is also on telmisartan for hypertension, which was recently increased due to elevated blood pressure readings. He reports no adverse effects from the increased dosage. The patient's hip replacement has healed well, and he reports no current issues with his hip.           Studies Reviewed: Marland Kitchen   EKG Interpretation Date/Time:  Tuesday December 06 2023 08:58:23 EST Ventricular Rate:  72 PR Interval:  194 QRS Duration:  104 QT Interval:  406 QTC Calculation: 444 R Axis:   -66  Text Interpretation: Normal  sinus rhythm Left axis deviation When compared with ECG of 08-Sep-2018 21:18, Incomplete right bundle branch block is no longer Present Confirmed by Donato Schultz ([id number]) on 12/06/2023 9:13:43 AM    Results   LABS LDL: 144 mg/dL (19/14/7829)  DIAGNOSTIC Echocardiogram: Moderate to severe aortic insufficiency, ascending aorta 40 mm, mildly dilated (10/08/2022) Echocardiogram: Moderate aortic valve regurgitation (2022) Transesophageal echocardiogram: Trileaflet aortic valve, no fusions or leaflet segments, moderate regurgitation (2014)     Risk Assessment/Calculations:            Physical Exam:   VS:  BP 124/60   Pulse 72   Ht 5' 7.5" (1.715 m)   Wt 197 lb (89.4 kg)   SpO2 98%   BMI 30.40 kg/m    Wt Readings from Last 3 Encounters:  12/06/23 197 lb (89.4 kg)  11/07/23 195 lb (88.5 kg)  08/04/23 196 lb (88.9 kg)    GEN: Well nourished, well developed in no acute distress NECK: No JVD; No carotid bruits CARDIAC: RRR, 2/6 diastolic murmur, no rubs, no gallops RESPIRATORY:  Clear to auscultation without rales, wheezing or rhonchi  ABDOMEN: Soft, non-tender, non-distended EXTREMITIES:  No edema; No deformity   ASSESSMENT AND PLAN: .    Assessment and Plan    Aortic Regurgitation Moderate aortic regurgitation secondary to endocarditis in 2009 and 2014. Stable with normal left ventricular dimensions and ejection fraction. Recent echocardiogram (10/08/2022) showed moderate to severe aortic insufficiency with a mildly dilated ascending aorta (40 mm). Asymptomatic with no significant functional limitations. Discussed monitoring for symptoms such as increased exertional dyspnea or  decreased exercise tolerance. Surgical intervention may be considered if symptoms worsen. Patient understands and agrees to follow up if symptoms change. - Order echocardiogram on 12/12/2023 - Review echocardiogram results and notify patient of findings  Hyperlipidemia Elevated LDL (144 mg/dL on 1/61/0960).  Previously tried atorvastatin 20 mg but experienced muscle aches. Currently on rosuvastatin 10 mg daily with good tolerance. Discussed importance of maintaining LDL levels within target range to reduce cardiovascular risk. - Continue rosuvastatin 10 mg daily - Monitor lipid levels and adjust treatment as necessary  Obstructive Sleep Apnea On treatment for obstructive sleep apnea with no complaints regarding management. - Continue current treatment  General Health Maintenance Generally in good health with well-managed cardiovascular status. Recent hip surgery for arthritis was successful. - Annual follow-up visit in one year - Encourage regular exercise and monitoring for any new symptoms  Follow-up - Schedule follow-up visit in one year - Notify patient of echocardiogram results and any necessary follow-up actions.              Signed, Donato Schultz, MD

## 2023-12-06 NOTE — Patient Instructions (Signed)

## 2023-12-12 ENCOUNTER — Ambulatory Visit (HOSPITAL_COMMUNITY): Payer: Medicare Other | Attending: Cardiology

## 2023-12-12 DIAGNOSIS — I351 Nonrheumatic aortic (valve) insufficiency: Secondary | ICD-10-CM | POA: Insufficient documentation

## 2023-12-12 LAB — ECHOCARDIOGRAM COMPLETE
Area-P 1/2: 2.8 cm2
P 1/2 time: 272 ms
S' Lateral: 2.6 cm

## 2024-03-04 ENCOUNTER — Other Ambulatory Visit: Payer: Self-pay

## 2024-03-04 ENCOUNTER — Emergency Department (HOSPITAL_COMMUNITY)

## 2024-03-04 ENCOUNTER — Emergency Department (HOSPITAL_COMMUNITY)
Admission: EM | Admit: 2024-03-04 | Discharge: 2024-03-04 | Disposition: A | Discharge status: Home / Self Care | Attending: Emergency Medicine | Admitting: Emergency Medicine

## 2024-03-04 ENCOUNTER — Encounter (HOSPITAL_COMMUNITY): Payer: Self-pay

## 2024-03-04 DIAGNOSIS — R569 Unspecified convulsions: Secondary | ICD-10-CM | POA: Insufficient documentation

## 2024-03-04 DIAGNOSIS — R55 Syncope and collapse: Secondary | ICD-10-CM | POA: Diagnosis present

## 2024-03-04 DIAGNOSIS — E860 Dehydration: Secondary | ICD-10-CM | POA: Diagnosis not present

## 2024-03-04 LAB — RESP PANEL BY RT-PCR (RSV, FLU A&B, COVID)  RVPGX2
Influenza A by PCR: NEGATIVE
Influenza B by PCR: NEGATIVE
Resp Syncytial Virus by PCR: NEGATIVE
SARS Coronavirus 2 by RT PCR: NEGATIVE

## 2024-03-04 LAB — BASIC METABOLIC PANEL
Anion gap: 12 (ref 5–15)
BUN: 19 mg/dL (ref 8–23)
CO2: 26 mmol/L (ref 22–32)
Calcium: 8.9 mg/dL (ref 8.9–10.3)
Chloride: 101 mmol/L (ref 98–111)
Creatinine, Ser: 1.42 mg/dL — ABNORMAL HIGH (ref 0.61–1.24)
GFR, Estimated: 51 mL/min — ABNORMAL LOW (ref >60)
Glucose, Bld: 142 mg/dL — ABNORMAL HIGH (ref 70–99)
Potassium: 3.9 mmol/L (ref 3.5–5.1)
Sodium: 139 mmol/L (ref 135–145)

## 2024-03-04 LAB — CBC WITH DIFFERENTIAL/PLATELET
Abs Immature Granulocytes: 0.12 10*3/uL — ABNORMAL HIGH (ref 0.00–0.07)
Basophils Absolute: 0 10*3/uL (ref 0.0–0.1)
Basophils Relative: 0 %
Eosinophils Absolute: 0 10*3/uL (ref 0.0–0.5)
Eosinophils Relative: 0 %
HCT: 43.8 % (ref 39.0–52.0)
Hemoglobin: 14.8 g/dL (ref 13.0–17.0)
Immature Granulocytes: 1 %
Lymphocytes Relative: 4 %
Lymphs Abs: 0.6 10*3/uL — ABNORMAL LOW (ref 0.7–4.0)
MCH: 30.5 pg (ref 26.0–34.0)
MCHC: 33.8 g/dL (ref 30.0–36.0)
MCV: 90.3 fL (ref 80.0–100.0)
Monocytes Absolute: 1.1 10*3/uL — ABNORMAL HIGH (ref 0.1–1.0)
Monocytes Relative: 8 %
Neutro Abs: 12.4 10*3/uL — ABNORMAL HIGH (ref 1.7–7.7)
Neutrophils Relative %: 87 %
Platelets: 137 10*3/uL — ABNORMAL LOW (ref 150–400)
RBC: 4.85 MIL/uL (ref 4.22–5.81)
RDW: 13 % (ref 11.5–15.5)
WBC: 14.3 10*3/uL — ABNORMAL HIGH (ref 4.0–10.5)
nRBC: 0 % (ref 0.0–0.2)

## 2024-03-04 LAB — TROPONIN I (HIGH SENSITIVITY): Troponin I (High Sensitivity): 8 ng/L (ref <18)

## 2024-03-04 MED ORDER — MIDAZOLAM HCL 2 MG/2ML IJ SOLN
2.0000 mg | Freq: Once | INTRAMUSCULAR | Status: AC | PRN
  Administered 2024-03-04: 2 mg via INTRAVENOUS
  Filled 2024-03-04: qty 2

## 2024-03-04 MED ORDER — SODIUM CHLORIDE 0.9 % IV BOLUS
1000.0000 mL | Freq: Once | INTRAVENOUS | Status: AC
  Administered 2024-03-04: 1000 mL via INTRAVENOUS

## 2024-03-04 MED ORDER — GADOBUTROL 1 MMOL/ML IV SOLN
9.0000 mL | Freq: Once | INTRAVENOUS | Status: AC | PRN
  Administered 2024-03-04: 9 mL via INTRAVENOUS

## 2024-03-04 NOTE — ED Provider Notes (Signed)
 Pt with possible sz today.  1 week of cough which was worse but chronic cough for years and today had tonic/clonic shaking where he bit his tongue and was incontinent.  of confusion.  Neuro states MRI of brain with and without.  IF MRI is neg then pt will need outpt neuro follow up and does not need anti-epileptics at this time and can have outpt EEG.  Currently at baseline here. MRI today is negative for acute findings other than sinus inflammation.  Speaking with the patient he reports since Wednesday he has had significant sinus drainage that did improve some with Zyrtec.  He does not have a sore throat or a fever.  Low suspicion for bacterial sinusitis.  At this time patient will be referred to neurology for outpatient workup.  Discussed this with the patient and called his daughter and they are comfortable with this plan.  He was given return precautions.   Gwyneth Sprout, MD 03/04/24 1911

## 2024-03-04 NOTE — ED Provider Notes (Signed)
 [place] EMERGENCY DEPARTMENT AT [facility] Provider Note   CSN: [id number] Arrival date & time: 03/04/24  1332     History  Chief Complaint  Patient presents with   Loss of Consciousness    Eric Chambers is a 78 y.o. male presented to ED with brief loss of consciousness.  Patient reports he has had a dry cough for about a week with nasal congestion.  He says he has been very thirsty and urinating, feels dehydrated.  He said today he was trying to drink Gatorade the coffee table and began to feel lightheaded with vertigo, and fell to the ground.     Update, the patient's daughter now at the bedside reports that she witnessed the patient have generalized tonic-clonic shaking of the arms and legs lasting about a minute.  Afterwards he was "out completely cold" for about 1 to 2 minutes.  Then he was awake but nonverbal for several minutes.  They report that he did bite his tongue twice.  He had incontinence.  The patient ports to me he may have had 1 seizure years ago during a coughing fit but no underlying known history otherwise.  EMS checked his sugar at 111 and gave him 500 cc of fluid.  Patient reports he feels "just weak all over" but denies specifically chest pain or fevers or chills.  He reports 1 history of syncope in the past during a coughing spell, but no other issues with syncope.  He reports a history of aortic insufficiency and says that he is getting competing opinions about valve replacement in the future, but does not typically have dyspnea on exertion.  He also has issues with chronic coughing.  He reports his typical systolic pressure is around 120 mmhg, and he does take a single blood pressure medication for that  HPI     Home Medications Prior to Admission medications   Medication Sig Start Date End Date Taking? Authorizing Provider  hydrochlorothiazide (MICROZIDE) 12.5 MG capsule Take 1 capsule (12.5 mg total) by mouth daily. 06/13/23   Jake Bathe,  MD  rosuvastatin (CRESTOR) 10 MG tablet Take 10 mg by mouth daily.    [provider]  telmisartan (MICARDIS) 80 MG tablet Take 80 mg by mouth daily. 07/18/23   [provider]      Allergies    Norvasc [amlodipine besylate]    Review of Systems   Review of Systems  Physical Exam Updated Vital Signs BP (!) 117/49   Pulse 77   Temp 98 F (36.7 C)   Resp 16   Ht 5' 7.5" (1.715 m)   Wt 86.2 kg   SpO2 100%   BMI 29.32 kg/m  Physical Exam Constitutional:      General: He is not in acute distress. HENT:     Head: Normocephalic and atraumatic.  Eyes:     Conjunctiva/sclera: Conjunctivae normal.     Pupils: Pupils are equal, round, and reactive to light.  Cardiovascular:     Rate and Rhythm: Normal rate and regular rhythm.     Heart sounds: Murmur heard.  Pulmonary:     Effort: Pulmonary effort is normal. No respiratory distress.  Abdominal:     General: There is no distension.     Tenderness: There is no abdominal tenderness.  Skin:    General: Skin is warm and dry.  Neurological:     General: No focal deficit present.     Mental Status: He is alert. Mental status  is at baseline.  Psychiatric:        Mood and Affect: Mood normal.        Behavior: Behavior normal.     ED Results / Procedures / Treatments   Labs (all labs ordered are listed, but only abnormal results are displayed) Labs Reviewed  RESP PANEL BY RT-PCR (RSV, FLU A&B, COVID)  [id number]  BASIC METABOLIC PANEL  CBC WITH DIFFERENTIAL/PLATELET  RAPID URINE DRUG SCREEN, HOSP PERFORMED  TROPONIN I (HIGH SENSITIVITY)  TROPONIN I (HIGH SENSITIVITY)    EKG EKG Interpretation Date/Time:  Sunday March 04 2024 13:38:16 EDT Ventricular Rate:  74 PR Interval:  196 QRS Duration:  106 QT Interval:  399 QTC Calculation: 443 R Axis:   236  Text Interpretation: Sinus rhythm Probable right ventricular hypertrophy Confirmed by Alvester Chou [phone]) on 03/04/2024 1:45:54 PM  Radiology DG Chest  2 View Result Date: 03/04/2024 CLINICAL DATA:  Cough EXAM: CHEST - 2 VIEW COMPARISON:  06/30/2023, 01/18/2023 FINDINGS: The heart size and mediastinal contours are within normal limits. No focal airspace consolidation, pleural effusion, or pneumothorax. The visualized skeletal structures are unremarkable. IMPRESSION: No active cardiopulmonary disease. Electronically Signed   By: Duanne Guess D.O.   On: 03/04/2024 14:24    Procedures Procedures    Medications Ordered in ED Medications  sodium chloride 0.9 % bolus 1,000 mL (has no administration in time range)  midazolam (VERSED) injection 2 mg (has no administration in time range)    ED Course/ Medical Decision Making/ A&P Clinical Course as of 03/04/24 1532  Sun Mar 04, 2024  1516 Dr Selina Cooley neurology recommending MRI brain with and without contrast for stroke evaluation as potential cause of new onset seizure at his age; no indication for AED at this time; if abnormal MRI findings, can consult neurology, otherwise pt can be referred for outpatient EEG [MT]    Clinical Course User Index [MT] Melaney Tellefsen, Kermit Balo, MD                                 Medical Decision Making Amount and/or Complexity of Data Reviewed Labs: ordered. Radiology: ordered.  Risk Prescription drug management.   This patient presents to the ED with concern for syncope versus near syncope. This involves an extensive number of treatment options, and is a complaint that carries with it a high risk of complications and morbidity.  The differential diagnosis includes vasovagal episode versus dehydration versus anemia versus arrhythmia versus atypical ACS versus viral illness versus other  Additional history obtained from patient's wife at bedside I ordered imaging studies including dg chest, mri brain I independently visualized and interpreted imaging which showed no emergent findings on xray; MRI pending at signout I agree with the radiologist  interpretation  The patient was maintained on a cardiac monitor.  I personally viewed and interpreted the cardiac monitored which showed an underlying rhythm of: NSR  Per my interpretation the patient's ECG shows no acute ischemic findings  I ordered medication including IV fluid bolus for hydration, IV Versed for anxiolysis for MRI brain  I have reviewed the patients home medicines and have made adjustments as needed   Dispostion:  Patient is signed out to Dr Anitra Lauth EDP pending follow up on labs and MRI imaging for syncope vs seizure evaluation.         Final Clinical Impression(s) / ED Diagnoses Final diagnoses:  None    Rx / DC Orders  ED Discharge Orders     None         Terald Sleeper, MD 03/04/24 [phone]

## 2024-03-04 NOTE — ED Notes (Signed)
 PT ao x 4.  Ambulated to waiting room.  Called and spoke with family.  They are en-route to pick pt up.

## 2024-03-04 NOTE — ED Notes (Signed)
 Patient transported to MRI

## 2024-03-04 NOTE — Discharge Instructions (Signed)
 It is okay to use the Zyrtec once a day as needed if you are having a lot of nasal drainage concerning for allergies.  The MRI today showed that your sinuses were swollen but no signs of stroke.  It will be important for you to follow-up with neurology as an outpatient because of this episode you had today but somebody should call you to set up that appointment.  If you have any further episodes return to the emergency room.

## 2024-03-04 NOTE — ED Triage Notes (Signed)
"  From home, said he has had a respiratory cold for last few days, this morning he was eating lunch and he had a syncopal episode and his right arm was jerking for about a minute, no injury. Was not post ictal on EMS arrival, no history of seizures. Did vomit after episode, but no recurrent vomiting. CBG was 111, gave NS in route. A&O x 4 on our arrival and on arrival to ED, no complaints. Able to walk to stretcher, just generalized weakness" per EMS

## 2024-03-06 ENCOUNTER — Ambulatory Visit (INDEPENDENT_AMBULATORY_CARE_PROVIDER_SITE_OTHER): Admitting: Neurology

## 2024-03-06 ENCOUNTER — Encounter: Payer: Self-pay | Admitting: Neurology

## 2024-03-06 VITALS — BP 122/50 | HR 91 | Ht 67.0 in | Wt 192.0 lb

## 2024-03-06 DIAGNOSIS — R569 Unspecified convulsions: Secondary | ICD-10-CM

## 2024-03-06 NOTE — Patient Instructions (Signed)
 Routine EEG, if abnormal will likely start antiseizure medication, and if normal will obtain a 3-day ambulatory EEG Continue your other medications Follow-up in 6 months to if worse Discussed driving prescription for total of 2-month, patient voiced understanding.

## 2024-03-06 NOTE — Progress Notes (Signed)
 [facility]  PATIENT: Eric Chambers DOB: [birth year]  REQUESTING CLINICIAN: Gwyneth Sprout, MD HISTORY FROM: Patient  REASON FOR VISIT: Seizure    HISTORICAL  CHIEF COMPLAINT:  Chief Complaint  Patient presents with   New Patient (Initial Visit)    Rm13, alone, NP internal referral for Seizure-like activity:last episode was Sunday afternoon but pt thinks he was just dehydrated. Went to er on Sunday     HISTORY OF PRESENT ILLNESS:  This is a 78 year old gentleman past medical history of hypertension, hyperlipidemia who is presenting after a seizure on Sunday.  Patient tells me the past week prior to the event, he was struggling with acute respiratory inflammation, post nasal drip. He was taking Claritin, and was not drinking enough fluid.  While having dinner with his daughter, he collapsed, he tells me that he felt dehydrated and was taken to the hospital.  He reports feeling better after IV fluid. Per chart review, daughter reported the patient collapsed while having dinner with her, on the ground he had generalized tonic-clonic seizures lasting 1 to 2-minutes, with urinary incontinence and tongue biting.  This was followed with a 10-minutes confusion.  EMS was called and patient taken to the hospital.  In the hospital he did have a MRI brain with and without contrast without any acute abnormality, he was back to his baseline and discharged home with neuro follow-up. He tells me 5 years ago he had a seizure versus syncope event.  He reported that time also had a upper respiratory inflammation, was coughing and passed out from that.  Never been evaluated for seizures.   Handedness: Right handed   Onset: 03/04/2024  Seizure Type: Generalized convulsion   Current frequency: Only once   Any injuries from seizures: tongue biting   Seizure risk factors: Denies   Previous ASMs: None  Currenty ASMs: None   ASMs side effects: N/A  Brain Images: No acute  findings   Previous EEGs: Not previously done    OTHER MEDICAL CONDITIONS: hypertension, Hyperlipidemia   REVIEW OF SYSTEMS: Full 14 system review of systems performed and negative with exception of: As noted in the HPI   ALLERGIES: Allergies  Allergen Reactions   Norvasc [Amlodipine Besylate] Swelling    Leg swelling     HOME MEDICATIONS: Outpatient Medications Prior to Visit  Medication Sig Dispense Refill   hydrochlorothiazide (MICROZIDE) 12.5 MG capsule Take 1 capsule (12.5 mg total) by mouth daily. 90 capsule 3   rosuvastatin (CRESTOR) 10 MG tablet Take 10 mg by mouth daily.     telmisartan (MICARDIS) 80 MG tablet Take 80 mg by mouth daily.     No facility-administered medications prior to visit.    PAST MEDICAL HISTORY: Past Medical History:  Diagnosis Date   Aortic root dilatation (HCC) 04/08/2016   a.) TTE 04/08/2016: Ao root measured 39 mm; b.) TTE 01/12/2018: Ao root measured 38 mm; c.) TTE 06/20/2020: Ao root measured 40 mm, asc Ao 39 mm   Aortic valve regurgitation    Arthritis    Bilateral cataracts    a.) s/p extraction with IOL placement   COPD (chronic obstructive pulmonary disease) (HCC)    Diastolic dysfunction    a.) TTE 04/08/2016: EF 55-60%, triv TR, mod AR, atrial septal aneurysm, G1DD; b.) TTE 01/12/2018: EF 60-65%, mild-mod AoV calcification, mod-sev AR, G1DD; c.) TTE 06/20/2020: EF 65-70%, sev LVH, mild AoV sclerosis with mod AR, triv MR; d.) TTE 03/02/2021: EF >55%, mild LVH, GLS -16.0%, MAC/TAC, trivial TR, moderate  AR/PR.   Endocarditis 08/17/2013   Possible vegetation associated with tricuspid valve (seen on post tx echo as well)    Endocarditis 2009   a.) while living in [place]   Enlarged prostate    GERD (gastroesophageal reflux disease)    Heart murmur    "think I have one now" (08/16/2013)   High cholesterol    Hypertension    PSVT (paroxysmal supraventricular tachycardia) (HCC) 01/18/2022   a.) Holter 01/18/2022: 16 SVT runs with  fastest interval lasting 7 beats with a max rate of 222 bpm, longest lasted 17 beats at a rate of 125 bpm   Recurrent pneumonia    "almost once/yr" (08/16/2013)   Sleep apnea    "don't wear mask" (08/16/2013)   Squamous cell carcinoma of skin of scalp    Umbilical hernia    a.) s/p repair in approx 2011    PAST SURGICAL HISTORY: Past Surgical History:  Procedure Laterality Date   CATARACT EXTRACTION W/ INTRAOCULAR LENS IMPLANT Right 07/05/2022   Procedure: CATARACT EXTRACTION W/ INTRAOCULAR LENS IMPLANT; Location: [facility]   CATARACT EXTRACTION W/ INTRAOCULAR LENS IMPLANT Left 07/19/2022   Procedure: CATARACT EXTRACTION W/ INTRAOCULAR LENS IMPLANT; Location: [facility]   HOLEP-LASER ENUCLEATION OF THE PROSTATE WITH MORCELLATION N/A 07/30/2022   Procedure: HOLEP-LASER ENUCLEATION OF THE PROSTATE WITH MORCELLATION;  Surgeon: Sondra Come, MD;  Location: [facility] ORS;  Service: Urology;  Laterality: N/A;   INGUINAL HERNIA REPAIR Right ~ 1950   TEE WITHOUT CARDIOVERSION N/A 08/17/2013   Procedure: TRANSESOPHAGEAL ECHOCARDIOGRAM (TEE);  Surgeon: Donato Schultz, MD;  Location: [facility] ENDOSCOPY;  Service: Cardiovascular;  Laterality: N/A;   TONSILLECTOMY  ~ 1957   TOTAL HIP ARTHROPLASTY Right 03/23/2023   Procedure: TOTAL HIP ARTHROPLASTY;  Surgeon: Joen Laura, MD;  Location: [facility] ORS;  Service: Orthopedics;  Laterality: Right;   UMBILICAL HERNIA REPAIR  ~ 2011    FAMILY HISTORY: Family History  Problem Relation Age of Onset   Hypertension Mother    Heart attack Father 90    SOCIAL HISTORY: Social History   Socioeconomic History   Marital status: Married    Spouse name: Not on file   Number of children: Not on file   Years of education: Not on file   Highest education level: Not on file  Occupational History   Not on file  Tobacco Use   Smoking status: Never    Passive exposure: Never   Smokeless tobacco: Never  Vaping Use   Vaping status: Never Used  Substance and Sexual Activity    Alcohol use: Yes    Alcohol/week: 2.0 standard drinks of alcohol    Types: 2 Glasses of wine per week    Comment: occasionally   Drug use: No   Sexual activity: Yes  Other Topics Concern   Not on file  Social History Narrative   Not on file   Social Drivers of Health   Financial Resource Strain: Not on file  Food Insecurity: Not on file  Transportation Needs: Not on file  Physical Activity: Not on file  Stress: Not on file  Social Connections: Not on file  Intimate Partner Violence: Not on file    PHYSICAL EXAM  GENERAL EXAM/CONSTITUTIONAL: Vitals:  Vitals:   03/06/24 1445  BP: (!) 122/50  Pulse: 91  Weight: 192 lb (87.1 kg)  Height: 5\' 7"  (1.702 m)   Body mass index is 30.07 kg/m. Wt Readings from Last 3 Encounters:  03/06/24 192 lb (87.1 kg)  03/04/24 190 lb (  86.2 kg)  12/06/23 197 lb (89.4 kg)   Patient is in no distress; well developed, nourished and groomed; neck is supple  MUSCULOSKELETAL: Gait, strength, tone, movements noted in Neurologic exam below  NEUROLOGIC: MENTAL STATUS:      No data to display         awake, alert, oriented to person, place and time recent and remote memory intact normal attention and concentration language fluent, comprehension intact, naming intact fund of knowledge appropriate  CRANIAL NERVE:  2nd, 3rd, 4th, 6th - Visual fields full to confrontation, extraocular muscles intact, no nystagmus 5th - facial sensation symmetric 7th - facial strength symmetric 8th - hearing intact 9th - palate elevates symmetrically, uvula midline 11th - shoulder shrug symmetric 12th - tongue protrusion midline  MOTOR:  normal bulk and tone, full strength in the BUE, BLE  SENSORY:  normal and symmetric to light touch  COORDINATION:  finger-nose-finger, fine finger movements normal  GAIT/STATION:  normal  DIAGNOSTIC DATA (LABS, IMAGING, TESTING) - I reviewed patient records, labs, notes, testing and imaging myself where  available.  Lab Results  Component Value Date   WBC 14.3 (H) 03/04/2024   HGB 14.8 03/04/2024   HCT 43.8 03/04/2024   MCV 90.3 03/04/2024   PLT 137 (L) 03/04/2024      Component Value Date/Time   NA 139 03/04/2024 1354   K 3.9 03/04/2024 1354   CL 101 03/04/2024 1354   CO2 26 03/04/2024 1354   GLUCOSE 142 (H) 03/04/2024 1354   BUN 19 03/04/2024 1354   CREATININE 1.42 (H) 03/04/2024 1354   CALCIUM 8.9 03/04/2024 1354   PROT 6.5 03/14/2023 1311   ALBUMIN 3.9 03/14/2023 1311   AST 20 03/14/2023 1311   ALT 21 03/14/2023 1311   ALKPHOS 42 03/14/2023 1311   BILITOT 0.7 03/14/2023 1311   GFRNONAA 51 (L) 03/04/2024 1354   GFRAA >60 09/08/2018 2116   Lab Results  Component Value Date   CHOL 157 01/15/2014   HDL 41.60 01/15/2014   LDLCALC 90 01/15/2014   LDLDIRECT 161.0 10/09/2013   TRIG 126.0 01/15/2014   No results found for: "HGBA1C" No results found for: "VITAMINB12" Lab Results  Component Value Date   TSH 1.663 08/16/2013    MRI Brain 03/04/2024 1. No acute brain finding. 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 Age related volume loss and minimal small vessel change of the cerebral hemispheric white matter. 2. Inflammatory changes throughout the paranasal sinuses consistent with rhinosinusitis.   ASSESSMENT AND PLAN  78 y.o. year old male  with hypertension, hyperlipidemia who is presenting after a seizure witnessed by daughter on Sunday.  His MRI Brain did not show any acute abnormality.  Plan will be to obtain routine EEG, if normal will proceed with 3-day ambulatory EEG.  If any abnormality will likely start patient on antiseizure medication.  This was discussed with patient and he is comfortable with plans.  We also discussed driving restriction for total of 6 months, he voiced understanding.  Return in 6 months or sooner if worse.   1. Seizures (HCC)     Patient Instructions  Routine EEG, if abnormal will likely start antiseizure medication, and if  normal will obtain a 3-day ambulatory EEG Continue your other medications Follow-up in 6 months to if worse Discussed driving prescription for total of 68-month, patient voiced understanding.   Per [facility] statutes, patients with seizures are not allowed to drive until they have been seizure-free for six months.  Other recommendations include using caution when using heavy equipment or power tools. Avoid working on ladders or at heights. Take showers instead of baths.  Do not swim alone.  Ensure the water temperature is not too high on the home water heater. Do not go swimming alone. Do not lock yourself in a room alone (i.e. bathroom). When caring for infants or small children, sit down when holding, feeding, or changing them to minimize risk of injury to the child in the event you have a seizure. Maintain good sleep hygiene. Avoid alcohol.  Also recommend adequate sleep, hydration, good diet and minimize stress.   During the Seizure  - First, ensure adequate ventilation and place patients on the floor on their left side  Loosen clothing around the neck and ensure the airway is patent. If the patient is clenching the teeth, do not force the mouth open with any object as this can cause severe damage - Remove all items from the surrounding that can be hazardous. The patient may be oblivious to what's happening and may not even know what he or she is doing. If the patient is confused and wandering, either gently guide him/her away and block access to outside areas - Reassure the individual and be comforting - Call 911. In most cases, the seizure ends before EMS arrives. However, there are cases when seizures may last over 3 to 5 minutes. Or the individual may have developed breathing difficulties or severe injuries. If a pregnant patient or a person with diabetes develops a seizure, it is prudent to call an ambulance. - Finally, if the patient does not regain full consciousness, then call  EMS. Most patients will remain confused for about 45 to 90 minutes after a seizure, so you must use judgment in calling for help. - Avoid restraints but make sure the patient is in a bed with padded side rails - Place the individual in a lateral position with the neck slightly flexed; this will help the saliva drain from the mouth and prevent the tongue from falling backward - Remove all nearby furniture and other hazards from the area - Provide verbal assurance as the individual is regaining consciousness - Provide the patient with privacy if possible - Call for help and start treatment as ordered by the caregiver   After the Seizure (Postictal Stage)  After a seizure, most patients experience confusion, fatigue, muscle pain and/or a headache. Thus, one should permit the individual to sleep. For the next few days, reassurance is essential. Being calm and helping reorient the person is also of importance.  Most seizures are painless and end spontaneously. Seizures are not harmful to others but can lead to complications such as stress on the lungs, brain and the heart. Individuals with prior lung problems may develop labored breathing and respiratory distress.    Discussed Patients with epilepsy have a small risk of sudden unexpected death, a condition referred to as sudden unexpected death in epilepsy (SUDEP). SUDEP is defined specifically as the sudden, unexpected, witnessed or unwitnessed, nontraumatic and nondrowning death in patients with epilepsy with or without evidence for a seizure, and excluding documented status epilepticus, in which post mortem examination does not reveal a structural or toxicologic cause for death     Orders Placed This Encounter  Procedures   EEG adult    No orders of the defined types were placed in this encounter.   Return in about 6 months (around 09/06/2024).    Windell Norfolk, MD 03/06/2024, 3:21 PM  [facility] [address], [address] [place], [place] [address] [phone]

## 2024-03-13 ENCOUNTER — Ambulatory Visit (INDEPENDENT_AMBULATORY_CARE_PROVIDER_SITE_OTHER): Admitting: Neurology

## 2024-03-13 DIAGNOSIS — R569 Unspecified convulsions: Secondary | ICD-10-CM

## 2024-03-13 NOTE — Procedures (Signed)
    History:  78 year old man with seizure   EEG classification: Awake and drowsy  Duration: 25 minutes   Technical aspects: This EEG study was done with scalp electrodes positioned according to the 10-20 International system of electrode placement. Electrical activity was reviewed with band pass filter of 1-70Hz , sensitivity of 7 uV/mm, display speed of 34mm/sec with a 60Hz  notched filter applied as appropriate. EEG data were recorded continuously and digitally stored.   Description of the recording: The background rhythms of this recording consists of a fairly well modulated medium amplitude alpha rhythm of 11 Hz that is reactive to eye opening and closure. Present in the anterior head region is a 15-20 Hz beta activity. Photic stimulation was performed, did not show any abnormalities. Hyperventilation was not performed. Drowsiness was manifested by background fragmentation. No abnormal epileptiform discharges seen during this recording. There was no focal slowing. There were no electrographic seizure identified.   Abnormality: None   Impression: This is a normal awake and drowsy EEG. No evidence of interictal epileptiform discharges. Normal EEGs, however, do not rule out epilepsy.    Windell Norfolk, MD [facility]

## 2024-03-14 ENCOUNTER — Other Ambulatory Visit: Payer: Self-pay | Admitting: Neurology

## 2024-03-14 ENCOUNTER — Telehealth: Payer: Self-pay

## 2024-03-14 ENCOUNTER — Encounter: Payer: Self-pay | Admitting: Neurology

## 2024-03-14 DIAGNOSIS — R569 Unspecified convulsions: Secondary | ICD-10-CM

## 2024-03-24 ENCOUNTER — Other Ambulatory Visit: Payer: Self-pay | Admitting: Cardiology

## 2024-05-10 ENCOUNTER — Telehealth: Payer: Self-pay

## 2024-05-10 NOTE — Telephone Encounter (Signed)
 Notified by [facility] that patient was schedule for ambulatory EEG andthen cancelled. Have made attempts to reschedule but unsuccessful

## 2024-05-15 NOTE — Telephone Encounter (Signed)
 Patient called in to reschedule his EEG. Was given the number for [facility] - [phone]

## 2024-05-21 DIAGNOSIS — R569 Unspecified convulsions: Secondary | ICD-10-CM

## 2024-05-26 ENCOUNTER — Encounter (INDEPENDENT_AMBULATORY_CARE_PROVIDER_SITE_OTHER): Payer: Self-pay | Admitting: Neurology

## 2024-05-26 ENCOUNTER — Ambulatory Visit: Payer: Self-pay | Admitting: Neurology

## 2024-05-26 DIAGNOSIS — R569 Unspecified convulsions: Secondary | ICD-10-CM

## 2024-05-26 NOTE — Procedures (Signed)
 Clinical History: This is a 78 y/o M who presents with seizure like activity that lasted 1-2 minutes of tonic clonic activity followed by 10 minutes of confusion.  INTERMITTENT MONITORING with VIDEO TECHNICAL SUMMARY: This AVEEG was performed using equipment provided by [employer] utilizing Bluetooth ( [employer] ) amplifiers with continuous EEGT attended video collection using encrypted remote transmission via [employer] secured cellular tower network with data rates for each AVEEG performed. This is a Therapist, music AVEEG, obtained, according to the 10-20 international electrode placement system, reformatted digitally into referential and bipolar montages. Data was acquired with a minimum of 21 bipolar connections and sampled at a minimum rate of 250 cycles per second per channel, maximum rate of 450 cycles per second per channel and two channels for EKG. The entire VEEG study was recorded through cable and or radio telemetry for subsequent analysis. Specified epochs of the AVEEG data were identified at the direction of the subject by the depression of a push button by the patient. Each patients event file included data acquired two minutes prior to the push button activation and continuing until two minutes afterwards. AVEEG files were reviewed on [employer] [facility] server, Licensed Software provided by [employer] with a digital high frequency filter set at 70 Hz and a low frequency filter set at 1 Hz with a paper speed of 70mm/s resulting in 10 seconds per digital page. This entire AVEEG was reviewed by the EEG Technologist. Random time samples, random sleep samples, clips, patient initiated push button files with included patient daily diary logs, EEG Technologist pruned data was reviewed and verified for accuracy and validity by the governing reading neurologist in full details. This AEEGV was fully compliant with all requirements for CPT 97500 for setup, patient education, take down  and administered by an EEG technologist.  Long-Term EEG with Video was monitored intermittently by a qualified EEG technologist for the entirety of the recording; quality check-ins were performed at a minimum of every two hours, checking and documenting real-time data and video to assure the integrity and quality of the recording (e.g., camera position, electrode integrity and impedance), and identify the need for maintenance. For intermittent monitoring, an EEG Technologist monitored no more than 12 patients concurrently. Diagnostic video was captured at least 80% of the time during the recording.  PATIENT EVENTS: A button press or notation was made 2 times. Patient log was reviewed with the patient at disconnect with the intent to reconcile events. See reconciled patient log in tech uploads. Name Timestamp Duration PATIENT EVENT - #1 NO BUTTON PRESS. DIARY NOTE: AWAKE, APNEA. EEG SHOWS AWAKE BACKGROUND WITHOUT ICTAL CHANGES. PATIENT IS NOT ON CAMERA.  PATIENT EVENT - #2 NO BUTTON PRESS. DIARY NOTE: AWAKE, APNEA. EEG SHOWS AWAKE BACKGROUND WITHOUT ICTAL CHANGES. PATIENT IS NOT ON CAMERA.  TECHNOLOGIST EVENTS: No clear epileptiform activity was detected by the reviewing neurodiagnostic technologist during the recording for further evaluation.  TIME SAMPLES: 10-minutes of every two hours recorded are reviewed as random time samples.  SLEEP SAMPLES: 5-minutes of every 24 hour recorded sleep cycle are reviewed as random sleep samples  AWAKE: At maximal level of alertness, the posterior dominant background activity was continuous, reactive, low voltage rhythm of 10.5 Hz. This was symmetric, well-modulated, and attenuated with eye opening. Diffuse, symmetric, frontocentral beta range activity was present.  SLEEP: N1 Sleep (Stage 1) was observed and characterized by the disappearance of alpha rhythm and the appearance of vertex activity.   N2 Sleep (Stage 2) was  observed and characterized by  vertex waves, K-complexes, and sleep spindles.  N3 (Stage 3) sleep was observed and characterized by high amplitude Delta activity of 20%.  REM sleep was observed.  EKG: There were no arrhythmias or abnormalities noted during this recording.  Impression: Normal EEG: awake and asleep  Clinical Correlation: This is a normal 3-day ambulatory EEG tracing. No focal abnormalities or epileptiform discharges were seen. There were no electrographic seizures noted. There were a total of 2 events as described above with no changes in EEG background, hence nonepileptic. Please note a normal EEG does not exclude the diagnosis of epilepsy   Cassandra Cleveland, MD [facility]

## 2024-08-14 ENCOUNTER — Encounter (HOSPITAL_COMMUNITY): Payer: Self-pay

## 2024-08-14 ENCOUNTER — Ambulatory Visit (HOSPITAL_COMMUNITY)
Admission: RE | Admit: 2024-08-14 | Discharge: 2024-08-14 | Disposition: A | Discharge status: Home / Self Care | Source: Ambulatory Visit | Attending: Internal Medicine | Admitting: Internal Medicine

## 2024-08-14 VITALS — BP 117/58 | HR 74 | Temp 98.1°F | Resp 19

## 2024-08-14 DIAGNOSIS — R051 Acute cough: Secondary | ICD-10-CM

## 2024-08-14 DIAGNOSIS — J069 Acute upper respiratory infection, unspecified: Secondary | ICD-10-CM | POA: Diagnosis not present

## 2024-08-14 LAB — POC SARS CORONAVIRUS 2 AG -  ED: SARS Coronavirus 2 Ag: NEGATIVE

## 2024-08-14 MED ORDER — PROMETHAZINE-DM 6.25-15 MG/5ML PO SYRP: 5.0000 mL | ORAL_SOLUTION | Freq: Every evening | ORAL | 0 refills | Status: AC | PRN

## 2024-08-14 MED ORDER — GUAIFENESIN ER 600 MG PO TB12: 600.0000 mg | ORAL_TABLET | Freq: Two times a day (BID) | ORAL | 0 refills | Status: AC

## 2024-08-14 NOTE — Discharge Instructions (Signed)
 You have a viral illness which will improve on its own with rest, fluids, and medications to help with your symptoms.  COVID-19 testing is negative.   Tylenol , guaifenesin  (plain mucinex ), and saline nasal sprays may help relieve symptoms.   You may take promethazine  DM cough syrup at bedtime as needed for cough and congestion to help you sleep.  This medication will cause drowsiness so only take this at nighttime.  Two teaspoons of honey in 1 cup of warm water  every 4-6 hours may help with throat pains.  Humidifier in room at nighttime may help soothe cough (clean well daily).   For chest pain, shortness of breath, inability to keep food or fluids down without vomiting, fever that does not respond to tylenol  or motrin , or any other severe symptoms, please go to the ER for further evaluation. Return to urgent care as needed, otherwise follow-up with PCP.

## 2024-08-14 NOTE — ED Provider Notes (Signed)
 [facility]    CSN: [id number] Arrival date & time: 08/14/24  9162      History   Chief Complaint Chief Complaint  Patient presents with   appt 830    HPI Eric Chambers is a 78 y.o. male.   Eric Chambers is a 79 y.o. male presenting for chief complaint of cough, congestion, sore throat, and generalized fatigue that started 4-5 days ago. Cough is productive with clear phlegm.  Phlegm is sometimes green but is mostly clear.  Mucus from nasal congestion is clear.  He was recently on a vacation with others who are sick with similar symptoms.  Denies shortness of breath, chest pain, leg swelling, nausea, vomiting, orthopnea, diarrhea, fever, chills, dizziness, ear pain, headache, and visual disturbance.  History of COPD.  He is taking over-the-counter medications with some relief.     Past Medical History:  Diagnosis Date   Aortic root dilatation (HCC) 04/08/2016   a.) TTE 04/08/2016: Ao root measured 39 mm; b.) TTE 01/12/2018: Ao root measured 38 mm; c.) TTE 06/20/2020: Ao root measured 40 mm, asc Ao 39 mm   Aortic valve regurgitation    Arthritis    Bilateral cataracts    a.) s/p extraction with IOL placement   COPD (chronic obstructive pulmonary disease) (HCC)    Diastolic dysfunction    a.) TTE 04/08/2016: EF 55-60%, triv TR, mod AR, atrial septal aneurysm, G1DD; b.) TTE 01/12/2018: EF 60-65%, mild-mod AoV calcification, mod-sev AR, G1DD; c.) TTE 06/20/2020: EF 65-70%, sev LVH, mild AoV sclerosis with mod AR, triv MR; d.) TTE 03/02/2021: EF >55%, mild LVH, GLS -16.0%, MAC/TAC, trivial TR, moderate AR/PR.   Endocarditis 08/17/2013   Possible vegetation associated with tricuspid valve (seen on post tx echo as well)    Endocarditis 2009   a.) while living in [place]    Enlarged prostate    GERD (gastroesophageal reflux disease)    Heart murmur    think I have one now (08/16/2013)   High cholesterol    Hypertension    PSVT (paroxysmal supraventricular  tachycardia) (HCC) 01/18/2022   a.) Holter 01/18/2022: 16 SVT runs with fastest interval lasting 7 beats with a max rate of 222 bpm, longest lasted 17 beats at a rate of 125 bpm   Recurrent pneumonia    almost once/yr (08/16/2013)   Sleep apnea    don't wear mask (08/16/2013)   Squamous cell carcinoma of skin of scalp    Umbilical hernia    a.) s/p repair in approx 2011    Patient Active Problem List   Diagnosis Date Noted   Acute bronchitis 06/30/2023   Acute bacterial rhinosinusitis 06/30/2023   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   Cough syncope 10/12/2018   Cough variant asthma vs UACS with cough syncope  10/11/2018   Hypokalemia 08/29/2013   Anemia due to acute blood loss 08/29/2013   Diarrhea 08/29/2013   Vomiting 08/29/2013   OSA (obstructive sleep apnea) 08/16/2013   FUO (fever of unknown origin) 08/16/2013   Aortic regurgitation 08/16/2013    Class: Chronic   Pure hypercholesterolemia    Hypertension    Enlarged prostate    Endocarditis     Past Surgical History:  Procedure Laterality Date   CATARACT EXTRACTION W/ INTRAOCULAR LENS IMPLANT Right 07/05/2022   Procedure: CATARACT EXTRACTION W/ INTRAOCULAR LENS IMPLANT; Location: [facility]   CATARACT EXTRACTION W/ INTRAOCULAR LENS IMPLANT Left 07/19/2022   Procedure: CATARACT EXTRACTION W/ INTRAOCULAR LENS IMPLANT; Location: [facility]   HOLEP-LASER ENUCLEATION OF  THE PROSTATE WITH MORCELLATION N/A 07/30/2022   Procedure: HOLEP-LASER ENUCLEATION OF THE PROSTATE WITH MORCELLATION;  Surgeon: Francisca Redell BROCKS, MD;  Location: [facility];  Service: Urology;  Laterality: N/A;   INGUINAL HERNIA REPAIR Right ~ 1950   TEE WITHOUT CARDIOVERSION N/A 08/17/2013   Procedure: TRANSESOPHAGEAL ECHOCARDIOGRAM (TEE);  Surgeon: Oneil Parchment, MD;  Location: [facility];  Service: Cardiovascular;  Laterality: N/A;   TONSILLECTOMY  ~ 1957   TOTAL HIP ARTHROPLASTY Right 03/23/2023   Procedure: TOTAL HIP ARTHROPLASTY;  Surgeon: Edna Toribio LABOR, MD;   Location: [facility];  Service: Orthopedics;  Laterality: Right;   UMBILICAL HERNIA REPAIR  ~ 2011       Home Medications    Prior to Admission medications   Medication Sig Start Date End Date Taking? Authorizing Provider  guaiFENesin  (MUCINEX ) 600 MG 12 hr tablet Take 1 tablet (600 mg total) by mouth 2 (two) times daily. 08/14/24  Yes Enedelia Dorna HERO, FNP  promethazine -dextromethorphan (PROMETHAZINE -DM) 6.25-15 MG/5ML syrup Take 5 mLs by mouth at bedtime as needed for cough. 08/14/24  Yes Enedelia Dorna HERO, FNP  hydrochlorothiazide  (MICROZIDE ) 12.5 MG capsule Take 1 capsule (12.5 mg total) by mouth daily. 03/26/24   Parchment Oneil BROCKS, MD  rosuvastatin (CRESTOR) 10 MG tablet Take 10 mg by mouth daily.    [provider]  telmisartan  (MICARDIS ) 80 MG tablet Take 80 mg by mouth daily. 07/18/23   [provider]    Family History Family History  Problem Relation Age of Onset   Hypertension Mother    Heart attack Father 29    Social History Social History   Tobacco Use   Smoking status: Never    Passive exposure: Never   Smokeless tobacco: Never  Vaping Use   Vaping status: Never Used  Substance Use Topics   Alcohol  use: Yes    Alcohol /week: 2.0 standard drinks of alcohol     Types: 2 Glasses of wine per week    Comment: occasionally   Drug use: No     Allergies   Norvasc  [amlodipine  besylate]   Review of Systems Review of Systems Per HPI  Physical Exam Triage Vital Signs ED Triage Vitals [08/14/24 0857]  Encounter Vitals Group     BP (!) 117/58     Girls Systolic BP Percentile      Girls Diastolic BP Percentile      Boys Systolic BP Percentile      Boys Diastolic BP Percentile      Pulse Rate 74     Resp 19     Temp 98.1 F (36.7 C)     Temp Source Oral     SpO2 97 %     Weight      Height      Head Circumference      Peak Flow      Pain Score 0     Pain Loc      Pain Education      Exclude from Growth Chart    No data  found.  Updated Vital Signs BP (!) 117/58 (BP Location: Left Arm)   Pulse 74   Temp 98.1 F (36.7 C) (Oral)   Resp 19   SpO2 97%   Visual Acuity Right Eye Distance:   Left Eye Distance:   Bilateral Distance:    Right Eye Near:   Left Eye Near:    Bilateral Near:     Physical Exam Vitals and nursing note reviewed.  Constitutional:  Appearance: He is not ill-appearing or toxic-appearing.  HENT:     Head: Normocephalic and atraumatic.     Right Ear: Hearing, tympanic membrane, ear canal and external ear normal.     Left Ear: Hearing, tympanic membrane, ear canal and external ear normal.     Nose: Congestion present.     Mouth/Throat:     Lips: Pink.     Mouth: Mucous membranes are moist. No injury or oral lesions.     Dentition: Normal dentition.     Tongue: No lesions.     Pharynx: Oropharynx is clear. Uvula midline. No pharyngeal swelling, oropharyngeal exudate, posterior oropharyngeal erythema, uvula swelling or postnasal drip.     Tonsils: No tonsillar exudate.  Eyes:     General: Lids are normal. Vision grossly intact. Gaze aligned appropriately.     Extraocular Movements: Extraocular movements intact.     Conjunctiva/sclera: Conjunctivae normal.  Neck:     Trachea: Trachea and phonation normal.  Cardiovascular:     Rate and Rhythm: Normal rate and regular rhythm.     Heart sounds: Normal heart sounds, S1 normal and S2 normal.  Pulmonary:     Effort: Pulmonary effort is normal. No respiratory distress.     Breath sounds: Normal breath sounds and air entry. No wheezing, rhonchi or rales.     Comments: Speaking in full sentences without difficulty.  No adventitious sounds to lungs bilaterally. Chest:     Chest wall: No tenderness.  Musculoskeletal:     Cervical back: Neck supple.  Lymphadenopathy:     Cervical: No cervical adenopathy.  Skin:    General: Skin is warm and dry.     Capillary Refill: Capillary refill takes less than 2 seconds.     Findings: No  rash.  Neurological:     General: No focal deficit present.     Mental Status: He is alert and oriented to person, place, and time. Mental status is at baseline.     Cranial Nerves: No dysarthria or facial asymmetry.  Psychiatric:        Mood and Affect: Mood normal.        Speech: Speech normal.        Behavior: Behavior normal.        Thought Content: Thought content normal.        Judgment: Judgment normal.      UC Treatments / Results  Labs (all labs ordered are listed, but only abnormal results are displayed) Labs Reviewed  POC SARS CORONAVIRUS 2 AG -  ED    EKG   Radiology No results found.  Procedures Procedures (including critical care time)  Medications Ordered in UC Medications - No data to display  Initial Impression / Assessment and Plan / UC Course  I have reviewed the triage vital signs and the nursing notes.  Pertinent labs & imaging results that were available during my care of the patient were reviewed by me and considered in my medical decision making (see chart for details).   1.  Viral URI with cough Suspect viral URI, viral syndrome.  Strep/viral testing: POC COVID-19 testing is negative.  No current clinical signs of COPD exacerbation, mucous is mostly clear, no signs of acute bacterial sinusitis. Patient is requesting azithromycin , there is no clinical indication for this today. Follow-up with PCP or return to urgent care in 3-4 days or sooner if symptoms worsen/fail to improve with recommended treatment plan today.   Physical exam findings reassuring, vital signs hemodynamically  stable, and lungs clear, therefore deferred imaging of the chest.  Advised supportive care/prescriptions for symptomatic relief as outlined in AVS.    Counseled patient on potential for adverse effects with medications prescribed/recommended today, strict ER and return-to-clinic precautions discussed, patient verbalized understanding.    Final Clinical Impressions(s)  / UC Diagnoses   Final diagnoses:  Viral URI with cough     Discharge Instructions      You have a viral illness which will improve on its own with rest, fluids, and medications to help with your symptoms.  COVID-19 testing is negative.   Tylenol , guaifenesin  (plain mucinex ), and saline nasal sprays may help relieve symptoms.   You may take promethazine  DM cough syrup at bedtime as needed for cough and congestion to help you sleep.  This medication will cause drowsiness so only take this at nighttime.  Two teaspoons of honey in 1 cup of warm water  every 4-6 hours may help with throat pains.  Humidifier in room at nighttime may help soothe cough (clean well daily).   For chest pain, shortness of breath, inability to keep food or fluids down without vomiting, fever that does not respond to tylenol  or motrin , or any other severe symptoms, please go to the ER for further evaluation. Return to urgent care as needed, otherwise follow-up with PCP.      ED Prescriptions     Medication Sig Dispense Auth. Provider   promethazine -dextromethorphan (PROMETHAZINE -DM) 6.25-15 MG/5ML syrup Take 5 mLs by mouth at bedtime as needed for cough. 118 mL Enedelia Going M, FNP   guaiFENesin  (MUCINEX ) 600 MG 12 hr tablet Take 1 tablet (600 mg total) by mouth 2 (two) times daily. 30 tablet Enedelia Going HERO, FNP      PDMP not reviewed this encounter.   Enedelia Going [place], [place] 08/14/24 [phone]

## 2024-08-14 NOTE — ED Triage Notes (Signed)
 Pt reports 4-5 days ago had cough and congestion. Reports was on a trip and one person in the group was sick and spread to everyone. Pt reports that Zpack usually helps him when has sinus infections. Taken some Nyquil to help sleep.

## 2024-09-06 ENCOUNTER — Ambulatory Visit: Admitting: Neurology

## 2024-10-26 ENCOUNTER — Encounter (HOSPITAL_COMMUNITY): Payer: Self-pay

## 2024-10-26 ENCOUNTER — Ambulatory Visit (HOSPITAL_COMMUNITY)
Admission: RE | Admit: 2024-10-26 | Discharge: 2024-10-26 | Disposition: A | Discharge status: Home / Self Care | Source: Ambulatory Visit | Attending: Emergency Medicine | Admitting: Emergency Medicine

## 2024-10-26 ENCOUNTER — Ambulatory Visit (INDEPENDENT_AMBULATORY_CARE_PROVIDER_SITE_OTHER)

## 2024-10-26 VITALS — BP 105/63 | HR 67 | Temp 97.6°F | Resp 16

## 2024-10-26 DIAGNOSIS — J441 Chronic obstructive pulmonary disease with (acute) exacerbation: Secondary | ICD-10-CM | POA: Diagnosis not present

## 2024-10-26 DIAGNOSIS — B9789 Other viral agents as the cause of diseases classified elsewhere: Secondary | ICD-10-CM | POA: Diagnosis not present

## 2024-10-26 DIAGNOSIS — J988 Other specified respiratory disorders: Secondary | ICD-10-CM | POA: Diagnosis not present

## 2024-10-26 DIAGNOSIS — R051 Acute cough: Secondary | ICD-10-CM

## 2024-10-26 LAB — POC SOFIA SARS ANTIGEN FIA: SARS Coronavirus 2 Ag: NEGATIVE

## 2024-10-26 MED ORDER — PREDNISONE 20 MG PO TABS: 40.0000 mg | ORAL_TABLET | Freq: Every day | ORAL | 0 refills | Status: AC

## 2024-10-26 MED ORDER — ALBUTEROL SULFATE HFA 108 (90 BASE) MCG/ACT IN AERS: 1.0000 | INHALATION_SPRAY | Freq: Four times a day (QID) | RESPIRATORY_TRACT | 0 refills | Status: AC | PRN

## 2024-10-26 MED ORDER — AZITHROMYCIN 250 MG PO TABS: 250.0000 mg | ORAL_TABLET | Freq: Every day | ORAL | 0 refills | Status: AC

## 2024-10-26 NOTE — ED Triage Notes (Addendum)
 Pt states congestion in his chest for the past 4 days.  States at night when he is sleeping he starts wheezing. States he hasn't been taking anything at home for it.

## 2024-10-26 NOTE — Discharge Instructions (Addendum)
 Your COVID testing is negative.  I believe your symptoms are likely related to a viral respiratory illness that has exacerbated your underlying COPD. Start taking azithromycin  by taking 2 tablets today and 1 tablet for the 4 remaining days for coverage of this. Also take 2 tablets of prednisone  once daily for 5 days which will help with wheezing related to this. You can use 1 to 2 puffs of albuterol  every 6 hours as needed for wheezing or shortness of breath. You can also take over-the-counter Coricidin for cough and congestion.  This medication is safe with your history of high blood pressure. Follow-up with your primary care provider or return here as needed.

## 2024-10-26 NOTE — ED Provider Notes (Signed)
 [facility]    CSN: [id number] Arrival date & time: 10/26/24  9163      History   Chief Complaint Chief Complaint  Patient presents with   chest  congestion    HPI Eric Chambers is a 78 y.o. male.   Patient presents with chest congestion that began about 4 days ago.  Patient states that when he lies down at night he hears some wheezing and rattling in his chest and has difficulty sleeping because of this.  Patient states that he has had a mild cough as well.  Patient denies shortness of breath, chest pain, fever, body aches, chills, or weakness.  Patient reports that his grandchildren have been sick with cold-like symptoms recently.  Patient denies taking any medication for his symptoms.  Patient does have a history of COPD but states that he does not use an inhaler on a daily basis and does not have any rescue inhalers at this time.  The history is provided by the patient and medical records.    Past Medical History:  Diagnosis Date   Aortic root dilatation 04/08/2016   a.) TTE 04/08/2016: Ao root measured 39 mm; b.) TTE 01/12/2018: Ao root measured 38 mm; c.) TTE 06/20/2020: Ao root measured 40 mm, asc Ao 39 mm   Aortic valve regurgitation    Arthritis    Bilateral cataracts    a.) s/p extraction with IOL placement   COPD (chronic obstructive pulmonary disease) (HCC)    Diastolic dysfunction    a.) TTE 04/08/2016: EF 55-60%, triv TR, mod AR, atrial septal aneurysm, G1DD; b.) TTE 01/12/2018: EF 60-65%, mild-mod AoV calcification, mod-sev AR, G1DD; c.) TTE 06/20/2020: EF 65-70%, sev LVH, mild AoV sclerosis with mod AR, triv MR; d.) TTE 03/02/2021: EF >55%, mild LVH, GLS -16.0%, MAC/TAC, trivial TR, moderate AR/PR.   Endocarditis 08/17/2013   Possible vegetation associated with tricuspid valve (seen on post tx echo as well)    Endocarditis 2009   a.) while living in [place]    Enlarged prostate    GERD (gastroesophageal reflux disease)    Heart murmur    think  I have one now (08/16/2013)   High cholesterol    Hypertension    PSVT (paroxysmal supraventricular tachycardia) 01/18/2022   a.) Holter 01/18/2022: 16 SVT runs with fastest interval lasting 7 beats with a max rate of 222 bpm, longest lasted 17 beats at a rate of 125 bpm   Recurrent pneumonia    almost once/yr (08/16/2013)   Sleep apnea    don't wear mask (08/16/2013)   Squamous cell carcinoma of skin of scalp    Umbilical hernia    a.) s/p repair in approx 2011    Patient Active Problem List   Diagnosis Date Noted   Acute bronchitis 06/30/2023   Acute bacterial rhinosinusitis 06/30/2023   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   Cough syncope 10/12/2018   Cough variant asthma vs UACS with cough syncope  10/11/2018   Hypokalemia 08/29/2013   Anemia due to acute blood loss 08/29/2013   Diarrhea 08/29/2013   Vomiting 08/29/2013   OSA (obstructive sleep apnea) 08/16/2013   FUO (fever of unknown origin) 08/16/2013   Aortic regurgitation 08/16/2013    Class: Chronic   Pure hypercholesterolemia    Hypertension    Enlarged prostate    Endocarditis     Past Surgical History:  Procedure Laterality Date   CATARACT EXTRACTION W/ INTRAOCULAR LENS IMPLANT Right 07/05/2022   Procedure: CATARACT EXTRACTION W/ INTRAOCULAR LENS  IMPLANT; Location: [facility]   CATARACT EXTRACTION W/ INTRAOCULAR LENS IMPLANT Left 07/19/2022   Procedure: CATARACT EXTRACTION W/ INTRAOCULAR LENS IMPLANT; Location: [facility]   HOLEP-LASER ENUCLEATION OF THE PROSTATE WITH MORCELLATION N/A 07/30/2022   Procedure: HOLEP-LASER ENUCLEATION OF THE PROSTATE WITH MORCELLATION;  Surgeon: Francisca Redell BROCKS, MD;  Location: [facility];  Service: Urology;  Laterality: N/A;   INGUINAL HERNIA REPAIR Right ~ 1950   TEE WITHOUT CARDIOVERSION N/A 08/17/2013   Procedure: TRANSESOPHAGEAL ECHOCARDIOGRAM (TEE);  Surgeon: Oneil Parchment, MD;  Location: [facility] ENDOSCOPY;  Service: Cardiovascular;  Laterality: N/A;   TONSILLECTOMY  ~ 1957   TOTAL HIP  ARTHROPLASTY Right 03/23/2023   Procedure: TOTAL HIP ARTHROPLASTY;  Surgeon: Edna Toribio LABOR, MD;  Location: [facility];  Service: Orthopedics;  Laterality: Right;   UMBILICAL HERNIA REPAIR  ~ 2011       Home Medications    Prior to Admission medications   Medication Sig Start Date End Date Taking? Authorizing Provider  albuterol  (VENTOLIN  HFA) 108 (90 Base) MCG/ACT inhaler Inhale 1-2 puffs into the lungs every 6 (six) hours as needed for wheezing or shortness of breath. 10/26/24  Yes Johnie, Devario Bucklew A, NP  azithromycin  (ZITHROMAX ) 250 MG tablet Take 1 tablet (250 mg total) by mouth daily. Take first 2 tablets together, then 1 every day until finished. 10/26/24  Yes Johnie, Illyana Schorsch A, NP  predniSONE  (DELTASONE ) 20 MG tablet Take 2 tablets (40 mg total) by mouth daily for 5 days. 10/26/24 10/31/24 Yes Johnie, Berlie Hatchel A, NP  guaiFENesin  (MUCINEX ) 600 MG 12 hr tablet Take 1 tablet (600 mg total) by mouth 2 (two) times daily. 08/14/24   Enedelia Dorna HERO, FNP  hydrochlorothiazide  (MICROZIDE ) 12.5 MG capsule Take 1 capsule (12.5 mg total) by mouth daily. 03/26/24   Parchment Oneil BROCKS, MD  promethazine -dextromethorphan (PROMETHAZINE -DM) 6.25-15 MG/5ML syrup Take 5 mLs by mouth at bedtime as needed for cough. 08/14/24   Enedelia Dorna HERO, FNP  rosuvastatin (CRESTOR) 10 MG tablet Take 10 mg by mouth daily.    [provider]  telmisartan  (MICARDIS ) 80 MG tablet Take 80 mg by mouth daily. 07/18/23   [provider]    Family History Family History  Problem Relation Age of Onset   Hypertension Mother    Heart attack Father 74    Social History Social History   Tobacco Use   Smoking status: Never    Passive exposure: Never   Smokeless tobacco: Never  Vaping Use   Vaping status: Never Used  Substance Use Topics   Alcohol  use: Yes    Alcohol /week: 2.0 standard drinks of alcohol     Types: 2 Glasses of wine per week    Comment: occasionally   Drug use: No      Allergies   Norvasc  [amlodipine  besylate]   Review of Systems Review of Systems  Per HPI  Physical Exam Triage Vital Signs ED Triage Vitals  Encounter Vitals Group     BP 10/26/24 0853 105/63     Girls Systolic BP Percentile --      Girls Diastolic BP Percentile --      Boys Systolic BP Percentile --      Boys Diastolic BP Percentile --      Pulse Rate 10/26/24 0853 67     Resp 10/26/24 0853 16     Temp 10/26/24 0853 97.6 F (36.4 C)     Temp Source 10/26/24 0853 Oral     SpO2 10/26/24 0853 96 %     Weight --  Height --      Head Circumference --      Peak Flow --      Pain Score 10/26/24 0852 0     Pain Loc --      Pain Education --      Exclude from Growth Chart --    No data found.  Updated Vital Signs BP 105/63 (BP Location: Right Arm)   Pulse 67   Temp 97.6 F (36.4 C) (Oral)   Resp 16   SpO2 96%   Visual Acuity Right Eye Distance:   Left Eye Distance:   Bilateral Distance:    Right Eye Near:   Left Eye Near:    Bilateral Near:     Physical Exam Vitals and nursing note reviewed.  Constitutional:      General: He is awake. He is not in acute distress.    Appearance: Normal appearance. He is well-developed and well-groomed. He is not ill-appearing.  HENT:     Right Ear: Tympanic membrane, ear canal and external ear normal.     Left Ear: Tympanic membrane, ear canal and external ear normal.     Nose: Nose normal.     Mouth/Throat:     Mouth: Mucous membranes are moist.     Pharynx: Oropharynx is clear.  Cardiovascular:     Rate and Rhythm: Normal rate and regular rhythm.  Pulmonary:     Effort: Pulmonary effort is normal.     Breath sounds: Normal breath sounds.  Skin:    General: Skin is warm and dry.  Neurological:     Mental Status: He is alert.  Psychiatric:        Behavior: Behavior is cooperative.      UC Treatments / Results  Labs (all labs ordered are listed, but only abnormal results are displayed) Labs Reviewed   POC SOFIA SARS ANTIGEN FIA    EKG   Radiology DG Chest 2 View Result Date: 10/26/2024 EXAM: 2 VIEW(S) XRAY OF THE CHEST 10/26/2024 09:31:35 AM COMPARISON: 03/04/2024 CLINICAL HISTORY: cough x4 days, wheezing when laying done at night FINDINGS: LUNGS AND PLEURA: No focal pulmonary opacity. No pulmonary edema. No pleural effusion. No pneumothorax. HEART AND MEDIASTINUM: No acute abnormality of the cardiac and mediastinal silhouettes. BONES AND SOFT TISSUES: No acute osseous abnormality. IMPRESSION: 1. No acute cardiopulmonary process. Electronically signed by: Lynwood Seip MD 10/26/2024 09:58 AM EST RP Workstation: [id number]    Procedures Procedures (including critical care time)  Medications Ordered in UC Medications - No data to display  Initial Impression / Assessment and Plan / UC Course  I have reviewed the triage vital signs and the nursing notes.  Pertinent labs & imaging results that were available during my care of the patient were reviewed by me and considered in my medical decision making (see chart for details).     Patient is overall well-appearing.  Vitals are stable.  Heart and lung sounds normal.  No wheezing auscultated at this time.  No significant findings on exam.   COVID testing negative.  Chest x-ray ordered to rule out underlying pneumonia.  I independently interpreted these images and there is no active cardiopulmonary disease.  Radiology report confirms this.  Prescribed azithromycin  and prednisone  burst for COPD exacerbation.  Prescribed albuterol  inhaler as needed for wheezing and shortness of breath.  Discussed over-the-counter medications as needed for symptoms as well.  Discussed follow-up and return precautions. Final Clinical Impressions(s) / UC Diagnoses   Final diagnoses:  Acute cough  COPD exacerbation (HCC)  Viral respiratory illness     Discharge Instructions      Your COVID testing is negative.  I believe your symptoms are likely related  to a viral respiratory illness that has exacerbated your underlying COPD. Start taking azithromycin  by taking 2 tablets today and 1 tablet for the 4 remaining days for coverage of this. Also take 2 tablets of prednisone  once daily for 5 days which will help with wheezing related to this. You can use 1 to 2 puffs of albuterol  every 6 hours as needed for wheezing or shortness of breath. You can also take over-the-counter Coricidin for cough and congestion.  This medication is safe with your history of high blood pressure. Follow-up with your primary care provider or return here as needed.     ED Prescriptions     Medication Sig Dispense Auth. Provider   azithromycin  (ZITHROMAX ) 250 MG tablet Take 1 tablet (250 mg total) by mouth daily. Take first 2 tablets together, then 1 every day until finished. 6 tablet Johnie Flaming A, NP   predniSONE  (DELTASONE ) 20 MG tablet Take 2 tablets (40 mg total) by mouth daily for 5 days. 10 tablet Johnie Flaming A, NP   albuterol  (VENTOLIN  HFA) 108 (90 Base) MCG/ACT inhaler Inhale 1-2 puffs into the lungs every 6 (six) hours as needed for wheezing or shortness of breath. 18 g Johnie Flaming A, NP      PDMP not reviewed this encounter.   Johnie Flaming A, NP 10/26/24 [phone]
